# Patient Record
Sex: Female | Born: 1955 | Race: Black or African American | Hispanic: No | Marital: Married | Smoking: Former smoker
Health system: Southern US, Community
[De-identification: ages and names within clinical notes are randomized; demographics above are authoritative.]

## PROBLEM LIST (undated history)

## (undated) DIAGNOSIS — D219 Benign neoplasm of connective and other soft tissue, unspecified: Secondary | ICD-10-CM

## (undated) DIAGNOSIS — E785 Hyperlipidemia, unspecified: Secondary | ICD-10-CM

## (undated) DIAGNOSIS — T7840XA Allergy, unspecified, initial encounter: Secondary | ICD-10-CM

## (undated) DIAGNOSIS — J45909 Unspecified asthma, uncomplicated: Secondary | ICD-10-CM

## (undated) DIAGNOSIS — G4700 Insomnia, unspecified: Secondary | ICD-10-CM

## (undated) DIAGNOSIS — B029 Zoster without complications: Secondary | ICD-10-CM

## (undated) DIAGNOSIS — E079 Disorder of thyroid, unspecified: Secondary | ICD-10-CM

## (undated) DIAGNOSIS — M1990 Unspecified osteoarthritis, unspecified site: Secondary | ICD-10-CM

## (undated) DIAGNOSIS — T8859XA Other complications of anesthesia, initial encounter: Secondary | ICD-10-CM

## (undated) DIAGNOSIS — Z972 Presence of dental prosthetic device (complete) (partial): Secondary | ICD-10-CM

## (undated) DIAGNOSIS — M8580 Other specified disorders of bone density and structure, unspecified site: Secondary | ICD-10-CM

## (undated) DIAGNOSIS — T4145XA Adverse effect of unspecified anesthetic, initial encounter: Secondary | ICD-10-CM

## (undated) DIAGNOSIS — E039 Hypothyroidism, unspecified: Secondary | ICD-10-CM

## (undated) DIAGNOSIS — D649 Anemia, unspecified: Secondary | ICD-10-CM

## (undated) DIAGNOSIS — I10 Essential (primary) hypertension: Secondary | ICD-10-CM

## (undated) HISTORY — DX: Disorder of thyroid, unspecified: E07.9

## (undated) HISTORY — DX: Unspecified asthma, uncomplicated: J45.909

## (undated) HISTORY — PX: TUBAL LIGATION: SHX77

## (undated) HISTORY — PX: ADENOIDECTOMY: SUR15

## (undated) HISTORY — DX: Other specified disorders of bone density and structure, unspecified site: M85.80

## (undated) HISTORY — DX: Insomnia, unspecified: G47.00

## (undated) HISTORY — DX: Zoster without complications: B02.9

## (undated) HISTORY — DX: Hyperlipidemia, unspecified: E78.5

## (undated) HISTORY — DX: Benign neoplasm of connective and other soft tissue, unspecified: D21.9

## (undated) HISTORY — DX: Allergy, unspecified, initial encounter: T78.40XA

---
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
## 2003-11-16 HISTORY — PX: ABDOMINAL HYSTERECTOMY: SHX81

## 2008-08-29 ENCOUNTER — Ambulatory Visit: Payer: Self-pay | Admitting: Family Medicine

## 2010-09-06 ENCOUNTER — Emergency Department: Payer: Self-pay | Admitting: Emergency Medicine

## 2011-02-02 ENCOUNTER — Ambulatory Visit: Payer: Self-pay | Admitting: Family Medicine

## 2011-02-03 LAB — HM MAMMOGRAPHY: HM Mammogram: NORMAL

## 2012-08-22 LAB — HM PAP SMEAR: HM Pap smear: NORMAL

## 2013-06-25 LAB — HM COLONOSCOPY: HM Colonoscopy: NORMAL

## 2013-11-12 ENCOUNTER — Other Ambulatory Visit (INDEPENDENT_AMBULATORY_CARE_PROVIDER_SITE_OTHER): Payer: Self-pay | Admitting: Otolaryngology

## 2013-11-12 DIAGNOSIS — J329 Chronic sinusitis, unspecified: Secondary | ICD-10-CM

## 2013-11-14 ENCOUNTER — Ambulatory Visit
Admission: RE | Admit: 2013-11-14 | Discharge: 2013-11-14 | Disposition: A | Discharge status: Home / Self Care | Payer: BC Managed Care – PPO | Source: Ambulatory Visit | Attending: Otolaryngology | Admitting: Otolaryngology

## 2013-11-14 DIAGNOSIS — J329 Chronic sinusitis, unspecified: Secondary | ICD-10-CM

## 2015-07-23 ENCOUNTER — Other Ambulatory Visit: Payer: Self-pay | Admitting: Family Medicine

## 2015-07-24 ENCOUNTER — Other Ambulatory Visit: Payer: Self-pay

## 2015-07-24 MED ORDER — MONTELUKAST SODIUM 10 MG PO TABS: 10.0000 mg | ORAL_TABLET | Freq: Every day | ORAL | Status: DC

## 2015-09-19 ENCOUNTER — Other Ambulatory Visit
Admission: RE | Admit: 2015-09-19 | Discharge: 2015-09-19 | Disposition: A | Discharge status: Home / Self Care | Payer: BC Managed Care – PPO | Source: Ambulatory Visit | Attending: Family Medicine | Admitting: Family Medicine

## 2015-09-19 ENCOUNTER — Encounter: Payer: Self-pay | Admitting: Family Medicine

## 2015-09-19 ENCOUNTER — Ambulatory Visit (INDEPENDENT_AMBULATORY_CARE_PROVIDER_SITE_OTHER): Payer: BC Managed Care – PPO | Admitting: Family Medicine

## 2015-09-19 VITALS — BP 118/62 | HR 81 | Temp 98.1°F | Resp 16 | Ht 63.0 in | Wt 118.0 lb

## 2015-09-19 DIAGNOSIS — J4540 Moderate persistent asthma, uncomplicated: Secondary | ICD-10-CM | POA: Diagnosis not present

## 2015-09-19 DIAGNOSIS — R0789 Other chest pain: Secondary | ICD-10-CM | POA: Insufficient documentation

## 2015-09-19 DIAGNOSIS — J302 Other seasonal allergic rhinitis: Secondary | ICD-10-CM

## 2015-09-19 DIAGNOSIS — J309 Allergic rhinitis, unspecified: Secondary | ICD-10-CM

## 2015-09-19 DIAGNOSIS — Z23 Encounter for immunization: Secondary | ICD-10-CM | POA: Diagnosis not present

## 2015-09-19 DIAGNOSIS — E038 Other specified hypothyroidism: Secondary | ICD-10-CM | POA: Insufficient documentation

## 2015-09-19 DIAGNOSIS — Z13220 Encounter for screening for lipoid disorders: Secondary | ICD-10-CM | POA: Diagnosis not present

## 2015-09-19 DIAGNOSIS — E039 Hypothyroidism, unspecified: Secondary | ICD-10-CM | POA: Insufficient documentation

## 2015-09-19 DIAGNOSIS — J3089 Other allergic rhinitis: Secondary | ICD-10-CM

## 2015-09-19 DIAGNOSIS — F325 Major depressive disorder, single episode, in full remission: Secondary | ICD-10-CM | POA: Insufficient documentation

## 2015-09-19 LAB — CBC WITH DIFFERENTIAL/PLATELET
Basophils Absolute: 0 10*3/uL (ref 0–0.1)
Basophils Relative: 1 %
Eosinophils Absolute: 0.4 10*3/uL (ref 0–0.7)
Eosinophils Relative: 9 %
HCT: 35.3 % (ref 35.0–47.0)
Hemoglobin: 11.3 g/dL — ABNORMAL LOW (ref 12.0–16.0)
Lymphocytes Relative: 50 %
Lymphs Abs: 2.1 10*3/uL (ref 1.0–3.6)
MCH: 26.7 pg (ref 26.0–34.0)
MCHC: 32.1 g/dL (ref 32.0–36.0)
MCV: 82.9 fL (ref 80.0–100.0)
Monocytes Absolute: 0.2 10*3/uL (ref 0.2–0.9)
Monocytes Relative: 5 %
Neutro Abs: 1.5 10*3/uL (ref 1.4–6.5)
Neutrophils Relative %: 35 %
Platelets: 192 10*3/uL (ref 150–440)
RBC: 4.25 MIL/uL (ref 3.80–5.20)
RDW: 13.7 % (ref 11.5–14.5)
WBC: 4.2 10*3/uL (ref 3.6–11.0)

## 2015-09-19 LAB — COMPREHENSIVE METABOLIC PANEL
ALT: 19 U/L (ref 14–54)
AST: 28 U/L (ref 15–41)
Albumin: 4.3 g/dL (ref 3.5–5.0)
Alkaline Phosphatase: 47 U/L (ref 38–126)
Anion gap: 8 (ref 5–15)
BUN: 11 mg/dL (ref 6–20)
CO2: 28 mmol/L (ref 22–32)
Calcium: 9.2 mg/dL (ref 8.9–10.3)
Chloride: 106 mmol/L (ref 101–111)
Creatinine, Ser: 0.87 mg/dL (ref 0.44–1.00)
GFR calc Af Amer: >60 mL/min (ref >60)
GFR calc non Af Amer: >60 mL/min (ref >60)
Glucose, Bld: 103 mg/dL — ABNORMAL HIGH (ref 65–99)
Potassium: 3.3 mmol/L — ABNORMAL LOW (ref 3.5–5.1)
Sodium: 142 mmol/L (ref 135–145)
Total Bilirubin: 0.4 mg/dL (ref 0.3–1.2)
Total Protein: 7.6 g/dL (ref 6.5–8.1)

## 2015-09-19 LAB — LIPID PANEL
Cholesterol: 267 mg/dL — ABNORMAL HIGH (ref 0–200)
HDL: 91 mg/dL (ref >40)
LDL Cholesterol: 163 mg/dL — ABNORMAL HIGH (ref 0–99)
Total CHOL/HDL Ratio: 2.9 RATIO
Triglycerides: 64 mg/dL (ref <150)
VLDL: 13 mg/dL (ref 0–40)

## 2015-09-19 LAB — TSH: TSH: 6.324 u[IU]/mL — ABNORMAL HIGH (ref 0.350–4.500)

## 2015-09-19 LAB — TROPONIN I: Troponin I: <0.03 ng/mL (ref <0.031)

## 2015-09-19 LAB — CKMB (ARMC ONLY): CK, MB: 1.5 ng/mL (ref 0.5–5.0)

## 2015-09-19 LAB — CK: Total CK: 183 U/L (ref 38–234)

## 2015-09-19 MED ORDER — ALBUTEROL SULFATE HFA 108 (90 BASE) MCG/ACT IN AERS: 2.0000 | INHALATION_SPRAY | Freq: Four times a day (QID) | RESPIRATORY_TRACT | Status: DC | PRN

## 2015-09-19 MED ORDER — LORATADINE 10 MG PO TABS: 10.0000 mg | ORAL_TABLET | Freq: Every day | ORAL | Status: DC

## 2015-09-19 MED ORDER — MONTELUKAST SODIUM 10 MG PO TABS: 10.0000 mg | ORAL_TABLET | Freq: Every day | ORAL | Status: DC

## 2015-09-19 MED ORDER — ALBUTEROL SULFATE (2.5 MG/3ML) 0.083% IN NEBU
2.5000 mg | INHALATION_SOLUTION | Freq: Once | RESPIRATORY_TRACT | Status: AC
  Administered 2015-09-19: 2.5 mg via RESPIRATORY_TRACT

## 2015-09-19 MED ORDER — FLUTICASONE FUROATE-VILANTEROL 100-25 MCG/INH IN AEPB: 1.0000 | INHALATION_SPRAY | Freq: Every day | RESPIRATORY_TRACT | Status: DC

## 2015-09-19 NOTE — Progress Notes (Signed)
Name: Miranda Stafford   MRN: [id number]    DOB: 08-Feb-1956   Date:09/19/2015       Progress Note  Subjective  Chief Complaint  Chief Complaint  Patient presents with  . URI    onset 3 days coughing, SOB,nausea,chest tightness and achy    HPI  Chest pain: she has noticed that over the past couple of months her asthma has been worse, getting up in the middle of the night with dry cough or SOB and has to use a rescue inhaler, SOB with activity such as walking at work, she also has noticed nocturnal wheezing. She was outdoors in an [employer] two days ago and when she got back home, she was having chest tightness, SOB, wheezing, pain on her shoulder blades and back. She did not go to the [facility]. She states she was feeling disoriented, nausea, and had some diaphoresis. She did not try her inhaler because she was worried about having a heart attack. No edema. Explained the importance of going to [facility] if symptoms recurs.    09/19/15 0905  Asthma History  Symptoms Daily  Nighttime Awakenings >1/wk but not nightly  Asthma interference with normal activity Some limitations  SABA use (not for EIB) Daily  Risk: Exacerbations requiring oral systemic steroids 0-1 / year  Asthma Severity Moderate Persistent   Hypothyroidism: she has been taking her medication three times weekly on M, W, F otherwise she felt it was too much for her, because it was causing palpitation and some anxiety at a higher dose  AR: having nasal congestion, post-nasal drainage, no sneezing, but has an itchy throat  Patient Active Problem List   Diagnosis Date Noted  . Hypothyroidism 09/19/2015  . Asthma, moderate persistent, poorly-controlled 09/19/2015  . Perennial allergic rhinitis with seasonal variation 09/19/2015  . Major depression in remission ([place]) 09/19/2015    Past Surgical History  Procedure Laterality Date  . Abdominal hysterectomy      Family History  Problem Relation Age of Onset  . Heart disease Mother   .  Hypertension Mother   . Hyperlipidemia Mother     Social History   Social History  . Marital Status: Married    Spouse Name: N/A  . Number of Children: N/A  . Years of Education: N/A   Occupational History  . Not on file.   Social History Main Topics  . Smoking status: Former Smoker -- 0.50 packs/day for 5 years    Types: Cigarettes    Quit date: 09/18/1985  . Smokeless tobacco: Never Used  . Alcohol Use: 0.0 oz/week    0 Standard drinks or equivalent per week     Comment: wine  . Drug Use: No  . Sexual Activity: Yes   Other Topics Concern  . Not on file   Social History Narrative  . No narrative on file     Current outpatient prescriptions:  .  montelukast (SINGULAIR) 10 MG tablet, Take 1 tablet (10 mg total) by mouth at bedtime., Disp: 30 tablet, Rfl: 0 .  SYNTHROID 125 MCG tablet, Place 1 tablet into the left eye daily., Disp: , Rfl: 0  No Known Allergies   ROS  Constitutional: Negative for fever or weight change.  Respiratory: Positive for cough and shortness of breath.   Cardiovascular: Positive  for chest pain no  palpitations.  Gastrointestinal: Negative for abdominal pain, no bowel changes.  Musculoskeletal: Negative for gait problem or joint swelling.  Skin: Negative for rash.  Neurological: Negative for  dizziness or headache.  No other specific complaints in a complete review of systems (except as listed in HPI above). Objective  Filed Vitals:   09/19/15 0823  BP: 118/62  Pulse: 81  Temp: 98.1 F (36.7 C)  TempSrc: Oral  Resp: 16  Height: 5\' 3"  (1.6 m)  Weight: 118 lb (53.524 kg)  SpO2: 96%    Body mass index is 20.91 kg/(m^2).  Physical Exam  Constitutional: Patient appears well-developed and well-nourished.  No distress.  HEENT: head atraumatic, normocephalic, pupils equal and reactive to light,  neck supple, throat within normal limits Cardiovascular: Normal rate, regular rhythm and normal heart sounds.  No murmur heard. No BLE  edema. Pulmonary/Chest: Effort normal and breath sounds normal. No respiratory distress. Abdominal: Soft.  There is no tenderness. Psychiatric: Patient has a normal mood and affect. behavior is normal. Judgment and thought content normal.  PHQ2/9: Depression screen [facility] 2/9 09/19/2015 09/19/2015  Decreased Interest 0 0  Down, Depressed, Hopeless 0 0  PHQ - 2 Score 0 0    Fall Risk: Fall Risk  09/19/2015  Falls in the past year? No    Functional Status Survey: Is the patient deaf or have difficulty hearing?: No Does the patient have difficulty seeing, even when wearing glasses/contacts?: Yes (glasses) Does the patient have difficulty concentrating, remembering, or making decisions?: No Does the patient have difficulty walking or climbing stairs?: No Does the patient have difficulty dressing or bathing?: No Does the patient have difficulty doing errands alone such as visiting a doctor's office or shopping?: No   Assessment & Plan  1. Chest tightness  Likely from asthma exacerbation since great response to albuterol therapy, but we will check cardiac enzymes to rule out Mi two days ago. If positive enzymes refer to Cardiologist - EKG 12-Lead - Troponin I - Comprehensive metabolic panel - CBC with Differential/Platelet - CK total and CKMB (cardiac)not at [facility]  2. Other specified hypothyroidism  - TSH  3. Asthma, moderate persistent, poorly-controlled  Start maintenance medication and follow up in 1 month - montelukast (SINGULAIR) 10 MG tablet; Take 1 tablet (10 mg total) by mouth at bedtime.  Dispense: 30 tablet; Refill: 5 - Fluticasone Furoate-Vilanterol (BREO ELLIPTA) 100-25 MCG/INH AEPB; Inhale 1 puff into the lungs daily.  Dispense: 60 each; Refill: 2 - albuterol (PROVENTIL HFA;VENTOLIN HFA) 108 (90 BASE) MCG/ACT inhaler; Inhale 2 puffs into the lungs every 6 (six) hours as needed for wheezing or shortness of breath.  Dispense: 1 Inhaler; Refill: 1  4. Lipid screening  -  Lipid panel  5. Needs flu shot  - Flu Vaccine QUAD 36+ mos IM -refused  6. Need for pneumococcal vaccination  - Pneumococcal polysaccharide vaccine 23-valent greater than or equal to 2yo subcutaneous/IM -refused  7. Perennial allergic rhinitis with seasonal variation  - loratadine (CLARITIN) 10 MG tablet; Take 1 tablet (10 mg total) by mouth daily.  Dispense: 30 tablet; Refill: 11

## 2015-09-21 ENCOUNTER — Other Ambulatory Visit: Payer: Self-pay | Admitting: Family Medicine

## 2015-09-21 DIAGNOSIS — D649 Anemia, unspecified: Secondary | ICD-10-CM

## 2015-09-23 ENCOUNTER — Other Ambulatory Visit: Payer: Self-pay

## 2015-09-23 DIAGNOSIS — E038 Other specified hypothyroidism: Secondary | ICD-10-CM

## 2015-11-03 ENCOUNTER — Other Ambulatory Visit: Payer: Self-pay | Admitting: Family Medicine

## 2016-05-21 ENCOUNTER — Other Ambulatory Visit: Payer: Self-pay | Admitting: Family Medicine

## 2016-05-21 NOTE — Telephone Encounter (Signed)
Left voice message informing patient that prescriptions has been sent to pharmacy and for her to call the office to schedule appointment.

## 2016-05-24 ENCOUNTER — Encounter: Payer: Self-pay | Admitting: Family Medicine

## 2016-05-24 ENCOUNTER — Ambulatory Visit (INDEPENDENT_AMBULATORY_CARE_PROVIDER_SITE_OTHER): Payer: BC Managed Care – PPO | Admitting: Family Medicine

## 2016-05-24 VITALS — BP 110/72 | HR 65 | Temp 98.5°F | Resp 16 | Ht 63.0 in | Wt 116.6 lb

## 2016-05-24 DIAGNOSIS — Z803 Family history of malignant neoplasm of breast: Secondary | ICD-10-CM

## 2016-05-24 DIAGNOSIS — Z13220 Encounter for screening for lipoid disorders: Secondary | ICD-10-CM

## 2016-05-24 DIAGNOSIS — E038 Other specified hypothyroidism: Secondary | ICD-10-CM | POA: Diagnosis not present

## 2016-05-24 DIAGNOSIS — Z79899 Other long term (current) drug therapy: Secondary | ICD-10-CM | POA: Diagnosis not present

## 2016-05-24 DIAGNOSIS — D649 Anemia, unspecified: Secondary | ICD-10-CM

## 2016-05-24 DIAGNOSIS — J4540 Moderate persistent asthma, uncomplicated: Secondary | ICD-10-CM | POA: Diagnosis not present

## 2016-05-24 DIAGNOSIS — Z1239 Encounter for other screening for malignant neoplasm of breast: Secondary | ICD-10-CM

## 2016-05-24 MED ORDER — FLUTICASONE FUROATE-VILANTEROL 100-25 MCG/INH IN AEPB: 1.0000 | INHALATION_SPRAY | Freq: Every day | RESPIRATORY_TRACT | Status: DC

## 2016-05-24 MED ORDER — MONTELUKAST SODIUM 10 MG PO TABS
10.0000 mg | ORAL_TABLET | Freq: Every day | ORAL | Status: DC
Start: 2016-05-24 — End: 2017-02-10

## 2016-05-24 NOTE — Progress Notes (Signed)
Name: Miranda Stafford   MRN: [id number]    DOB: 1956-07-11   Date:05/24/2016       Progress Note  Subjective  Chief Complaint  Chief Complaint  Patient presents with  . Thyroid Problem  . Labs Only  . Medication Refill    singular  . Orders    mammogram    HPI  Hypothyroidism: she has been taking her medication three times weekly on M, W, F otherwise she felt it was too much for her, because it was causing palpitation, body aches  and some anxiety at a higher dose. She is on 125 mcg daily. She denies diarrhea or dry skin  AR: she denies  nasal congestion,or sneezing, she has some am nasal drainage. Taking medication as prescribed. Occasionally post-nasal drainage and itchy throat at night.   Family history of breast cancer: her older sister was diagnosed with breast cancer 11 years ago and had recurrence 3 years ago, over the past few months she had another recurrence and is now at a hospice home. She has been taking care of her and is sad to see her sister dying. Explained importance of getting her own mammogram done.   Asthma Mild persistent: she has some SOB a few nights a week, only using an inhaler - that is brown ( possibly Qvar )  about 3 times weekly, explained that medication only lasts 60 days. She needs to call me back with the name of medication, she states [facility] was too expensive   Patient Active Problem List   Diagnosis Date Noted  . Family history of breast cancer in sister 05/24/2016  . Hypothyroidism 09/19/2015  . Asthma, moderate persistent, poorly-controlled 09/19/2015  . Perennial allergic rhinitis with seasonal variation 09/19/2015  . Major depression in remission ([place]) 09/19/2015    Past Surgical History  Procedure Laterality Date  . Abdominal hysterectomy    . Adenoidectomy    . Cesarean section    . Tubal ligation      Family History  Problem Relation Age of Onset  . Heart disease Mother   . Hypertension Mother   . Hyperlipidemia Mother   . SIDS  Son     Social History   Social History  . Marital Status: Married    Spouse Name: N/A  . Number of Children: N/A  . Years of Education: N/A   Occupational History  . Not on file.   Social History Main Topics  . Smoking status: Former Smoker -- 0.50 packs/day for 5 years    Types: Cigarettes    Quit date: 09/18/1985  . Smokeless tobacco: Never Used  . Alcohol Use: 0.0 oz/week    0 Standard drinks or equivalent per week     Comment: wine  . Drug Use: No  . Sexual Activity: Yes   Other Topics Concern  . Not on file   Social History Narrative     Current outpatient prescriptions:  .  albuterol (PROVENTIL HFA;VENTOLIN HFA) 108 (90 BASE) MCG/ACT inhaler, Inhale 2 puffs into the lungs every 6 (six) hours as needed for wheezing or shortness of breath., Disp: 1 Inhaler, Rfl: 1 .  fluticasone furoate-vilanterol (BREO ELLIPTA) 100-25 MCG/INH AEPB, Inhale 1 puff into the lungs daily., Disp: 60 each, Rfl: 2 .  loratadine (CLARITIN) 10 MG tablet, Take 1 tablet (10 mg total) by mouth daily., Disp: 30 tablet, Rfl: 11 .  SYNTHROID 125 MCG tablet, take 1 tablet by mouth once daily, Disp: 30 tablet, Rfl: 0 .  montelukast (  SINGULAIR) 10 MG tablet, Take 1 tablet (10 mg total) by mouth at bedtime., Disp: 30 tablet, Rfl: 5  No Known Allergies   ROS  Constitutional: Negative for fever or weight change.  Respiratory: Negative for cough and shortness of breath.   Cardiovascular: Negative for chest pain or palpitations.  Gastrointestinal: Negative for abdominal pain, no bowel changes.  Musculoskeletal: Negative for gait problem or joint swelling.  Skin: Negative for rash.  Neurological: Negative for dizziness or headache.  No other specific complaints in a complete review of systems (except as listed in HPI above).  Objective  Filed Vitals:   05/24/16 0856  BP: 110/72  Pulse: 65  Temp: 98.5 F (36.9 C)  TempSrc: Oral  Resp: 16  Height: 5\' 3"  (1.6 m)  Weight: 116 lb 9.6 oz  (52.889 kg)  SpO2: 96%    Body mass index is 20.66 kg/(m^2).  Physical Exam  Constitutional: Patient appears well-developed and well-nourished.  No distress.  HEENT: head atraumatic, normocephalic, pupils equal and reactive to light,  neck supple, throat within normal limits Cardiovascular: Normal rate, regular rhythm and normal heart sounds.  No murmur heard. No BLE edema. Pulmonary/Chest: Effort normal and breath sounds normal. No respiratory distress. Abdominal: Soft.  There is no tenderness. Psychiatric: Patient has a normal mood and affect. behavior is normal. Judgment and thought content normal.  PHQ2/9: Depression screen [facility] 2/9 05/24/2016 09/19/2015 09/19/2015  Decreased Interest 0 0 0  Down, Depressed, Hopeless 0 0 0  PHQ - 2 Score 0 0 0    Fall Risk: Fall Risk  05/24/2016 09/19/2015  Falls in the past year? No No    Functional Status Survey: Is the patient deaf or have difficulty hearing?: No Does the patient have difficulty seeing, even when wearing glasses/contacts?: No Does the patient have difficulty concentrating, remembering, or making decisions?: No Does the patient have difficulty walking or climbing stairs?: No Does the patient have difficulty dressing or bathing?: No Does the patient have difficulty doing errands alone such as visiting a doctor's office or shopping?: No    Assessment & Plan  1. Other specified hypothyroidism  - TSH  2. Family history of breast cancer in sister  Needs to have mammogram done  3. Asthma, moderate persistent, poorly-controlled  - montelukast (SINGULAIR) 10 MG tablet; Take 1 tablet (10 mg total) by mouth at bedtime.  Dispense: 30 tablet; Refill: 5  4. Lipid screening  - Lipid panel  5. Anemia, unspecified anemia type  - CBC with Differential/Platelet  6. Encounter for long-term (current) use of medications  - Comprehensive metabolic panel  7. Breast cancer screening  - MM Digital Screening; Future

## 2016-06-10 ENCOUNTER — Encounter: Payer: Self-pay | Admitting: Emergency Medicine

## 2016-06-10 ENCOUNTER — Ambulatory Visit: Payer: BC Managed Care – PPO

## 2016-06-10 ENCOUNTER — Emergency Department
Admission: EM | Admit: 2016-06-10 | Discharge: 2016-06-10 | Disposition: A | Discharge status: Home / Self Care | Payer: BC Managed Care – PPO | Attending: Emergency Medicine | Admitting: Emergency Medicine

## 2016-06-10 DIAGNOSIS — J4540 Moderate persistent asthma, uncomplicated: Secondary | ICD-10-CM | POA: Diagnosis not present

## 2016-06-10 DIAGNOSIS — Y9389 Activity, other specified: Secondary | ICD-10-CM | POA: Diagnosis not present

## 2016-06-10 DIAGNOSIS — W268XXA Contact with other sharp object(s), not elsewhere classified, initial encounter: Secondary | ICD-10-CM | POA: Diagnosis not present

## 2016-06-10 DIAGNOSIS — S61011A Laceration without foreign body of right thumb without damage to nail, initial encounter: Secondary | ICD-10-CM | POA: Diagnosis present

## 2016-06-10 DIAGNOSIS — Y929 Unspecified place or not applicable: Secondary | ICD-10-CM | POA: Insufficient documentation

## 2016-06-10 DIAGNOSIS — Y999 Unspecified external cause status: Secondary | ICD-10-CM | POA: Insufficient documentation

## 2016-06-10 DIAGNOSIS — Z87891 Personal history of nicotine dependence: Secondary | ICD-10-CM | POA: Insufficient documentation

## 2016-06-10 DIAGNOSIS — S61219A Laceration without foreign body of unspecified finger without damage to nail, initial encounter: Secondary | ICD-10-CM

## 2016-06-10 MED ORDER — BACITRACIN ZINC 500 UNIT/GM EX OINT
1.0000 "application " | TOPICAL_OINTMENT | Freq: Two times a day (BID) | CUTANEOUS | Status: DC
  Filled 2016-06-10: qty 0.9

## 2016-06-10 MED ORDER — LIDOCAINE HCL (PF) 1 % IJ SOLN
5.0000 mL | Freq: Once | INTRAMUSCULAR | Status: AC
  Administered 2016-06-10: 5 mL via INTRADERMAL
  Filled 2016-06-10: qty 5

## 2016-06-10 MED ORDER — TRAMADOL HCL 50 MG PO TABS: 50.0000 mg | ORAL_TABLET | Freq: Four times a day (QID) | ORAL | 0 refills | Status: DC | PRN

## 2016-06-10 NOTE — ED Provider Notes (Signed)
[facility] Emergency Department Provider Note  ____________________________________________  Time seen: Approximately 3:02 PM  I have reviewed the triage vital signs and the nursing notes.   HISTORY  Chief Complaint Extremity Laceration   HPI Miranda Stafford is a 60 y.o. female who presents to the emergency department for evaluation of a laceration to the right thumb. She cut her right thumb while opening a can of soup. Active bleeding present. Tetanus vaccination is current.  Past Medical History:  Diagnosis Date  . Allergy   . Asthma   . Depression   . Fibroid tumor   . Herpes zoster   . Hyperlipidemia   . Insomnia   . Osteopenia   . Thyroid disease     Patient Active Problem List   Diagnosis Date Noted  . Family history of breast cancer in sister 05/24/2016  . Hypothyroidism 09/19/2015  . Asthma, moderate persistent, poorly-controlled 09/19/2015  . Perennial allergic rhinitis with seasonal variation 09/19/2015  . Major depression in remission ([place]) 09/19/2015    Past Surgical History:  Procedure Laterality Date  . ABDOMINAL HYSTERECTOMY    . ADENOIDECTOMY    . CESAREAN SECTION    . TUBAL LIGATION      Current Outpatient Rx  . Order #: [id number] Class: Normal  . Order #: [id number] Class: Normal  . Order #: [id number] Class: Normal  . Order #: [id number] Class: Normal  . Order #: [id number] Class: Print    Allergies Review of patient's allergies indicates no known allergies.  Family History  Problem Relation Age of Onset  . Heart disease Mother   . Hypertension Mother   . Hyperlipidemia Mother   . SIDS Son     Social History Social History  Substance Use Topics  . Smoking status: Former Smoker    Packs/day: 0.50    Years: 5.00    Types: Cigarettes    Quit date: 09/18/1985  . Smokeless tobacco: Never Used  . Alcohol use 0.0 oz/week     Comment: wine    Review of Systems  Constitutional: Well appearing. Respiratory:  Negative for shortness of breath. Musculoskeletal: Positive for pain. Skin: Positive for laceration Neurological: Negative for headaches, focal weakness or numbness. ____________________________________________   PHYSICAL EXAM:  VITAL SIGNS: ED Triage Vitals  Enc Vitals Group     BP 06/10/16 1434 117/69     Pulse Rate 06/10/16 1434 81     Resp 06/10/16 1434 18     Temp 06/10/16 1434 97.5 F (36.4 C)     Temp Source 06/10/16 1434 Oral     SpO2 06/10/16 1434 100 %     Weight 06/10/16 1434 118 lb (53.5 kg)     Height 06/10/16 1434 5\' 3"  (1.6 m)     Head Circumference --      Peak Flow --      Pain Score 06/10/16 1435 7     Pain Loc --      Pain Edu? --      Excl. in [place]? --      Constitutional: Alert and oriented. Well appearing and in no acute distress. Eyes: Conjunctivae are normal. EOMI. Nose: No congestion/rhinnorhea. Mouth/Throat: Mucous membranes are moist.   Neck: No stridor. Cardiovascular: Good peripheral circulation. Respiratory: Normal respiratory effort.  No retractions. Musculoskeletal: FROM throughout, specifically the right thumb. Neurologic:  Normal speech and language. No gross focal neurologic deficits are appreciated. Skin:  2.5 cm laceration to the lateral aspect of the right thumb extending up the finger pad and to  the tip of the thumb.  ____________________________________________   LABS (all labs ordered are listed, but only abnormal results are displayed)  Labs Reviewed - No data to display ____________________________________________  EKG   ____________________________________________  RADIOLOGY  Not indicated. ____________________________________________   PROCEDURES  Procedure(s) performed:   LACERATION REPAIR Performed by: Sherrie George  Consent: Verbal consent obtained.  Consent given by: patient  Prepped and Draped in normal sterile fashion  Wound explored: Negative for foreign bodies   Laceration Location: right  thumb  Laceration Length: 2.5cm  Local anesthetic: lidocaine 1% without epinephrine  Anesthetic total: 3.5 ml  Irrigation method: syringe  Amount of cleaning: Standard  Skin closure: 5-0 Nylon  Number of sutures: 6  Technique: Simple interrupted.  Patient tolerance: Patient tolerated the procedure well with no immediate complications.  ____________________________________________   INITIAL IMPRESSION / ASSESSMENT AND PLAN / ED COURSE  Pertinent labs & imaging results that were available during my care of the patient were reviewed by me and considered in my medical decision making (see chart for details).  She was advised to keep the sutured area clean and dry for the next 24 hours. She was then advised that she may perform her ADLs as usual, except for soaking her hand in water. She was advised to follow-up with her primary care provider in 10 days to have sutures removed. She is to return to the ER for symptoms of concern if unable to see her PCP right away.   New Prescriptions   TRAMADOL (ULTRAM) 50 MG TABLET    Take 1 tablet (50 mg total) by mouth every 6 (six) hours as needed.   ____________________________________________   FINAL CLINICAL IMPRESSION(S) / ED DIAGNOSES  Final diagnoses:  Laceration of finger, initial encounter    New Prescriptions   TRAMADOL (ULTRAM) 50 MG TABLET    Take 1 tablet (50 mg total) by mouth every 6 (six) hours as needed.      Victorino Dike, FNP 06/10/16 1634    Hinda Kehr, MD 06/10/16 2321

## 2016-06-10 NOTE — ED Triage Notes (Signed)
Patient presents to the ED with a laceration to her right thumb after cutting thumb on a soup can.  Laceration is about 1.5-2inches extending beyond the most distal thumb joint.  Blood is oozing from wound.

## 2016-06-10 NOTE — ED Notes (Signed)
See triage note.laceration noted to right thumb from soup can

## 2016-06-23 ENCOUNTER — Other Ambulatory Visit: Payer: Self-pay | Admitting: Family Medicine

## 2016-06-23 ENCOUNTER — Ambulatory Visit
Admission: RE | Admit: 2016-06-23 | Discharge: 2016-06-23 | Disposition: A | Discharge status: Home / Self Care | Payer: BC Managed Care – PPO | Source: Ambulatory Visit | Attending: Family Medicine | Admitting: Family Medicine

## 2016-06-23 DIAGNOSIS — Z1239 Encounter for other screening for malignant neoplasm of breast: Secondary | ICD-10-CM | POA: Diagnosis not present

## 2016-06-23 DIAGNOSIS — Z1231 Encounter for screening mammogram for malignant neoplasm of breast: Secondary | ICD-10-CM | POA: Diagnosis present

## 2016-10-18 ENCOUNTER — Ambulatory Visit (INDEPENDENT_AMBULATORY_CARE_PROVIDER_SITE_OTHER): Payer: BC Managed Care – PPO | Admitting: Family Medicine

## 2016-10-18 ENCOUNTER — Encounter: Payer: Self-pay | Admitting: Family Medicine

## 2016-10-18 VITALS — BP 116/78 | HR 81 | Temp 98.9°F | Resp 16 | Ht 63.0 in | Wt 121.2 lb

## 2016-10-18 DIAGNOSIS — Z79899 Other long term (current) drug therapy: Secondary | ICD-10-CM

## 2016-10-18 DIAGNOSIS — E038 Other specified hypothyroidism: Secondary | ICD-10-CM | POA: Diagnosis not present

## 2016-10-18 DIAGNOSIS — R058 Other specified cough: Secondary | ICD-10-CM

## 2016-10-18 DIAGNOSIS — R05 Cough: Secondary | ICD-10-CM | POA: Diagnosis not present

## 2016-10-18 DIAGNOSIS — J4540 Moderate persistent asthma, uncomplicated: Secondary | ICD-10-CM | POA: Diagnosis not present

## 2016-10-18 DIAGNOSIS — J069 Acute upper respiratory infection, unspecified: Secondary | ICD-10-CM | POA: Diagnosis not present

## 2016-10-18 DIAGNOSIS — B9789 Other viral agents as the cause of diseases classified elsewhere: Secondary | ICD-10-CM

## 2016-10-18 LAB — COMPLETE METABOLIC PANEL WITH GFR
ALT: 20 U/L (ref 6–29)
AST: 28 U/L (ref 10–35)
Albumin: 4.6 g/dL (ref 3.6–5.1)
Alkaline Phosphatase: 60 U/L (ref 33–130)
BUN: 9 mg/dL (ref 7–25)
CO2: 29 mmol/L (ref 20–31)
Calcium: 9.7 mg/dL (ref 8.6–10.4)
Chloride: 104 mmol/L (ref 98–110)
Creat: 0.95 mg/dL (ref 0.50–1.05)
GFR, Est African American: 76 mL/min (ref >=60)
GFR, Est Non African American: 66 mL/min (ref >=60)
Glucose, Bld: 86 mg/dL (ref 65–99)
Potassium: 4.2 mmol/L (ref 3.5–5.3)
Sodium: 141 mmol/L (ref 135–146)
Total Bilirubin: 0.3 mg/dL (ref 0.2–1.2)
Total Protein: 7.8 g/dL (ref 6.1–8.1)

## 2016-10-18 LAB — TSH: TSH: 6.84 mIU/L — ABNORMAL HIGH

## 2016-10-18 MED ORDER — ALBUTEROL SULFATE HFA 108 (90 BASE) MCG/ACT IN AERS: 2.0000 | INHALATION_SPRAY | Freq: Four times a day (QID) | RESPIRATORY_TRACT | 0 refills | Status: DC | PRN

## 2016-10-18 MED ORDER — FLUTICASONE FUROATE-VILANTEROL 100-25 MCG/INH IN AEPB: 1.0000 | INHALATION_SPRAY | Freq: Every day | RESPIRATORY_TRACT | 0 refills | Status: DC

## 2016-10-18 MED ORDER — AZITHROMYCIN 250 MG PO TABS: ORAL_TABLET | ORAL | 0 refills | Status: DC

## 2016-10-18 MED ORDER — PREDNISONE 20 MG PO TABS
20.0000 mg | ORAL_TABLET | Freq: Every day | ORAL | 0 refills | Status: DC
Start: 2016-10-18 — End: 2016-11-24

## 2016-10-18 NOTE — Progress Notes (Signed)
Name: Miranda Stafford   MRN: [id number]    DOB: 03-30-1956   Date:10/18/2016       Progress Note  Subjective  Chief Complaint  Chief Complaint  Patient presents with  . chest congestion    for about 2 weeks gradually getting worse pt unable to sleep at night  . Cough    productive with green and clear phelgm  . Flu Vaccine    pt refused    HPI  URI/asthma flare she states symptoms started with nasal congestion one week ago, symptoms went down to her chest, and caused difficulty breathing, chest tightness followed by a productive cough. She states she has been using Ventolin but ran out of singulair. She states Ventolin helps with symptoms. She had chills also vomiting from coughing so hard. No sick contact. She denies any wheezing, but has SOB and feels very tired. She denies edema.   Hypothyroidism: she lost to follow up, she has been taking levothyroxine only a couple of times of week, and ran out a couple of weeks ago. She did no have labs back in July   Patient Active Problem List   Diagnosis Date Noted  . Family history of breast cancer in sister 05/24/2016  . Hypothyroidism 09/19/2015  . Asthma, moderate persistent, poorly-controlled 09/19/2015  . Perennial allergic rhinitis with seasonal variation 09/19/2015  . Major depression in remission ([place]) 09/19/2015    Past Surgical History:  Procedure Laterality Date  . ABDOMINAL HYSTERECTOMY    . ADENOIDECTOMY    . CESAREAN SECTION    . TUBAL LIGATION      Family History  Problem Relation Age of Onset  . Heart disease Mother   . Hypertension Mother   . Hyperlipidemia Mother   . Breast cancer Mother 90  . SIDS Son   . Breast cancer Sister 75    Social History   Social History  . Marital status: Married    Spouse name: N/A  . Number of children: N/A  . Years of education: N/A   Occupational History  . Not on file.   Social History Main Topics  . Smoking status: Former Smoker    Packs/day: 0.50    Years: 5.00     Types: Cigarettes    Quit date: 09/18/1985  . Smokeless tobacco: Never Used  . Alcohol use 0.0 oz/week     Comment: wine  . Drug use: No  . Sexual activity: Yes   Other Topics Concern  . Not on file   Social History Narrative  . No narrative on file     Current Outpatient Prescriptions:  .  albuterol (PROVENTIL HFA;VENTOLIN HFA) 108 (90 Base) MCG/ACT inhaler, Inhale 2 puffs into the lungs every 6 (six) hours as needed for wheezing or shortness of breath., Disp: 1 Inhaler, Rfl: 0 .  azithromycin (ZITHROMAX) 250 MG tablet, Take as directed 2 first day and one daily after that, Disp: 6 tablet, Rfl: 0 .  fluticasone furoate-vilanterol (BREO ELLIPTA) 100-25 MCG/INH AEPB, Inhale 1 puff into the lungs daily., Disp: 60 each, Rfl: 0 .  loratadine (CLARITIN) 10 MG tablet, Take 1 tablet (10 mg total) by mouth daily., Disp: 30 tablet, Rfl: 11 .  montelukast (SINGULAIR) 10 MG tablet, Take 1 tablet (10 mg total) by mouth at bedtime., Disp: 30 tablet, Rfl: 5 .  predniSONE (DELTASONE) 20 MG tablet, Take 1 tablet (20 mg total) by mouth daily with breakfast., Disp: 10 tablet, Rfl: 0 .  SYNTHROID 125 MCG tablet, take 1  tablet by mouth once daily, Disp: 30 tablet, Rfl: 0  No Known Allergies   ROS  Ten systems reviewed and is negative except as mentioned in HPI   Objective  Vitals:   10/18/16 1543  BP: 116/78  Pulse: 81  Resp: 16  Temp: 98.9 F (37.2 C)  TempSrc: Oral  SpO2: 94%  Weight: 121 lb 4 oz (55 kg)  Height: 5\' 3"  (1.6 m)    Body mass index is 21.48 kg/m.  Physical Exam  Constitutional: Patient appears well-developed and well-nourished. No distress.  HEENT: head atraumatic, normocephalic, pupils equal and reactive to light, ears normal TM bilaterally, neck supple, throat within normal limits Cardiovascular: Normal rate, regular rhythm and normal heart sounds.  No murmur heard. No BLE edema. Pulmonary/Chest: Effort normal and breath sounds normal. No respiratory  distress. Abdominal: Soft.  There is no tenderness. Psychiatric: Patient has a normal mood and affect. behavior is normal. Judgment and thought content normal.   PHQ2/9: Depression screen [facility] 2/9 10/18/2016 05/24/2016 09/19/2015 09/19/2015  Decreased Interest 0 0 0 0  Down, Depressed, Hopeless 0 0 0 0  PHQ - 2 Score 0 0 0 0     Fall Risk: Fall Risk  10/18/2016 05/24/2016 09/19/2015  Falls in the past year? No No No    Functional Status Survey: Is the patient deaf or have difficulty hearing?: No Does the patient have difficulty seeing, even when wearing glasses/contacts?: No Does the patient have difficulty concentrating, remembering, or making decisions?: No Does the patient have difficulty walking or climbing stairs?: No Does the patient have difficulty dressing or bathing?: No Does the patient have difficulty doing errands alone such as visiting a doctor's office or shopping?: No   Assessment & Plan  1. Asthma, moderate persistent, poorly-controlled  - albuterol (PROVENTIL HFA;VENTOLIN HFA) 108 (90 Base) MCG/ACT inhaler; Inhale 2 puffs into the lungs every 6 (six) hours as needed for wheezing or shortness of breath.  Dispense: 1 Inhaler; Refill: 0 - predniSONE (DELTASONE) 20 MG tablet; Take 1 tablet (20 mg total) by mouth daily with breakfast.  Dispense: 10 tablet; Refill: 0 - fluticasone furoate-vilanterol (BREO ELLIPTA) 100-25 MCG/INH AEPB; Inhale 1 puff into the lungs daily.  Dispense: 60 each; Refill: 0  2. Other specified hypothyroidism  - TSH  3. Encounter for long-term (current) use of medications  - COMPLETE METABOLIC PANEL WITH GFR  4. Viral upper respiratory tract infection  Can take otc medication   5. Productive cough  - azithromycin (ZITHROMAX) 250 MG tablet; Take as directed 2 first day and one daily after that  Dispense: 6 tablet; Refill: 0

## 2016-10-19 ENCOUNTER — Other Ambulatory Visit: Payer: Self-pay | Admitting: Family Medicine

## 2016-10-19 MED ORDER — LEVOTHYROXINE SODIUM 25 MCG PO TABS: 25.0000 ug | ORAL_TABLET | Freq: Every day | ORAL | 2 refills | Status: DC

## 2016-10-21 ENCOUNTER — Telehealth: Payer: Self-pay

## 2016-10-21 NOTE — Telephone Encounter (Signed)
-----   Message from Steele Sizer, MD sent at 10/19/2016  7:50 AM EST ----- Sugar, kidney and transaminases are within normal limits TSH is elevated, I will send a different dose of Synthroid . Go down to 25 mcg and take it daily

## 2016-10-21 NOTE — Telephone Encounter (Signed)
Called and left message for patient to return my call regarding labs.

## 2016-11-24 ENCOUNTER — Emergency Department: Payer: BC Managed Care – PPO

## 2016-11-24 ENCOUNTER — Inpatient Hospital Stay
Admission: EM | Admit: 2016-11-24 | Discharge: 2016-11-25 | DRG: 194 | Disposition: A | Discharge status: Home / Self Care | Payer: BC Managed Care – PPO | Attending: Internal Medicine | Admitting: Internal Medicine

## 2016-11-24 ENCOUNTER — Encounter: Payer: Self-pay | Admitting: *Deleted

## 2016-11-24 DIAGNOSIS — J4540 Moderate persistent asthma, uncomplicated: Secondary | ICD-10-CM | POA: Diagnosis present

## 2016-11-24 DIAGNOSIS — Z87891 Personal history of nicotine dependence: Secondary | ICD-10-CM

## 2016-11-24 DIAGNOSIS — F325 Major depressive disorder, single episode, in full remission: Secondary | ICD-10-CM | POA: Diagnosis present

## 2016-11-24 DIAGNOSIS — B349 Viral infection, unspecified: Secondary | ICD-10-CM

## 2016-11-24 DIAGNOSIS — I959 Hypotension, unspecified: Secondary | ICD-10-CM | POA: Diagnosis present

## 2016-11-24 DIAGNOSIS — E860 Dehydration: Secondary | ICD-10-CM | POA: Diagnosis present

## 2016-11-24 DIAGNOSIS — Z8249 Family history of ischemic heart disease and other diseases of the circulatory system: Secondary | ICD-10-CM

## 2016-11-24 DIAGNOSIS — N179 Acute kidney failure, unspecified: Secondary | ICD-10-CM | POA: Diagnosis present

## 2016-11-24 DIAGNOSIS — J101 Influenza due to other identified influenza virus with other respiratory manifestations: Principal | ICD-10-CM | POA: Diagnosis present

## 2016-11-24 DIAGNOSIS — R112 Nausea with vomiting, unspecified: Secondary | ICD-10-CM | POA: Diagnosis not present

## 2016-11-24 DIAGNOSIS — Z90710 Acquired absence of both cervix and uterus: Secondary | ICD-10-CM

## 2016-11-24 DIAGNOSIS — E039 Hypothyroidism, unspecified: Secondary | ICD-10-CM | POA: Diagnosis present

## 2016-11-24 DIAGNOSIS — Z79899 Other long term (current) drug therapy: Secondary | ICD-10-CM

## 2016-11-24 DIAGNOSIS — Z7951 Long term (current) use of inhaled steroids: Secondary | ICD-10-CM

## 2016-11-24 LAB — LACTIC ACID, PLASMA: Lactic Acid, Venous: 1.7 mmol/L (ref 0.5–1.9)

## 2016-11-24 LAB — COMPREHENSIVE METABOLIC PANEL
ALT: 98 U/L — ABNORMAL HIGH (ref 14–54)
AST: 183 U/L — ABNORMAL HIGH (ref 15–41)
Albumin: 4.6 g/dL (ref 3.5–5.0)
Alkaline Phosphatase: 74 U/L (ref 38–126)
Anion gap: 11 (ref 5–15)
BUN: 16 mg/dL (ref 6–20)
CO2: 27 mmol/L (ref 22–32)
Calcium: 9.5 mg/dL (ref 8.9–10.3)
Chloride: 96 mmol/L — ABNORMAL LOW (ref 101–111)
Creatinine, Ser: 1.45 mg/dL — ABNORMAL HIGH (ref 0.44–1.00)
GFR calc Af Amer: 45 mL/min — ABNORMAL LOW (ref >60)
GFR calc non Af Amer: 39 mL/min — ABNORMAL LOW (ref >60)
Glucose, Bld: 111 mg/dL — ABNORMAL HIGH (ref 65–99)
Potassium: 4.1 mmol/L (ref 3.5–5.1)
Sodium: 134 mmol/L — ABNORMAL LOW (ref 135–145)
Total Bilirubin: 0.3 mg/dL (ref 0.3–1.2)
Total Protein: 9.1 g/dL — ABNORMAL HIGH (ref 6.5–8.1)

## 2016-11-24 LAB — CBC WITH DIFFERENTIAL/PLATELET
Basophils Absolute: 0 10*3/uL (ref 0–0.1)
Basophils Relative: 1 %
Eosinophils Absolute: 0 10*3/uL (ref 0–0.7)
Eosinophils Relative: 0 %
HCT: 39.4 % (ref 35.0–47.0)
Hemoglobin: 13 g/dL (ref 12.0–16.0)
Lymphocytes Relative: 14 %
Lymphs Abs: 0.8 10*3/uL — ABNORMAL LOW (ref 1.0–3.6)
MCH: 26.7 pg (ref 26.0–34.0)
MCHC: 33 g/dL (ref 32.0–36.0)
MCV: 81 fL (ref 80.0–100.0)
Monocytes Absolute: 0.4 10*3/uL (ref 0.2–0.9)
Monocytes Relative: 7 %
Neutro Abs: 4.3 10*3/uL (ref 1.4–6.5)
Neutrophils Relative %: 78 %
Platelets: 209 10*3/uL (ref 150–440)
RBC: 4.86 MIL/uL (ref 3.80–5.20)
RDW: 14.6 % — ABNORMAL HIGH (ref 11.5–14.5)
WBC: 5.5 10*3/uL (ref 3.6–11.0)

## 2016-11-24 LAB — TSH: TSH: 4.616 u[IU]/mL — ABNORMAL HIGH (ref 0.350–4.500)

## 2016-11-24 LAB — RAPID INFLUENZA A&B ANTIGENS
Influenza A (ARMC): NEGATIVE
Influenza B (ARMC): NEGATIVE

## 2016-11-24 MED ORDER — VANCOMYCIN HCL IN DEXTROSE 750-5 MG/150ML-% IV SOLN
750.0000 mg | Freq: Once | INTRAVENOUS | Status: AC
  Administered 2016-11-24: 750 mg via INTRAVENOUS
  Filled 2016-11-24: qty 150

## 2016-11-24 MED ORDER — ACETAMINOPHEN 500 MG PO TABS
ORAL_TABLET | ORAL | Status: AC
  Filled 2016-11-24: qty 2

## 2016-11-24 MED ORDER — SODIUM CHLORIDE 0.9 % IV BOLUS (SEPSIS)
2000.0000 mL | Freq: Once | INTRAVENOUS | Status: AC
  Administered 2016-11-24: 2000 mL via INTRAVENOUS

## 2016-11-24 MED ORDER — SODIUM CHLORIDE 0.9 % IV BOLUS (SEPSIS)
1000.0000 mL | Freq: Once | INTRAVENOUS | Status: AC
  Administered 2016-11-24: 1000 mL via INTRAVENOUS

## 2016-11-24 MED ORDER — PIPERACILLIN-TAZOBACTAM 3.375 G IVPB 30 MIN
3.3750 g | Freq: Once | INTRAVENOUS | Status: AC
  Administered 2016-11-24: 3.375 g via INTRAVENOUS
  Filled 2016-11-24: qty 50

## 2016-11-24 MED ORDER — ACETAMINOPHEN 500 MG PO TABS
1000.0000 mg | ORAL_TABLET | Freq: Once | ORAL | Status: AC
  Administered 2016-11-24: 1000 mg via ORAL

## 2016-11-24 NOTE — ED Triage Notes (Signed)
Pt to triage via wheelchair.  Pt states bodyaches, chills, fever for 24 hours.  No cough.  Emesis yesterday.

## 2016-11-24 NOTE — ED Notes (Signed)
Pt to Xray.

## 2016-11-24 NOTE — ED Provider Notes (Addendum)
[facility] Emergency Department Provider Note  ____________________________________________   I have reviewed the triage vital signs and the nursing notes.   HISTORY  Chief Complaint Influenza    HPI Miranda Stafford is a 61 y.o. female healthy, patient worse with students, presents today complaining of acute sudden onset fevers, runny nose, vomiting, diarrhea cough and body aches and just generally not feeling well. This is the height of flu season. She did not get her flu shot. She denies any abdominal pain, she denies any significant headache although she has a slight one, she denies any stiff neck, she denies any bloody vomitus.     Past Medical History:  Diagnosis Date  . Allergy   . Asthma   . Depression   . Fibroid tumor   . Herpes zoster   . Hyperlipidemia   . Insomnia   . Osteopenia   . Thyroid disease     Patient Active Problem List   Diagnosis Date Noted  . Family history of breast cancer in sister 05/24/2016  . Hypothyroidism 09/19/2015  . Asthma, moderate persistent, poorly-controlled 09/19/2015  . Perennial allergic rhinitis with seasonal variation 09/19/2015  . Major depression in remission ([place]) 09/19/2015    Past Surgical History:  Procedure Laterality Date  . ABDOMINAL HYSTERECTOMY    . ADENOIDECTOMY    . CESAREAN SECTION    . TUBAL LIGATION      Prior to Admission medications   Medication Sig Start Date End Date Taking? Authorizing Provider  albuterol (PROVENTIL HFA;VENTOLIN HFA) 108 (90 Base) MCG/ACT inhaler Inhale 2 puffs into the lungs every 6 (six) hours as needed for wheezing or shortness of breath. 10/18/16   Steele Sizer, MD  azithromycin (ZITHROMAX) 250 MG tablet Take as directed 2 first day and one daily after that 10/18/16   Steele Sizer, MD  fluticasone furoate-vilanterol (BREO ELLIPTA) 100-25 MCG/INH AEPB Inhale 1 puff into the lungs daily. 10/18/16   Steele Sizer, MD  levothyroxine (SYNTHROID, LEVOTHROID)  25 MCG tablet Take 1 tablet (25 mcg total) by mouth daily. 10/19/16   Steele Sizer, MD  loratadine (CLARITIN) 10 MG tablet Take 1 tablet (10 mg total) by mouth daily. 09/19/15   Steele Sizer, MD  montelukast (SINGULAIR) 10 MG tablet Take 1 tablet (10 mg total) by mouth at bedtime. 05/24/16   Steele Sizer, MD  predniSONE (DELTASONE) 20 MG tablet Take 1 tablet (20 mg total) by mouth daily with breakfast. 10/18/16   Steele Sizer, MD    Allergies Patient has no known allergies.  Family History  Problem Relation Age of Onset  . Heart disease Mother   . Hypertension Mother   . Hyperlipidemia Mother   . Breast cancer Mother 39  . SIDS Son   . Breast cancer Sister 46    Social History Social History  Substance Use Topics  . Smoking status: Former Smoker    Packs/day: 0.50    Years: 5.00    Types: Cigarettes    Quit date: 09/18/1985  . Smokeless tobacco: Never Used  . Alcohol use 0.0 oz/week     Comment: wine    Review of Systems Constitutional: Positive fever/chills Eyes: No visual changes. ENT: No sore throat. No stiff neck no neck pain Cardiovascular: Denies chest pain. Respiratory: Denies shortness of breath. Gastrointestinal: History of present illness Genitourinary: Negative for dysuria. Musculoskeletal: Negative lower extremity swelling Skin: Negative for rash. Neurological: Negative for severe headaches, focal weakness or numbness. 10-point ROS otherwise negative.  ____________________________________________   PHYSICAL  EXAM:  VITAL SIGNS: ED Triage Vitals  Enc Vitals Group     BP 11/24/16 2038 (!) 73/51     Pulse Rate 11/24/16 2038 (!) 139     Resp 11/24/16 2038 (!) 24     Temp 11/24/16 2038 (!) 103.2 F (39.6 C)     Temp Source 11/24/16 2038 Oral     SpO2 11/24/16 2038 96 %     Weight 11/24/16 2038 110 lb (49.9 kg)     Height 11/24/16 2038 5\' 3"  (1.6 m)     Head Circumference --      Peak Flow --      Pain Score 11/24/16 2039 8     Pain Loc --       Pain Edu? --      Excl. in [place]? --     Constitutional: Alert and oriented. Vernard Gambles appears not to feel well, initial pressure however we get about the room is much better than the 70 be got in triage, she has the appearance of someone who has the flu really. Eyes: Conjunctivae are normal. PERRL. EOMI. Head: Atraumatic. Nose: No congestion/rhinnorhea. Mouth/Throat: Mucous membranes are moist.  Oropharynx non-erythematous. Neck: No stridor.   Nontender with no meningismus Cardiovascular: Tachycardia, regular rhythm. Grossly normal heart sounds.  Good peripheral circulation. Respiratory: Normal respiratory effort.  No retractions. Lungs CTAB. Abdominal: Soft and nontender. No distention. No guarding no rebound Back:  There is no focal tenderness or step off.  there is no midline tenderness there are no lesions noted. there is no CVA tenderness Musculoskeletal: No lower extremity tenderness, no upper extremity tenderness. No joint effusions, no DVT signs strong distal pulses no edema Neurologic:  Normal speech and language. No gross focal neurologic deficits are appreciated.  Skin:  Skin is warm, dry and intact. No rash noted. Psychiatric: Mood and affect are normal. Speech and behavior are normal.  ____________________________________________   LABS (all labs ordered are listed, but only abnormal results are displayed)  Labs Reviewed  CBC WITH DIFFERENTIAL/PLATELET - Abnormal; Notable for the following:       Result Value   RDW 14.6 (*)    Lymphs Abs 0.8 (*)    All other components within normal limits  CULTURE, BLOOD (ROUTINE X 2)  CULTURE, BLOOD (ROUTINE X 2)  URINE CULTURE  RAPID INFLUENZA A&B ANTIGENS ([facility] ONLY)  LACTIC ACID, PLASMA  COMPREHENSIVE METABOLIC PANEL  URINALYSIS, ROUTINE W REFLEX MICROSCOPIC  TSH  LACTIC ACID, PLASMA   ____________________________________________  EKG  I personally interpreted any EKGs ordered by me or triage Sinus tachycardia rate 1:15, no  acute ST elevation or depression normal axis unremarkable EKG ____________________________________________  RADIOLOGY  I reviewed any imaging ordered by me or triage that were performed during my shift and, if possible, patient and/or family made aware of any abnormal findings. ____________________________________________   PROCEDURES  Procedure(s) performed: None  Procedures  Critical Care performed: CRITICAL CARE Performed by: Schuyler Amor   Total critical care time: 40 minutes minutes  Critical care time was exclusive of separately billable procedures and treating other patients.  Critical care was necessary to treat or prevent imminent or life-threatening deterioration.  Critical care was time spent personally by me on the following activities: development of treatment plan with patient and/or surrogate as well as nursing, discussions with consultants, evaluation of patient's response to treatment, examination of patient, obtaining history from patient or surrogate, ordering and performing treatments and interventions, ordering and review of laboratory studies, ordering  and review of radiographic studies, pulse oximetry and re-evaluation of patient's condition.   ____________________________________________   INITIAL IMPRESSION / ASSESSMENT AND PLAN / ED COURSE  Pertinent labs & imaging results that were available during my care of the patient were reviewed by me and considered in my medical decision making (see chart for details).  Patient here with acute onset flu symptoms yesterday. Initial pressure was low. Given the very viral appearance of this syndrome including diarrhea or vomiting high fever it appears to be consistent with virus or flu. White count is normal lactic acid is normal, patient certainly be volume depleted for multiple episodes of diarrhea and vomiting. We are aggressively hydrating her. Blood cultures and obtained, chest x-ray obtained, pressures are  responded easily to fluids. We will start empiric antibiotics given her low pressures but I suspect this is likely the flu  ----------------------------------------- 10:48 PM on 11/24/2016 -----------------------------------------  The flu is negative we will check PCR vital signs of corrected rapidly, patient however still feels poorly but is not otherwise toxic. She just looks like someone has the flu. Abdomen is benign on serial exams chest x-ray is reassuring blood work is otherwise reassuring, she isn't acutely dehydrated according to her labs most likely we are giving her IV fluid. Slight elevation of transaminase again consistent with likely viral process area she has no abdominal discomfort or tenderness to palpation in a complaint of it. She is complaining of cough more than anything else. We did give her broad-spectrum antibiotics and we will that to the hospitalist service. Even though it is my opinion most consistent with flu we did treat her with aggressive antibiotics indicates that she may have an unsuspected septicemia  ----------------------------------------- 11:43 PM on 11/24/2016 -----------------------------------------   Admitted to Dr. Jannifer Franklin at 10:50, Dr. Owens Shark to watch while in department.  Clinical Course    ____________________________________________   FINAL CLINICAL IMPRESSION(S) / ED DIAGNOSES  Final diagnoses:  None      This chart was dictated using voice recognition software.  Despite best efforts to proofread,  errors can occur which can change meaning.      Schuyler Amor, MD 11/24/16 2147    Schuyler Amor, MD 11/24/16 [place], MD 11/24/16 [place], MD 11/24/16 [phone]

## 2016-11-25 ENCOUNTER — Telehealth: Payer: Self-pay | Admitting: Family Medicine

## 2016-11-25 DIAGNOSIS — Z7951 Long term (current) use of inhaled steroids: Secondary | ICD-10-CM | POA: Diagnosis not present

## 2016-11-25 DIAGNOSIS — N179 Acute kidney failure, unspecified: Secondary | ICD-10-CM | POA: Diagnosis present

## 2016-11-25 DIAGNOSIS — I959 Hypotension, unspecified: Secondary | ICD-10-CM | POA: Diagnosis present

## 2016-11-25 DIAGNOSIS — Z90710 Acquired absence of both cervix and uterus: Secondary | ICD-10-CM | POA: Diagnosis not present

## 2016-11-25 DIAGNOSIS — E860 Dehydration: Secondary | ICD-10-CM | POA: Diagnosis present

## 2016-11-25 DIAGNOSIS — J101 Influenza due to other identified influenza virus with other respiratory manifestations: Secondary | ICD-10-CM | POA: Diagnosis present

## 2016-11-25 DIAGNOSIS — J4540 Moderate persistent asthma, uncomplicated: Secondary | ICD-10-CM | POA: Diagnosis present

## 2016-11-25 DIAGNOSIS — Z79899 Other long term (current) drug therapy: Secondary | ICD-10-CM | POA: Diagnosis not present

## 2016-11-25 DIAGNOSIS — R112 Nausea with vomiting, unspecified: Secondary | ICD-10-CM | POA: Diagnosis present

## 2016-11-25 DIAGNOSIS — Z87891 Personal history of nicotine dependence: Secondary | ICD-10-CM | POA: Diagnosis not present

## 2016-11-25 DIAGNOSIS — E039 Hypothyroidism, unspecified: Secondary | ICD-10-CM | POA: Diagnosis present

## 2016-11-25 DIAGNOSIS — F325 Major depressive disorder, single episode, in full remission: Secondary | ICD-10-CM | POA: Diagnosis present

## 2016-11-25 DIAGNOSIS — Z8249 Family history of ischemic heart disease and other diseases of the circulatory system: Secondary | ICD-10-CM | POA: Diagnosis not present

## 2016-11-25 LAB — BASIC METABOLIC PANEL
Anion gap: 5 (ref 5–15)
BUN: 13 mg/dL (ref 6–20)
CO2: 21 mmol/L — ABNORMAL LOW (ref 22–32)
Calcium: 7.4 mg/dL — ABNORMAL LOW (ref 8.9–10.3)
Chloride: 113 mmol/L — ABNORMAL HIGH (ref 101–111)
Creatinine, Ser: 1.02 mg/dL — ABNORMAL HIGH (ref 0.44–1.00)
GFR calc Af Amer: >60 mL/min (ref >60)
GFR calc non Af Amer: 59 mL/min — ABNORMAL LOW (ref >60)
Glucose, Bld: 88 mg/dL (ref 65–99)
Potassium: 4.1 mmol/L (ref 3.5–5.1)
Sodium: 139 mmol/L (ref 135–145)

## 2016-11-25 LAB — CBC
HCT: 33 % — ABNORMAL LOW (ref 35.0–47.0)
Hemoglobin: 10.8 g/dL — ABNORMAL LOW (ref 12.0–16.0)
MCH: 27.2 pg (ref 26.0–34.0)
MCHC: 32.8 g/dL (ref 32.0–36.0)
MCV: 83 fL (ref 80.0–100.0)
Platelets: 169 10*3/uL (ref 150–440)
RBC: 3.97 MIL/uL (ref 3.80–5.20)
RDW: 14.6 % — ABNORMAL HIGH (ref 11.5–14.5)
WBC: 3.4 10*3/uL — ABNORMAL LOW (ref 3.6–11.0)

## 2016-11-25 LAB — INFLUENZA PANEL BY PCR (TYPE A & B)
Influenza A By PCR: POSITIVE — AB
Influenza B By PCR: NEGATIVE

## 2016-11-25 LAB — URINALYSIS, COMPLETE (UACMP) WITH MICROSCOPIC
Bacteria, UA: NONE SEEN
Bilirubin Urine: NEGATIVE
Glucose, UA: NEGATIVE mg/dL
Ketones, ur: 5 mg/dL — AB
Leukocytes, UA: NEGATIVE
Nitrite: NEGATIVE
Protein, ur: NEGATIVE mg/dL
Specific Gravity, Urine: 1.009 (ref 1.005–1.030)
pH: 5 (ref 5.0–8.0)

## 2016-11-25 LAB — LACTIC ACID, PLASMA: Lactic Acid, Venous: 1.1 mmol/L (ref 0.5–1.9)

## 2016-11-25 MED ORDER — LEVOTHYROXINE SODIUM 25 MCG PO TABS
25.0000 ug | ORAL_TABLET | Freq: Every day | ORAL | Status: DC
  Administered 2016-11-25: 25 ug via ORAL
  Filled 2016-11-25: qty 1

## 2016-11-25 MED ORDER — ONDANSETRON HCL 4 MG PO TABS: 4.0000 mg | ORAL_TABLET | Freq: Four times a day (QID) | ORAL | Status: DC | PRN

## 2016-11-25 MED ORDER — ONDANSETRON HCL 4 MG/2ML IJ SOLN: 4.0000 mg | Freq: Four times a day (QID) | INTRAMUSCULAR | Status: DC | PRN

## 2016-11-25 MED ORDER — OSELTAMIVIR PHOSPHATE 30 MG PO CAPS
30.0000 mg | ORAL_CAPSULE | Freq: Two times a day (BID) | ORAL | Status: DC
  Administered 2016-11-25 (×2): 30 mg via ORAL
  Filled 2016-11-25 (×3): qty 1

## 2016-11-25 MED ORDER — ACETAMINOPHEN 650 MG RE SUPP: 650.0000 mg | Freq: Four times a day (QID) | RECTAL | Status: DC | PRN

## 2016-11-25 MED ORDER — HEPARIN SODIUM (PORCINE) 5000 UNIT/ML IJ SOLN
5000.0000 [IU] | Freq: Three times a day (TID) | INTRAMUSCULAR | Status: DC
  Filled 2016-11-25: qty 1

## 2016-11-25 MED ORDER — OSELTAMIVIR PHOSPHATE 30 MG PO CAPS: 30.0000 mg | ORAL_CAPSULE | Freq: Two times a day (BID) | ORAL | 0 refills | Status: AC

## 2016-11-25 MED ORDER — ACETAMINOPHEN 325 MG PO TABS
650.0000 mg | ORAL_TABLET | Freq: Four times a day (QID) | ORAL | Status: DC | PRN
  Administered 2016-11-25: 05:00:00 650 mg via ORAL
  Filled 2016-11-25: qty 2

## 2016-11-25 MED ORDER — MONTELUKAST SODIUM 10 MG PO TABS: 10.0000 mg | ORAL_TABLET | Freq: Every day | ORAL | Status: DC

## 2016-11-25 MED ORDER — FLUTICASONE FUROATE-VILANTEROL 100-25 MCG/INH IN AEPB
1.0000 | INHALATION_SPRAY | Freq: Every day | RESPIRATORY_TRACT | Status: DC
  Administered 2016-11-25: 1 via RESPIRATORY_TRACT
  Filled 2016-11-25: qty 28

## 2016-11-25 MED ORDER — SODIUM CHLORIDE 0.9 % IV SOLN
INTRAVENOUS | Status: DC
  Administered 2016-11-25: 05:00:00 via INTRAVENOUS

## 2016-11-25 MED ORDER — IPRATROPIUM-ALBUTEROL 0.5-2.5 (3) MG/3ML IN SOLN: 3.0000 mL | RESPIRATORY_TRACT | Status: DC | PRN

## 2016-11-25 MED ORDER — OSELTAMIVIR PHOSPHATE 75 MG PO CAPS: 75.0000 mg | ORAL_CAPSULE | Freq: Two times a day (BID) | ORAL | Status: DC

## 2016-11-25 MED ORDER — BENZONATATE 100 MG PO CAPS
200.0000 mg | ORAL_CAPSULE | Freq: Three times a day (TID) | ORAL | Status: DC | PRN
  Administered 2016-11-25: 200 mg via ORAL
  Filled 2016-11-25: qty 2

## 2016-11-25 NOTE — Telephone Encounter (Signed)
FYI: The hospital called and scheduled a hospital follow up for 12/02/16. Was there for Influenza A

## 2016-11-25 NOTE — Discharge Summary (Signed)
[facility] [place] NAME: Miranda Stafford    MR#:  [id number]  DATE OF BIRTH:  [birth year]  DATE OF ADMISSION:  11/24/2016 ADMITTING PHYSICIAN: Lance Coon, MD  DATE OF DISCHARGE: 11/25/2016  PRIMARY CARE PHYSICIAN: Loistine Chance, MD    ADMISSION DIAGNOSIS:  Viral syndrome [B34.9]  DISCHARGE DIAGNOSIS:  Principal Problem:   Influenza A Active Problems:   Hypothyroidism   Asthma, moderate persistent, poorly-controlled   Major depression in remission ([place])   AKI (acute kidney injury) ([place])   SECONDARY DIAGNOSIS:   Past Medical History:  Diagnosis Date  . Allergy   . Asthma   . Depression   . Fibroid tumor   . Herpes zoster   . Hyperlipidemia   . Insomnia   . Osteopenia   . Thyroid disease     HOSPITAL COURSE:   61 year old female with a history of moderately persistent asthma who presents with weakness and found to have influenza A.  1. Influenza a: Patient was started on Tamiflu. She will continue treatment for 5 days.  2. Acute kidney injury due to dehydration: This is improving with IV fluids  3. Asthma, moderate persistent: No signs of exacerbation. Patient will continue her inhalers 4. Hypothyroidism patient will continue Synthroid. She needs a repeat TSH in 3 weeks. TSH was 4.6. Minimally elevated.   DISCHARGE CONDITIONS AND DIET:   Stable for discharge on regular diet  CONSULTS OBTAINED:    DRUG ALLERGIES:  No Known Allergies  DISCHARGE MEDICATIONS:   Current Discharge Medication List    START taking these medications   Details  oseltamivir (TAMIFLU) 30 MG capsule Take 1 capsule (30 mg total) by mouth 2 (two) times daily. Qty: 8 capsule, Refills: 0      CONTINUE these medications which have NOT CHANGED   Details  albuterol (PROVENTIL HFA;VENTOLIN HFA) 108 (90 Base) MCG/ACT inhaler Inhale 2 puffs into the lungs every 6 (six) hours as needed for wheezing or shortness of breath. Qty: 1 Inhaler,  Refills: 0   Associated Diagnoses: Asthma, moderate persistent, poorly-controlled    fluticasone furoate-vilanterol (BREO ELLIPTA) 100-25 MCG/INH AEPB Inhale 1 puff into the lungs daily. Qty: 60 each, Refills: 0   Associated Diagnoses: Asthma, moderate persistent, poorly-controlled    levothyroxine (SYNTHROID, LEVOTHROID) 25 MCG tablet Take 1 tablet (25 mcg total) by mouth daily. Qty: 30 tablet, Refills: 2    loratadine (CLARITIN) 10 MG tablet Take 1 tablet (10 mg total) by mouth daily. Qty: 30 tablet, Refills: 11   Associated Diagnoses: Perennial allergic rhinitis with seasonal variation    montelukast (SINGULAIR) 10 MG tablet Take 1 tablet (10 mg total) by mouth at bedtime. Qty: 30 tablet, Refills: 5   Associated Diagnoses: Asthma, moderate persistent, poorly-controlled              Today   CHIEF COMPLAINT:  Patient reports she is feeling better. No shortness of breath or wheezing   VITAL SIGNS:  Blood pressure (!) 100/49, pulse 90, temperature 98.4 F (36.9 C), temperature source Oral, resp. rate 16, height 5\' 3"  (1.6 m), weight 49.9 kg (110 lb), SpO2 100 %.   REVIEW OF SYSTEMS:  Review of Systems  Constitutional: Negative for chills, fever and malaise/fatigue.  HENT: Negative.  Negative for ear discharge, ear pain, hearing loss, nosebleeds and sore throat.   Eyes: Negative.  Negative for blurred vision and pain.  Respiratory: Positive for cough. Negative for hemoptysis, shortness of breath and wheezing.  Cardiovascular: Negative.  Negative for chest pain, palpitations and leg swelling.  Gastrointestinal: Negative.  Negative for abdominal pain, blood in stool, diarrhea, nausea and vomiting.  Genitourinary: Negative.  Negative for dysuria.  Musculoskeletal: Negative.  Negative for back pain.  Skin: Negative.   Neurological: Positive for weakness. Negative for dizziness, tremors, speech change, focal weakness, seizures and headaches.  Endo/Heme/Allergies:  Negative.  Does not bruise/bleed easily.  Psychiatric/Behavioral: Negative.  Negative for depression, hallucinations and suicidal ideas.     PHYSICAL EXAMINATION:  GENERAL:  61 y.o.-year-old patient lying in the bed with no acute distress.  NECK:  Supple, no jugular venous distention. No thyroid enlargement, no tenderness.  LUNGS: Normal breath sounds bilaterally, no wheezing, rales,rhonchi  No use of accessory muscles of respiration.  CARDIOVASCULAR: S1, S2 normal. No murmurs, rubs, or gallops.  ABDOMEN: Soft, non-tender, non-distended. Bowel sounds present. No organomegaly or mass.  EXTREMITIES: No pedal edema, cyanosis, or clubbing.  PSYCHIATRIC: The patient is alert and oriented x 3.  SKIN: No obvious rash, lesion, or ulcer.   DATA REVIEW:   CBC  Recent Labs Lab 11/25/16 0405  WBC 3.4*  HGB 10.8*  HCT 33.0*  PLT 169    Chemistries   Recent Labs Lab 11/24/16 2058 11/25/16 0405  NA 134* 139  K 4.1 4.1  CL 96* 113*  CO2 27 21*  GLUCOSE 111* 88  BUN 16 13  CREATININE 1.45* 1.02*  CALCIUM 9.5 7.4*  AST 183*  --   ALT 98*  --   ALKPHOS 74  --   BILITOT 0.3  --     Cardiac Enzymes No results for input(s): TROPONINI in the last 168 hours.  Microbiology Results  @[email or web address] @  RADIOLOGY:  Dg Chest 2 View  Result Date: 11/24/2016 CLINICAL DATA:  Acute onset of fever, cough and chills. Body aches. Initial encounter. EXAM: CHEST  2 VIEW COMPARISON:  Chest radiograph and CTA of the chest performed 09/06/2010 FINDINGS: The lungs are well-aerated. Mild vascular congestion is noted. There is no evidence of focal opacification, pleural effusion or pneumothorax. The heart is normal in size; the mediastinal contour is within normal limits. No acute osseous abnormalities are seen. IMPRESSION: Mild vascular congestion noted.  Lungs remain grossly clear. Electronically Signed   By: Garald Balding M.D.   On: 11/24/2016 21:13      Management plans discussed with the patient  and she is in agreement. Stable for discharge home  Patient should follow up with pcp  CODE STATUS:     Code Status Orders        Start     Ordered   11/25/16 0400  Full code  Continuous     11/25/16 0359    Code Status History    Date Active Date Inactive Code Status Order ID Comments User Context   This patient has a current code status but no historical code status.      TOTAL TIME TAKING CARE OF THIS PATIENT: 37 minutes.    Note: This dictation was prepared with Dragon dictation along with smaller phrase technology. Any transcriptional errors that result from this process are unintentional.  Mariella Blackwelder M.D on 11/25/2016 at 9:35 AM  Between 7am to 6pm - Pager - [phone] After 6pm go to [email or web address] - password [email or web address] [facility]  Office  [phone]  CC: Primary care physician; Loistine Chance, MD

## 2016-11-25 NOTE — Progress Notes (Signed)
Discharge instructions given and went over with patient and husband at bedside. Prescription reviewed. All questions answered. Patient discharged home with husband via wheelchair by volunteer services. Madlyn Frankel, RN

## 2016-11-25 NOTE — ED Notes (Signed)
Pt's O2 sats noted to drop to mid/high 80's while pt sleeping. Pt sats increased to 94% when aroused and instructed to deep breathe, but dropped again when pt fell back asleep. Seth Bake, primary RN, made aware and pt placed on 2L O2 via [place] with improvement to 99% at this time.

## 2016-11-25 NOTE — ED Notes (Signed)
Pt up to restroom with steady gait. Urine and second flu swap collected. No distress noted at this time. Pt tolerated well. Will continue to assess.

## 2016-11-25 NOTE — ED Notes (Signed)
Admitting MD at the bedside for pt evaluation.  

## 2016-11-25 NOTE — ED Notes (Signed)
Pt resting on stretcher with lights out to enhance rest. Eyes closed and even respirations. Iv fluids infusing. No increased work of breathing noted at this time. Will continue to assess.

## 2016-11-25 NOTE — H&P (Signed)
[facility] [place] NAME: Miranda Stafford    MR#:  [id number]  DATE OF BIRTH:  10/06/1956  DATE OF ADMISSION:  11/24/2016  PRIMARY CARE PHYSICIAN: Loistine Chance, MD   REQUESTING/REFERRING PHYSICIAN: Burlene Arnt, MD  CHIEF COMPLAINT:   Chief Complaint  Patient presents with  . Influenza    HISTORY OF PRESENT ILLNESS:  Miranda Stafford  is a 61 y.o. female who presents with One-day acute onset nausea vomiting, diarrhea, fever, cough. Here in the ED the patient was found to be influenza A+. She was also noted to have AKI, and to be mildly hypotensive. Hospitalists were called for admission  PAST MEDICAL HISTORY:   Past Medical History:  Diagnosis Date  . Allergy   . Asthma   . Depression   . Fibroid tumor   . Herpes zoster   . Hyperlipidemia   . Insomnia   . Osteopenia   . Thyroid disease     PAST SURGICAL HISTORY:   Past Surgical History:  Procedure Laterality Date  . ABDOMINAL HYSTERECTOMY    . ADENOIDECTOMY    . CESAREAN SECTION    . TUBAL LIGATION      SOCIAL HISTORY:   Social History  Substance Use Topics  . Smoking status: Former Smoker    Packs/day: 0.50    Years: 5.00    Types: Cigarettes    Quit date: 09/18/1985  . Smokeless tobacco: Never Used  . Alcohol use 0.0 oz/week     Comment: wine    FAMILY HISTORY:   Family History  Problem Relation Age of Onset  . Heart disease Mother   . Hypertension Mother   . Hyperlipidemia Mother   . Breast cancer Mother 27  . SIDS Son   . Breast cancer Sister 40    DRUG ALLERGIES:  No Known Allergies  MEDICATIONS AT HOME:   Prior to Admission medications   Medication Sig Start Date End Date Taking? Authorizing Provider  albuterol (PROVENTIL HFA;VENTOLIN HFA) 108 (90 Base) MCG/ACT inhaler Inhale 2 puffs into the lungs every 6 (six) hours as needed for wheezing or shortness of breath. 10/18/16  Yes Steele Sizer, MD  fluticasone furoate-vilanterol (BREO  ELLIPTA) 100-25 MCG/INH AEPB Inhale 1 puff into the lungs daily. 10/18/16  Yes Steele Sizer, MD  levothyroxine (SYNTHROID, LEVOTHROID) 25 MCG tablet Take 1 tablet (25 mcg total) by mouth daily. 10/19/16  Yes Steele Sizer, MD  loratadine (CLARITIN) 10 MG tablet Take 1 tablet (10 mg total) by mouth daily. Patient not taking: Reported on 11/24/2016 09/19/15   Steele Sizer, MD  montelukast (SINGULAIR) 10 MG tablet Take 1 tablet (10 mg total) by mouth at bedtime. Patient not taking: Reported on 11/24/2016 05/24/16   Steele Sizer, MD    REVIEW OF SYSTEMS:  Review of Systems  Constitutional: Negative for chills, fever, malaise/fatigue and weight loss.  HENT: Negative for ear pain, hearing loss and tinnitus.   Eyes: Negative for blurred vision, double vision, pain and redness.  Respiratory: Positive for cough. Negative for hemoptysis and shortness of breath.   Cardiovascular: Negative for chest pain, palpitations, orthopnea and leg swelling.  Gastrointestinal: Positive for diarrhea, nausea and vomiting. Negative for abdominal pain and constipation.  Genitourinary: Negative for dysuria, frequency and hematuria.  Musculoskeletal: Positive for myalgias. Negative for back pain, joint pain and neck pain.  Skin:       No acne, rash, or lesions  Neurological: Negative for dizziness, tremors, focal weakness and weakness.  Endo/Heme/Allergies: Negative for polydipsia. Does not bruise/bleed easily.  Psychiatric/Behavioral: Negative for depression. The patient is not nervous/anxious and does not have insomnia.      VITAL SIGNS:   Vitals:   11/24/16 2133 11/24/16 2219 11/24/16 2245 11/25/16 0000  BP: 91/60 93/62 90/61  (!) 85/57  Pulse: (!) 110 (!) 107 (!) 102 91  Resp: 20 18 17 18   Temp: (!) 101.4 F (38.6 C)     TempSrc: Oral     SpO2: 94% 97% 99% 95%  Weight:      Height:       Wt Readings from Last 3 Encounters:  11/24/16 49.9 kg (110 lb)  10/18/16 55 kg (121 lb 4 oz)  06/10/16 53.5 kg  (118 lb)    PHYSICAL EXAMINATION:  Physical Exam  Vitals reviewed. Constitutional: She is oriented to person, place, and time. She appears well-developed and well-nourished. No distress.  HENT:  Head: Normocephalic and atraumatic.  Dry mucous membranes  Eyes: Conjunctivae and EOM are normal. Pupils are equal, round, and reactive to light. No scleral icterus.  Neck: Normal range of motion. Neck supple. No JVD present. No thyromegaly present.  Cardiovascular: Normal rate, regular rhythm and intact distal pulses.  Exam reveals no gallop and no friction rub.   No murmur heard. Respiratory: Effort normal and breath sounds normal. No respiratory distress. She has no wheezes. She has no rales.  GI: Soft. Bowel sounds are normal. She exhibits no distension. There is no tenderness.  Musculoskeletal: Normal range of motion. She exhibits no edema.  No arthritis, no gout  Lymphadenopathy:    She has no cervical adenopathy.  Neurological: She is alert and oriented to person, place, and time. No cranial nerve deficit.  No dysarthria, no aphasia  Skin: Skin is warm and dry. No rash noted. No erythema.  Psychiatric: She has a normal mood and affect. Her behavior is normal. Judgment and thought content normal.    LABORATORY PANEL:   CBC  Recent Labs Lab 11/24/16 2058  WBC 5.5  HGB 13.0  HCT 39.4  PLT 209   ------------------------------------------------------------------------------------------------------------------  Chemistries   Recent Labs Lab 11/24/16 2058  NA 134*  K 4.1  CL 96*  CO2 27  GLUCOSE 111*  BUN 16  CREATININE 1.45*  CALCIUM 9.5  AST 183*  ALT 98*  ALKPHOS 74  BILITOT 0.3   ------------------------------------------------------------------------------------------------------------------  Cardiac Enzymes No results for input(s): TROPONINI in the last 168  hours. ------------------------------------------------------------------------------------------------------------------  RADIOLOGY:  Dg Chest 2 View  Result Date: 11/24/2016 CLINICAL DATA:  Acute onset of fever, cough and chills. Body aches. Initial encounter. EXAM: CHEST  2 VIEW COMPARISON:  Chest radiograph and CTA of the chest performed 09/06/2010 FINDINGS: The lungs are well-aerated. Mild vascular congestion is noted. There is no evidence of focal opacification, pleural effusion or pneumothorax. The heart is normal in size; the mediastinal contour is within normal limits. No acute osseous abnormalities are seen. IMPRESSION: Mild vascular congestion noted.  Lungs remain grossly clear. Electronically Signed   By: Garald Balding M.D.   On: 11/24/2016 21:13    EKG:   Orders placed or performed during the hospital encounter of 11/24/16  . ED EKG 12-Lead  . ED EKG 12-Lead  . EKG 12-Lead  . EKG 12-Lead  . EKG 12-Lead  . EKG 12-Lead    IMPRESSION AND PLAN:  Principal Problem:   Influenza A - Tamiflu started in the ED, symptomatic treatment with when necessary nebs, antitussives, antiemetics Active Problems:  AKI (acute kidney injury) ([place]) - likely due to dehydration from nausea and vomiting causing prerenal insult, IV hydration, avoid nephrotoxins, monitor for expected improvement   Asthma, moderate persistent, poorly-controlled - continue home inhalers, when necessary duo nebs as above   Hypothyroidism - home dose thyroid replacement  All the records are reviewed and case discussed with ED provider. Management plans discussed with the patient and/or family.  DVT PROPHYLAXIS: SubQ lovenox  GI PROPHYLAXIS: None  ADMISSION STATUS: Inpatient  CODE STATUS: Full Code Status History    This patient does not have a recorded code status. Please follow your organizational policy for patients in this situation.      TOTAL TIME TAKING CARE OF THIS PATIENT: 45 minutes.    Sarh Kirschenbaum,  Nola Botkins [place] 11/25/2016, 1:32 AM  [facility]  Office  [phone]  CC: Primary care physician; Loistine Chance, MD

## 2016-11-26 LAB — URINE CULTURE: Culture: NO GROWTH

## 2016-11-29 LAB — CULTURE, BLOOD (ROUTINE X 2)
Culture: NO GROWTH
Culture: NO GROWTH

## 2016-12-02 ENCOUNTER — Ambulatory Visit: Payer: BC Managed Care – PPO | Admitting: Family Medicine

## 2016-12-03 ENCOUNTER — Ambulatory Visit (INDEPENDENT_AMBULATORY_CARE_PROVIDER_SITE_OTHER): Payer: BC Managed Care – PPO | Admitting: Family Medicine

## 2016-12-03 ENCOUNTER — Encounter: Payer: Self-pay | Admitting: Family Medicine

## 2016-12-03 VITALS — BP 114/74 | HR 92 | Temp 98.2°F | Resp 16 | Ht 63.0 in | Wt 115.2 lb

## 2016-12-03 DIAGNOSIS — J111 Influenza due to unidentified influenza virus with other respiratory manifestations: Secondary | ICD-10-CM

## 2016-12-03 DIAGNOSIS — D649 Anemia, unspecified: Secondary | ICD-10-CM

## 2016-12-03 DIAGNOSIS — N179 Acute kidney failure, unspecified: Secondary | ICD-10-CM

## 2016-12-03 DIAGNOSIS — E785 Hyperlipidemia, unspecified: Secondary | ICD-10-CM

## 2016-12-03 DIAGNOSIS — J4540 Moderate persistent asthma, uncomplicated: Secondary | ICD-10-CM | POA: Diagnosis not present

## 2016-12-03 DIAGNOSIS — E038 Other specified hypothyroidism: Secondary | ICD-10-CM

## 2016-12-03 DIAGNOSIS — Z09 Encounter for follow-up examination after completed treatment for conditions other than malignant neoplasm: Secondary | ICD-10-CM

## 2016-12-03 DIAGNOSIS — R059 Cough, unspecified: Secondary | ICD-10-CM

## 2016-12-03 DIAGNOSIS — R05 Cough: Secondary | ICD-10-CM | POA: Diagnosis not present

## 2016-12-03 MED ORDER — AZITHROMYCIN 250 MG PO TABS: ORAL_TABLET | ORAL | 0 refills | Status: DC

## 2016-12-03 NOTE — Patient Instructions (Signed)
[facility]   

## 2016-12-03 NOTE — Progress Notes (Signed)
Name: Miranda Stafford   MRN: [id number]    DOB: 05/05/1956   Date:12/03/2016       Progress Note  Subjective  Chief Complaint  Chief Complaint  Patient presents with  . Hospitalization Follow-up    pt was admitted for the flu    HPI  Hospital follow up: admitted on 11/24/2016 for evaluation of fever and weakness. Admitted with Type A flu, dehydrated and Acute kidney insufficiency. She was given fluids, tamiflu and Breo for asthma. She was discharged on the 23 rd. She still feels tired, she finished the Tamiflu, she only used Breo for the first two days. She has poor appetite, staying hydrated, still has a wet cough, no energy, feeling very tired still. Labs from [facility], showed TSH is better but still not at goal, WBC is low and she had anemia ( it could be secondary to hemodilution ), Kidney function improved prior to discharge.    Patient Active Problem List   Diagnosis Date Noted  . Family history of breast cancer in sister 05/24/2016  . Hypothyroidism 09/19/2015  . Asthma, moderate persistent, poorly-controlled 09/19/2015  . Perennial allergic rhinitis with seasonal variation 09/19/2015  . Major depression in remission ([place]) 09/19/2015    Past Surgical History:  Procedure Laterality Date  . ABDOMINAL HYSTERECTOMY    . ADENOIDECTOMY    . CESAREAN SECTION    . TUBAL LIGATION      Family History  Problem Relation Age of Onset  . Heart disease Mother   . Hypertension Mother   . Hyperlipidemia Mother   . Breast cancer Mother 72  . SIDS Son   . Breast cancer Sister 60    Social History   Social History  . Marital status: Married    Spouse name: N/A  . Number of children: N/A  . Years of education: N/A   Occupational History  . Not on file.   Social History Main Topics  . Smoking status: Former Smoker    Packs/day: 0.50    Years: 5.00    Types: Cigarettes    Quit date: 09/18/1985  . Smokeless tobacco: Never Used  . Alcohol use 0.0 oz/week     Comment: wine  .  Drug use: No  . Sexual activity: Yes   Other Topics Concern  . Not on file   Social History Narrative  . No narrative on file     Current Outpatient Prescriptions:  .  albuterol (PROVENTIL HFA;VENTOLIN HFA) 108 (90 Base) MCG/ACT inhaler, Inhale 2 puffs into the lungs every 6 (six) hours as needed for wheezing or shortness of breath., Disp: 1 Inhaler, Rfl: 0 .  fluticasone furoate-vilanterol (BREO ELLIPTA) 100-25 MCG/INH AEPB, Inhale 1 puff into the lungs daily., Disp: 60 each, Rfl: 0 .  levothyroxine (SYNTHROID, LEVOTHROID) 25 MCG tablet, Take 1 tablet (25 mcg total) by mouth daily., Disp: 30 tablet, Rfl: 2 .  loratadine (CLARITIN) 10 MG tablet, Take 1 tablet (10 mg total) by mouth daily. (Patient not taking: Reported on 11/24/2016), Disp: 30 tablet, Rfl: 11 .  montelukast (SINGULAIR) 10 MG tablet, Take 1 tablet (10 mg total) by mouth at bedtime. (Patient not taking: Reported on 11/24/2016), Disp: 30 tablet, Rfl: 5  No Known Allergies   ROS  Constitutional: Negative for fever or significant  weight change.  Respiratory: Positive  for cough but no shortness of breath.   Cardiovascular: Negative for chest pain or palpitations.  Gastrointestinal: Negative for abdominal pain, no bowel changes.  Musculoskeletal: Negative for gait  problem or joint swelling.  Skin: Negative for rash.  Neurological: Positive  for intermittent dizziness , no  headache.  No other specific complaints in a complete review of systems (except as listed in HPI above).  Objective  Vitals:   12/03/16 1406  BP: 114/74  Pulse: 92  Resp: 16  Temp: 98.2 F (36.8 C)  SpO2: 91%  Weight: 115 lb 4 oz (52.3 kg)  Height: '5\' 3"'$  (1.6 m)    Body mass index is 20.42 kg/m.  Physical Exam  Constitutional: Patient appears well-developed and well-nourished.  No distress.  HEENT: head atraumatic, normocephalic, pupils equal and reactive to light, ears normal TM bilaterally , neck supple, throat within normal  limits Cardiovascular: Normal rate, regular rhythm and normal heart sounds.  No murmur heard. No BLE edema. Pulmonary/Chest: Effort normal, rhonchi on right lung field, no wheezing or crackles. No respiratory distress.  Abdominal: Soft.  There is no tenderness. Psychiatric: Patient has a normal mood and affect. behavior is normal. Judgment and thought content normal.  Recent Results (from the past 2160 hour(s))  COMPLETE METABOLIC PANEL WITH GFR     Status: None   Collection Time: 10/18/16  4:29 PM  Result Value Ref Range   Sodium 141 135 - 146 mmol/L   Potassium 4.2 3.5 - 5.3 mmol/L   Chloride 104 98 - 110 mmol/L   CO2 29 20 - 31 mmol/L   Glucose, Bld 86 65 - 99 mg/dL   BUN 9 7 - 25 mg/dL   Creat 0.95 0.50 - 1.05 mg/dL    Comment:   For patients > or = 59 years of age: The upper reference limit for Creatinine is approximately 13% higher for people identified as African-American.      Total Bilirubin 0.3 0.2 - 1.2 mg/dL   Alkaline Phosphatase 60 33 - 130 U/L   AST 28 10 - 35 U/L   ALT 20 6 - 29 U/L   Total Protein 7.8 6.1 - 8.1 g/dL   Albumin 4.6 3.6 - 5.1 g/dL   Calcium 9.7 8.6 - 10.4 mg/dL   GFR, Est African American 76 >=60 mL/min   GFR, Est Non African American 66 >=60 mL/min  TSH     Status: Abnormal   Collection Time: 10/18/16  4:29 PM  Result Value Ref Range   TSH 6.84 (H) mIU/L    Comment:   Reference Range   > or = 20 Years  0.40-4.50   Pregnancy Range First trimester  0.26-2.66 Second trimester 0.55-2.73 Third trimester  0.43-2.91     Comprehensive metabolic panel     Status: Abnormal   Collection Time: 11/24/16  8:58 PM  Result Value Ref Range   Sodium 134 (L) 135 - 145 mmol/L   Potassium 4.1 3.5 - 5.1 mmol/L   Chloride 96 (L) 101 - 111 mmol/L   CO2 27 22 - 32 mmol/L   Glucose, Bld 111 (H) 65 - 99 mg/dL   BUN 16 6 - 20 mg/dL   Creatinine, Ser 1.45 (H) 0.44 - 1.00 mg/dL   Calcium 9.5 8.9 - 10.3 mg/dL   Total Protein 9.1 (H) 6.5 - 8.1 g/dL   Albumin  4.6 3.5 - 5.0 g/dL   AST 183 (H) 15 - 41 U/L   ALT 98 (H) 14 - 54 U/L   Alkaline Phosphatase 74 38 - 126 U/L   Total Bilirubin 0.3 0.3 - 1.2 mg/dL   GFR calc non Af Amer 39 (L) >60 mL/min  GFR calc Af Amer 45 (L) >60 mL/min    Comment: (NOTE) The eGFR has been calculated using the CKD EPI equation. This calculation has not been validated in all clinical situations. eGFR's persistently <60 mL/min signify possible Chronic Kidney Disease.    Anion gap 11 5 - 15  CBC WITH DIFFERENTIAL     Status: Abnormal   Collection Time: 11/24/16  8:58 PM  Result Value Ref Range   WBC 5.5 3.6 - 11.0 K/uL   RBC 4.86 3.80 - 5.20 MIL/uL   Hemoglobin 13.0 12.0 - 16.0 g/dL   HCT 39.4 35.0 - 47.0 %   MCV 81.0 80.0 - 100.0 fL   MCH 26.7 26.0 - 34.0 pg   MCHC 33.0 32.0 - 36.0 g/dL   RDW 14.6 (H) 11.5 - 14.5 %   Platelets 209 150 - 440 K/uL   Neutrophils Relative % 78 %   Neutro Abs 4.3 1.4 - 6.5 K/uL   Lymphocytes Relative 14 %   Lymphs Abs 0.8 (L) 1.0 - 3.6 K/uL   Monocytes Relative 7 %   Monocytes Absolute 0.4 0.2 - 0.9 K/uL   Eosinophils Relative 0 %   Eosinophils Absolute 0.0 0 - 0.7 K/uL   Basophils Relative 1 %   Basophils Absolute 0.0 0 - 0.1 K/uL  Blood Culture (routine x 2)     Status: None   Collection Time: 11/24/16  8:58 PM  Result Value Ref Range   Specimen Description BLOOD LEFT ANTECUBITAL    Special Requests      BOTTLES DRAWN AEROBIC AND ANAEROBIC  AER 12CC ANA 8CC   Culture NO GROWTH 5 DAYS    Report Status 11/29/2016 FINAL   TSH     Status: Abnormal   Collection Time: 11/24/16  8:58 PM  Result Value Ref Range   TSH 4.616 (H) 0.350 - 4.500 uIU/mL    Comment: Performed by a 3rd Generation assay with a functional sensitivity of <=0.01 uIU/mL.  Lactic acid, plasma     Status: None   Collection Time: 11/24/16  9:03 PM  Result Value Ref Range   Lactic Acid, Venous 1.7 0.5 - 1.9 mmol/L  Rapid Influenza A&B Antigens ([facility] only)     Status: None   Collection Time: 11/24/16   9:22 PM  Result Value Ref Range   Influenza A ([facility]) NEGATIVE NEGATIVE   Influenza B ([facility]) NEGATIVE NEGATIVE  Blood Culture (routine x 2)     Status: None   Collection Time: 11/24/16  9:49 PM  Result Value Ref Range   Specimen Description BLOOD  R AC     Special Requests      BOTTLES DRAWN AEROBIC AND ANAEROBIC  AER 12 ML ANA 12 ML   Culture NO GROWTH 5 DAYS    Report Status 11/29/2016 FINAL   Urine culture     Status: None   Collection Time: 11/24/16 11:03 PM  Result Value Ref Range   Specimen Description URINE, RANDOM    Special Requests NONE    Culture NO GROWTH Performed at [facility]     Report Status 11/26/2016 FINAL   Urinalysis, Complete w Microscopic     Status: Abnormal   Collection Time: 11/24/16 11:03 PM  Result Value Ref Range   Color, Urine STRAW (A) YELLOW   APPearance CLEAR (A) CLEAR   Specific Gravity, Urine 1.009 1.005 - 1.030   pH 5.0 5.0 - 8.0   Glucose, UA NEGATIVE NEGATIVE mg/dL   Hgb urine dipstick  MODERATE (A) NEGATIVE   Bilirubin Urine NEGATIVE NEGATIVE   Ketones, ur 5 (A) NEGATIVE mg/dL   Protein, ur NEGATIVE NEGATIVE mg/dL   Nitrite NEGATIVE NEGATIVE   Leukocytes, UA NEGATIVE NEGATIVE   RBC / HPF 0-5 0 - 5 RBC/hpf   WBC, UA 0-5 0 - 5 WBC/hpf   Bacteria, UA NONE SEEN NONE SEEN   Squamous Epithelial / LPF 0-5 (A) NONE SEEN   Mucous PRESENT   Influenza panel by PCR (type A & B, H1N1)     Status: Abnormal   Collection Time: 11/24/16 11:03 PM  Result Value Ref Range   Influenza A By PCR POSITIVE (A) NEGATIVE   Influenza B By PCR NEGATIVE NEGATIVE    Comment: (NOTE) The Xpert Xpress Flu assay is intended as an aid in the diagnosis of  influenza and should not be used as a sole basis for treatment.  This  assay is FDA approved for nasopharyngeal swab specimens only. Nasal  washings and aspirates are unacceptable for Xpert Xpress Flu testing.   Lactic acid, plasma     Status: None   Collection Time: 11/25/16 12:03 AM  Result Value Ref  Range   Lactic Acid, Venous 1.1 0.5 - 1.9 mmol/L  CBC     Status: Abnormal   Collection Time: 11/25/16  4:05 AM  Result Value Ref Range   WBC 3.4 (L) 3.6 - 11.0 K/uL   RBC 3.97 3.80 - 5.20 MIL/uL   Hemoglobin 10.8 (L) 12.0 - 16.0 g/dL   HCT 82.4 (L) 78.1 - 45.9 %   MCV 83.0 80.0 - 100.0 fL   MCH 27.2 26.0 - 34.0 pg   MCHC 32.8 32.0 - 36.0 g/dL   RDW 71.5 (H) 52.3 - 87.5 %   Platelets 169 150 - 440 K/uL  Basic metabolic panel     Status: Abnormal   Collection Time: 11/25/16  4:05 AM  Result Value Ref Range   Sodium 139 135 - 145 mmol/L   Potassium 4.1 3.5 - 5.1 mmol/L   Chloride 113 (H) 101 - 111 mmol/L   CO2 21 (L) 22 - 32 mmol/L   Glucose, Bld 88 65 - 99 mg/dL   BUN 13 6 - 20 mg/dL   Creatinine, Ser 8.32 (H) 0.44 - 1.00 mg/dL   Calcium 7.4 (L) 8.9 - 10.3 mg/dL   GFR calc non Af Amer 59 (L) >60 mL/min   GFR calc Af Amer >60 >60 mL/min    Comment: (NOTE) The eGFR has been calculated using the CKD EPI equation. This calculation has not been validated in all clinical situations. eGFR's persistently <60 mL/min signify possible Chronic Kidney Disease.    Anion gap 5 5 - 15      PHQ2/9: Depression screen [facility] 2/9 12/03/2016 10/18/2016 05/24/2016 09/19/2015 09/19/2015  Decreased Interest 0 0 0 0 0  Down, Depressed, Hopeless 0 0 0 0 0  PHQ - 2 Score 0 0 0 0 0     Fall Risk: Fall Risk  12/03/2016 10/18/2016 05/24/2016 09/19/2015  Falls in the past year? No No No No    Functional Status Survey: Is the patient deaf or have difficulty hearing?: No Does the patient have difficulty seeing, even when wearing glasses/contacts?: No Does the patient have difficulty concentrating, remembering, or making decisions?: No Does the patient have difficulty walking or climbing stairs?: No Does the patient have difficulty dressing or bathing?: No Does the patient have difficulty doing errands alone such as visiting a doctor's office  or shopping?: No    Assessment & Plan  1. Hospital  discharge follow-up  - COMPLETE METABOLIC PANEL WITH GFR  2. Asthma, moderate persistent, poorly-controlled  She stopped using Breo prn, advised to use daily for the next two weeks - at least.  3. Flu  Still tired, finished Tamiflu  4. AKI (acute kidney injury) ([place])  - COMPLETE METABOLIC PANEL WITH GFR  5. Anemia, unspecified type  - CBC with Differential/Platelet  6. Other specified hypothyroidism  - TSH  7. Dyslipidemia  - Lipid panel  8. Cough  Possible concomitant pneumonia, she had the flu, has asthma, still very tired and has a wet cough with rhonchi on right lung field. Advised Z-pack resume Breo and return in one week if no resolution of symptoms  - azithromycin (ZITHROMAX) 250 MG tablet; Take as directed  Dispense: 6 tablet; Refill: 0

## 2016-12-30 ENCOUNTER — Other Ambulatory Visit: Payer: Self-pay | Admitting: Family Medicine

## 2016-12-30 LAB — LIPID PANEL
Cholesterol: 290 mg/dL — ABNORMAL HIGH (ref <200)
HDL: 94 mg/dL (ref >50)
LDL Cholesterol: 181 mg/dL — ABNORMAL HIGH (ref <100)
Total CHOL/HDL Ratio: 3.1 Ratio (ref <5.0)
Triglycerides: 74 mg/dL (ref <150)
VLDL: 15 mg/dL (ref <30)

## 2016-12-30 LAB — COMPLETE METABOLIC PANEL WITH GFR
ALT: 11 U/L (ref 6–29)
AST: 25 U/L (ref 10–35)
Albumin: 4.5 g/dL (ref 3.6–5.1)
Alkaline Phosphatase: 34 U/L (ref 33–130)
BUN: 8 mg/dL (ref 7–25)
CO2: 25 mmol/L (ref 20–31)
Calcium: 9.3 mg/dL (ref 8.6–10.4)
Chloride: 104 mmol/L (ref 98–110)
Creat: 1.05 mg/dL (ref 0.50–1.05)
GFR, Est African American: 68 mL/min (ref >=60)
GFR, Est Non African American: 59 mL/min — ABNORMAL LOW (ref >=60)
Glucose, Bld: 85 mg/dL (ref 65–99)
Potassium: 4 mmol/L (ref 3.5–5.3)
Sodium: 139 mmol/L (ref 135–146)
Total Bilirubin: 0.6 mg/dL (ref 0.2–1.2)
Total Protein: 7.4 g/dL (ref 6.1–8.1)

## 2016-12-30 LAB — CBC WITH DIFFERENTIAL/PLATELET
Basophils Absolute: 68 cells/uL (ref 0–200)
Basophils Relative: 2 %
Eosinophils Absolute: 340 cells/uL (ref 15–500)
Eosinophils Relative: 10 %
HCT: 33 % — ABNORMAL LOW (ref 35.0–45.0)
Hemoglobin: 10.4 g/dL — ABNORMAL LOW (ref 11.7–15.5)
Lymphocytes Relative: 65 %
Lymphs Abs: 2210 cells/uL (ref 850–3900)
MCH: 26.5 pg — ABNORMAL LOW (ref 27.0–33.0)
MCHC: 31.5 g/dL — ABNORMAL LOW (ref 32.0–36.0)
MCV: 84.2 fL (ref 80.0–100.0)
MPV: 10.6 fL (ref 7.5–12.5)
Monocytes Absolute: 170 cells/uL — ABNORMAL LOW (ref 200–950)
Monocytes Relative: 5 %
Neutro Abs: 612 cells/uL — ABNORMAL LOW (ref 1500–7800)
Neutrophils Relative %: 18 %
Platelets: 206 10*3/uL (ref 140–400)
RBC: 3.92 MIL/uL (ref 3.80–5.10)
RDW: 16.1 % — ABNORMAL HIGH (ref 11.0–15.0)
WBC: 3.4 10*3/uL — ABNORMAL LOW (ref 3.8–10.8)

## 2016-12-30 LAB — TSH: TSH: 9.37 mIU/L — ABNORMAL HIGH

## 2017-01-17 ENCOUNTER — Other Ambulatory Visit: Payer: Self-pay | Admitting: Family Medicine

## 2017-01-17 ENCOUNTER — Telehealth: Payer: Self-pay

## 2017-01-17 MED ORDER — ATORVASTATIN CALCIUM 40 MG PO TABS: 40.0000 mg | ORAL_TABLET | Freq: Every day | ORAL | 0 refills | Status: DC

## 2017-01-17 MED ORDER — LEVOTHYROXINE SODIUM 50 MCG PO TABS: 25.0000 ug | ORAL_TABLET | Freq: Every day | ORAL | 1 refills | Status: DC

## 2017-01-17 NOTE — Telephone Encounter (Signed)
Patient notified of lab results by phone and states she is taking Levothyroxine 25 mcq daily. But she feels exhausted and wants to know would Dr. Ancil Boozer be changing her medication? Also patient is willing to start cholesterol medication due to recent blood work.

## 2017-01-20 ENCOUNTER — Ambulatory Visit (INDEPENDENT_AMBULATORY_CARE_PROVIDER_SITE_OTHER): Payer: BC Managed Care – PPO | Admitting: Family Medicine

## 2017-01-20 ENCOUNTER — Encounter: Payer: Self-pay | Admitting: Family Medicine

## 2017-01-20 VITALS — BP 108/68 | HR 80 | Temp 97.7°F | Resp 16 | Ht 63.0 in | Wt 117.7 lb

## 2017-01-20 DIAGNOSIS — H10022 Other mucopurulent conjunctivitis, left eye: Secondary | ICD-10-CM

## 2017-01-20 DIAGNOSIS — R42 Dizziness and giddiness: Secondary | ICD-10-CM | POA: Diagnosis not present

## 2017-01-20 DIAGNOSIS — E039 Hypothyroidism, unspecified: Secondary | ICD-10-CM

## 2017-01-20 DIAGNOSIS — D649 Anemia, unspecified: Secondary | ICD-10-CM | POA: Diagnosis not present

## 2017-01-20 DIAGNOSIS — E785 Hyperlipidemia, unspecified: Secondary | ICD-10-CM

## 2017-01-20 LAB — CBC WITH DIFFERENTIAL/PLATELET
Basophils Absolute: 34 cells/uL (ref 0–200)
Basophils Relative: 1 %
Eosinophils Absolute: 374 cells/uL (ref 15–500)
Eosinophils Relative: 11 %
HCT: 32.6 % — ABNORMAL LOW (ref 35.0–45.0)
Hemoglobin: 10.4 g/dL — ABNORMAL LOW (ref 11.7–15.5)
Lymphocytes Relative: 61 %
Lymphs Abs: 2074 cells/uL (ref 850–3900)
MCH: 26.9 pg — ABNORMAL LOW (ref 27.0–33.0)
MCHC: 31.9 g/dL — ABNORMAL LOW (ref 32.0–36.0)
MCV: 84.5 fL (ref 80.0–100.0)
MPV: 10.8 fL (ref 7.5–12.5)
Monocytes Absolute: 170 cells/uL — ABNORMAL LOW (ref 200–950)
Monocytes Relative: 5 %
Neutro Abs: 748 cells/uL — ABNORMAL LOW (ref 1500–7800)
Neutrophils Relative %: 22 %
Platelets: 205 10*3/uL (ref 140–400)
RBC: 3.86 MIL/uL (ref 3.80–5.10)
RDW: 15.3 % — ABNORMAL HIGH (ref 11.0–15.0)
WBC: 3.4 10*3/uL — ABNORMAL LOW (ref 3.8–10.8)

## 2017-01-20 LAB — TSH: TSH: 6.02 mIU/L — ABNORMAL HIGH

## 2017-01-20 LAB — IRON,TIBC AND FERRITIN PANEL
%SAT: 23 % (ref 11–50)
Ferritin: 161 ng/mL (ref 10–232)
Iron: 58 ug/dL (ref 45–160)
TIBC: 247 ug/dL — ABNORMAL LOW (ref 250–450)

## 2017-01-20 MED ORDER — CIPROFLOXACIN HCL 0.3 % OP SOLN: 2.0000 [drp] | OPHTHALMIC | 0 refills | Status: DC

## 2017-01-20 NOTE — Patient Instructions (Signed)
Iron-Rich Diet Iron is a mineral that helps your body to produce hemoglobin. Hemoglobin is a protein in your red blood cells that carries oxygen to your body's tissues. Eating too little iron may cause you to feel weak and tired, and it can increase your risk for infection. Eating enough iron is necessary for your body's metabolism, muscle function, and nervous system. Iron is naturally found in many foods. It can also be added to foods or fortified in foods. There are two types of dietary iron:  Heme iron. Heme iron is absorbed by the body more easily than nonheme iron. Heme iron is found in meat, poultry, and fish.  Nonheme iron. Nonheme iron is found in dietary supplements, iron-fortified grains, beans, and vegetables. You may need to follow an iron-rich diet if:  You have been diagnosed with iron deficiency or iron-deficiency anemia.  You have a condition that prevents you from absorbing dietary iron, such as:  Infection in your intestines.  Celiac disease. This involves long-lasting (chronic) inflammation of your intestines.  You do not eat enough iron.  You eat a diet that is high in foods that impair iron absorption.  You have lost a lot of blood.  You have heavy bleeding during your menstrual cycle.  You are pregnant. What is my plan? Your health care provider may help you to determine how much iron you need per day based on your condition. Generally, when a person consumes sufficient amounts of iron in the diet, the following iron needs are met:  Men.  24-56 years old: 11 mg per day.  59-71 years old: 8 mg per day.  Women.  46-49 years old: 15 mg per day.  61-31 years old: 18 mg per day.  Over 8 years old: 8 mg per day.  Pregnant women: 27 mg per day.  Breastfeeding women: 9 mg per day. What do I need to know about an iron-rich diet?  Eat fresh fruits and vegetables that are high in vitamin C along with foods that are high in iron. This will help increase the  amount of iron that your body absorbs from food, especially with foods containing nonheme iron. Foods that are high in vitamin C include oranges, peppers, tomatoes, and mango.  Take iron supplements only as directed by your health care provider. Overdose of iron can be life-threatening. If you were prescribed iron supplements, take them with orange juice or a vitamin C supplement.  Cook foods in pots and pans that are made from iron.  Eat nonheme iron-containing foods alongside foods that are high in heme iron. This helps to improve your iron absorption.  Certain foods and drinks contain compounds that impair iron absorption. Avoid eating these foods in the same meal as iron-rich foods or with iron supplements. These include:  Coffee, black tea, and red wine.  Milk, dairy products, and foods that are high in calcium.  Beans, soybeans, and peas.  Whole grains.  When eating foods that contain both nonheme iron and compounds that impair iron absorption, follow these tips to absorb iron better.  Soak beans overnight before cooking.  Soak whole grains overnight and drain them before using.  Ferment flours before baking, such as using yeast in bread dough. What foods can I eat? Grains  Iron-fortified breakfast cereal. Iron-fortified whole-wheat bread. Enriched rice. Sprouted grains. Vegetables  Spinach. Potatoes with skin. Green peas. Broccoli. Red and green bell peppers. Fermented vegetables. Fruits  Prunes. Raisins. Oranges. Strawberries. Mango. Grapefruit. Meats and Other Protein Sources  Sources  °Beef liver. Oysters. Beef. Shrimp. Turkey. Chicken. Tuna. Sardines. Chickpeas. Nuts. Tofu. °Beverages  °Tomato juice. Fresh orange juice. Prune juice. Hibiscus tea. Fortified instant breakfast shakes. °Condiments  °Tahini. Fermented soy sauce. °Sweets and Desserts  °Black-strap molasses. °Other  °Wheat germ. °The items listed above may not be a complete list of recommended foods or beverages.  Contact your dietitian for more options.  °What foods are not recommended? °Grains  °Whole grains. Bran cereal. Bran flour. Oats. °Vegetables  °Artichokes. Brussels sprouts. Kale. °Fruits  °Blueberries. Raspberries. Strawberries. Figs. °Meats and Other Protein Sources  °Soybeans. Products made from soy protein. °Dairy  °Milk. Cream. Cheese. Yogurt. Cottage cheese. °Beverages  °Coffee. Black tea. Red wine. °Sweets and Desserts  °Cocoa. Chocolate. Ice cream. °Other  °Basil. Oregano. Parsley. °The items listed above may not be a complete list of foods and beverages to avoid. Contact your dietitian for more information.  °This information is not intended to replace advice given to you by your health care provider. Make sure you discuss any questions you have with your health care provider. °Document Released: 06/15/2005 Document Revised: 05/21/2016 Document Reviewed: 05/29/2014 °Elsevier Interactive Patient Education © 2017 [employer] ° ° ° °

## 2017-01-20 NOTE — Progress Notes (Signed)
Name: Miranda Stafford   MRN: [id number]    DOB: January 04, 1956   Date:01/20/2017       Progress Note  Subjective  Chief Complaint  Chief Complaint  Patient presents with  . Dizziness    Onset-4 or 5 days, Every morning she wakes up she has a trouble driving in due to the dizziness. Started having a banana every morning and once she gets to work eating a bagel.  . Conjunctivitis    Onset- this morning, left eye-feels heavy and is red.     HPI   Conjunctivitis: she woke with left eye all red, and drainage, mild itching, no pain or photophobia. She works as a Pharmacist, hospital.  Lightheaded: she states that for the past 4 days she is getting up feeling lightheaded. She gets up slowly and had to pull over while driving this am, denies spinning sensation, no ear pain, fullness, tinnitus or recent URI. No hearing loss. She has noticed frontal headache intermittently for the past week, no nausea or vomiting.   Change in bowel movement: colonoscopy is up to date, no blood in stools, she has been constipated for the past month, bowel movements changed from once a day to one every three to four days. She has hypothyroidism and TSH was high back in 12/2016, but just started new dose of Synthroid and had one bowel movement today, lose stools.    Patient Active Problem List   Diagnosis Date Noted  . Family history of breast cancer in sister 05/24/2016  . Hypothyroidism 09/19/2015  . Asthma, moderate persistent, poorly-controlled 09/19/2015  . Perennial allergic rhinitis with seasonal variation 09/19/2015  . Major depression in remission ([place]) 09/19/2015    Past Surgical History:  Procedure Laterality Date  . ABDOMINAL HYSTERECTOMY    . ADENOIDECTOMY    . CESAREAN SECTION    . TUBAL LIGATION      Family History  Problem Relation Age of Onset  . Heart disease Mother   . Hypertension Mother   . Hyperlipidemia Mother   . Breast cancer Mother 16  . SIDS Son   . Breast cancer Sister 37    Social  History   Social History  . Marital status: Married    Spouse name: N/A  . Number of children: N/A  . Years of education: N/A   Occupational History  . Not on file.   Social History Main Topics  . Smoking status: Former Smoker    Packs/day: 0.50    Years: 5.00    Types: Cigarettes    Quit date: 09/18/1985  . Smokeless tobacco: Never Used  . Alcohol use 0.0 oz/week     Comment: wine  . Drug use: No  . Sexual activity: Yes   Other Topics Concern  . Not on file   Social History Narrative  . No narrative on file     Current Outpatient Prescriptions:  .  albuterol (PROVENTIL HFA;VENTOLIN HFA) 108 (90 Base) MCG/ACT inhaler, Inhale 2 puffs into the lungs every 6 (six) hours as needed for wheezing or shortness of breath., Disp: 1 Inhaler, Rfl: 0 .  atorvastatin (LIPITOR) 40 MG tablet, Take 1 tablet (40 mg total) by mouth daily., Disp: 90 tablet, Rfl: 0 .  fluticasone furoate-vilanterol (BREO ELLIPTA) 100-25 MCG/INH AEPB, Inhale 1 puff into the lungs daily., Disp: 60 each, Rfl: 0 .  levothyroxine (SYNTHROID, LEVOTHROID) 50 MCG tablet, Take 0.5-1 tablets (25-50 mcg total) by mouth daily. Alternate 25 mcg and 50 mcg every other day and recheck  labs in 6 weeks, Disp: 30 tablet, Rfl: 1 .  loratadine (CLARITIN) 10 MG tablet, Take 1 tablet (10 mg total) by mouth daily. (Patient not taking: Reported on 11/24/2016), Disp: 30 tablet, Rfl: 11 .  montelukast (SINGULAIR) 10 MG tablet, Take 1 tablet (10 mg total) by mouth at bedtime. (Patient not taking: Reported on 11/24/2016), Disp: 30 tablet, Rfl: 5  No Known Allergies   ROS  Constitutional: Negative for fever or weight change.  Respiratory: Negative for cough and shortness of breath.   Cardiovascular: Negative for chest pain or palpitations.  Gastrointestinal: Negative for abdominal pain, no bowel changes.  Musculoskeletal: Negative for gait problem or joint swelling.  Skin: Negative for rash.  Neurological: Positive  for dizziness but  no  headache.  No other specific complaints in a complete review of systems (except as listed in HPI above).  Objective  Vitals:   01/20/17 1321  BP: 108/68  Pulse: 80  Resp: 16  Temp: 97.7 F (36.5 C)  TempSrc: Oral  SpO2: 97%  Weight: 117 lb 11.2 oz (53.4 kg)  Height: _0  (1.6 m)    Body mass index is 20.85 kg/m.  Physical Exam  Constitutional: Patient appears well-developed and well-nourished. No distress.  HEENT: head atraumatic, normocephalic, pupils equal and reactive to light, injected conjunctiva on left eye, ears normal TM,  neck supple, throat within normal limits Cardiovascular: Normal rate, regular rhythm and normal heart sounds.  No murmur heard. No BLE edema. Pulmonary/Chest: Effort normal and breath sounds normal. No respiratory distress. Abdominal: Soft.  There is no tenderness. Psychiatric: Patient has a normal mood and affect. behavior is normal. Judgment and thought content normal. Neurological: no focal findings, Romberg negative, cranial nerves intact  Recent Results (from the past 2160 hour(s))  Comprehensive metabolic panel     Status: Abnormal   Collection Time: 11/24/16  8:58 PM  Result Value Ref Range   Sodium 134 (L) 135 - 145 mmol/L   Potassium 4.1 3.5 - 5.1 mmol/L   Chloride 96 (L) 101 - 111 mmol/L   CO2 27 22 - 32 mmol/L   Glucose, Bld 111 (H) 65 - 99 mg/dL   BUN 16 6 - 20 mg/dL   Creatinine, Ser 1.45 (H) 0.44 - 1.00 mg/dL   Calcium 9.5 8.9 - 10.3 mg/dL   Total Protein 9.1 (H) 6.5 - 8.1 g/dL   Albumin 4.6 3.5 - 5.0 g/dL   AST 183 (H) 15 - 41 U/L   ALT 98 (H) 14 - 54 U/L   Alkaline Phosphatase 74 38 - 126 U/L   Total Bilirubin 0.3 0.3 - 1.2 mg/dL   GFR calc non Af Amer 39 (L) >60 mL/min   GFR calc Af Amer 45 (L) >60 mL/min    Comment: (NOTE) The eGFR has been calculated using the CKD EPI equation. This calculation has not been validated in all clinical situations. eGFR's persistently <60 mL/min signify possible Chronic  Kidney Disease.    Anion gap 11 5 - 15  CBC WITH DIFFERENTIAL     Status: Abnormal   Collection Time: 11/24/16  8:58 PM  Result Value Ref Range   WBC 5.5 3.6 - 11.0 K/uL   RBC 4.86 3.80 - 5.20 MIL/uL   Hemoglobin 13.0 12.0 - 16.0 g/dL   HCT 39.4 35.0 - 47.0 %   MCV 81.0 80.0 - 100.0 fL   MCH 26.7 26.0 - 34.0 pg   MCHC 33.0 32.0 - 36.0 g/dL   RDW 14.6 (H)  11.5 - 14.5 %   Platelets 209 150 - 440 K/uL   Neutrophils Relative % 78 %   Neutro Abs 4.3 1.4 - 6.5 K/uL   Lymphocytes Relative 14 %   Lymphs Abs 0.8 (L) 1.0 - 3.6 K/uL   Monocytes Relative 7 %   Monocytes Absolute 0.4 0.2 - 0.9 K/uL   Eosinophils Relative 0 %   Eosinophils Absolute 0.0 0 - 0.7 K/uL   Basophils Relative 1 %   Basophils Absolute 0.0 0 - 0.1 K/uL  Blood Culture (routine x 2)     Status: None   Collection Time: 11/24/16  8:58 PM  Result Value Ref Range   Specimen Description BLOOD LEFT ANTECUBITAL    Special Requests      BOTTLES DRAWN AEROBIC AND ANAEROBIC  AER 12CC ANA 8CC   Culture NO GROWTH 5 DAYS    Report Status 11/29/2016 FINAL   TSH     Status: Abnormal   Collection Time: 11/24/16  8:58 PM  Result Value Ref Range   TSH 4.616 (H) 0.350 - 4.500 uIU/mL    Comment: Performed by a 3rd Generation assay with a functional sensitivity of <=0.01 uIU/mL.  Lactic acid, plasma     Status: None   Collection Time: 11/24/16  9:03 PM  Result Value Ref Range   Lactic Acid, Venous 1.7 0.5 - 1.9 mmol/L  Rapid Influenza A&B Antigens ([facility] only)     Status: None   Collection Time: 11/24/16  9:22 PM  Result Value Ref Range   Influenza A ([facility]) NEGATIVE NEGATIVE   Influenza B ([facility]) NEGATIVE NEGATIVE  Blood Culture (routine x 2)     Status: None   Collection Time: 11/24/16  9:49 PM  Result Value Ref Range   Specimen Description BLOOD  R AC     Special Requests      BOTTLES DRAWN AEROBIC AND ANAEROBIC  AER 12 ML ANA 12 ML   Culture NO GROWTH 5 DAYS    Report Status 11/29/2016 FINAL   Urine culture     Status:  None   Collection Time: 11/24/16 11:03 PM  Result Value Ref Range   Specimen Description URINE, RANDOM    Special Requests NONE    Culture NO GROWTH Performed at [facility]     Report Status 11/26/2016 FINAL   Urinalysis, Complete w Microscopic     Status: Abnormal   Collection Time: 11/24/16 11:03 PM  Result Value Ref Range   Color, Urine STRAW (A) YELLOW   APPearance CLEAR (A) CLEAR   Specific Gravity, Urine 1.009 1.005 - 1.030   pH 5.0 5.0 - 8.0   Glucose, UA NEGATIVE NEGATIVE mg/dL   Hgb urine dipstick MODERATE (A) NEGATIVE   Bilirubin Urine NEGATIVE NEGATIVE   Ketones, ur 5 (A) NEGATIVE mg/dL   Protein, ur NEGATIVE NEGATIVE mg/dL   Nitrite NEGATIVE NEGATIVE   Leukocytes, UA NEGATIVE NEGATIVE   RBC / HPF 0-5 0 - 5 RBC/hpf   WBC, UA 0-5 0 - 5 WBC/hpf   Bacteria, UA NONE SEEN NONE SEEN   Squamous Epithelial / LPF 0-5 (A) NONE SEEN   Mucous PRESENT   Influenza panel by PCR (type A & B, H1N1)     Status: Abnormal   Collection Time: 11/24/16 11:03 PM  Result Value Ref Range   Influenza A By PCR POSITIVE (A) NEGATIVE   Influenza B By PCR NEGATIVE NEGATIVE    Comment: (NOTE) The Xpert Xpress Flu assay is intended as  an aid in the diagnosis of  influenza and should not be used as a sole basis for treatment.  This  assay is FDA approved for nasopharyngeal swab specimens only. Nasal  washings and aspirates are unacceptable for Xpert Xpress Flu testing.   Lactic acid, plasma     Status: None   Collection Time: 11/25/16 12:03 AM  Result Value Ref Range   Lactic Acid, Venous 1.1 0.5 - 1.9 mmol/L  CBC     Status: Abnormal   Collection Time: 11/25/16  4:05 AM  Result Value Ref Range   WBC 3.4 (L) 3.6 - 11.0 K/uL   RBC 3.97 3.80 - 5.20 MIL/uL   Hemoglobin 10.8 (L) 12.0 - 16.0 g/dL   HCT 33.0 (L) 35.0 - 47.0 %   MCV 83.0 80.0 - 100.0 fL   MCH 27.2 26.0 - 34.0 pg   MCHC 32.8 32.0 - 36.0 g/dL   RDW 14.6 (H) 11.5 - 14.5 %   Platelets 169 150 - 440 K/uL  Basic metabolic  panel     Status: Abnormal   Collection Time: 11/25/16  4:05 AM  Result Value Ref Range   Sodium 139 135 - 145 mmol/L   Potassium 4.1 3.5 - 5.1 mmol/L   Chloride 113 (H) 101 - 111 mmol/L   CO2 21 (L) 22 - 32 mmol/L   Glucose, Bld 88 65 - 99 mg/dL   BUN 13 6 - 20 mg/dL   Creatinine, Ser 1.02 (H) 0.44 - 1.00 mg/dL   Calcium 7.4 (L) 8.9 - 10.3 mg/dL   GFR calc non Af Amer 59 (L) >60 mL/min   GFR calc Af Amer >60 >60 mL/min    Comment: (NOTE) The eGFR has been calculated using the CKD EPI equation. This calculation has not been validated in all clinical situations. eGFR's persistently <60 mL/min signify possible Chronic Kidney Disease.    Anion gap 5 5 - 15  COMPLETE METABOLIC PANEL WITH GFR     Status: Abnormal   Collection Time: 12/30/16  8:10 AM  Result Value Ref Range   Sodium 139 135 - 146 mmol/L   Potassium 4.0 3.5 - 5.3 mmol/L   Chloride 104 98 - 110 mmol/L   CO2 25 20 - 31 mmol/L   Glucose, Bld 85 65 - 99 mg/dL   BUN 8 7 - 25 mg/dL   Creat 1.05 0.50 - 1.05 mg/dL    Comment:   For patients > or = 59 years of age: The upper reference limit for Creatinine is approximately 13% higher for people identified as African-American.      Total Bilirubin 0.6 0.2 - 1.2 mg/dL   Alkaline Phosphatase 34 33 - 130 U/L   AST 25 10 - 35 U/L   ALT 11 6 - 29 U/L   Total Protein 7.4 6.1 - 8.1 g/dL   Albumin 4.5 3.6 - 5.1 g/dL   Calcium 9.3 8.6 - 10.4 mg/dL   GFR, Est African American 68 >=60 mL/min   GFR, Est Non African American 59 (L) >=60 mL/min  CBC with Differential/Platelet     Status: Abnormal   Collection Time: 12/30/16  8:10 AM  Result Value Ref Range   WBC 3.4 (L) 3.8 - 10.8 K/uL   RBC 3.92 3.80 - 5.10 MIL/uL   Hemoglobin 10.4 (L) 11.7 - 15.5 g/dL   HCT 33.0 (L) 35.0 - 45.0 %   MCV 84.2 80.0 - 100.0 fL   MCH 26.5 (L) 27.0 - 33.0 pg  MCHC 31.5 (L) 32.0 - 36.0 g/dL   RDW 16.1 (H) 11.0 - 15.0 %   Platelets 206 140 - 400 K/uL   MPV 10.6 7.5 - 12.5 fL   Neutro Abs 612 (L)  1,500 - 7,800 cells/uL   Lymphs Abs 2,210 850 - 3,900 cells/uL   Monocytes Absolute 170 (L) 200 - 950 cells/uL   Eosinophils Absolute 340 15 - 500 cells/uL   Basophils Absolute 68 0 - 200 cells/uL   Neutrophils Relative % 18 %   Lymphocytes Relative 65 %   Monocytes Relative 5 %   Eosinophils Relative 10 %   Basophils Relative 2 %   Smear Review Criteria for review not met   Lipid panel     Status: Abnormal   Collection Time: 12/30/16  8:10 AM  Result Value Ref Range   Cholesterol 290 (H) <200 mg/dL   Triglycerides 74 <150 mg/dL   HDL 94 >50 mg/dL   Total CHOL/HDL Ratio 3.1 <5.0 Ratio   VLDL 15 <30 mg/dL   LDL Cholesterol 181 (H) <100 mg/dL  TSH     Status: Abnormal   Collection Time: 12/30/16  8:10 AM  Result Value Ref Range   TSH 9.37 (H) mIU/L    Comment:   Reference Range   > or = 20 Years  0.40-4.50   Pregnancy Range First trimester  0.26-2.66 Second trimester 0.55-2.73 Third trimester  0.43-2.91         PHQ2/9: Depression screen [facility] 2/9 12/03/2016 10/18/2016 05/24/2016 09/19/2015 09/19/2015  Decreased Interest 0 0 0 0 0  Down, Depressed, Hopeless 0 0 0 0 0  PHQ - 2 Score 0 0 0 0 0     Fall Risk: Fall Risk  12/03/2016 10/18/2016 05/24/2016 09/19/2015  Falls in the past year? No No No No      Assessment & Plan  1. Pink eye disease of left eye  - Visual acuity screening - ciprofloxacin (CILOXAN) 0.3 % ophthalmic solution; Place 2 drops into the left eye every 2 (two) hours while awake. Administer 1 drop, every 2 hours, while awake, for 2 days. Then 1 drop, every 4 hours, while awake, for the next 5 days.  Dispense: 5 mL; Refill: 0  2. Lightheaded  Explained it may be from anemia, we will recheck labs, she also just started taking 50 mcg of levothyroxine yesterday and thyroid is off.   3. Anemia, unspecified type  - CBC with Differential/Platelet - Iron, TIBC and Ferritin Panel - POC Hemoccult Bld/Stl (3-Cd Home Screen); Future  4. Hypothyroidism,  adult  - TSH  5. Dyslipidemia  She has not started lipitor yet, scared, but she will try life style modification

## 2017-01-21 ENCOUNTER — Other Ambulatory Visit: Payer: Self-pay | Admitting: Family Medicine

## 2017-01-21 DIAGNOSIS — D72819 Decreased white blood cell count, unspecified: Secondary | ICD-10-CM

## 2017-01-21 DIAGNOSIS — D649 Anemia, unspecified: Secondary | ICD-10-CM

## 2017-02-10 ENCOUNTER — Encounter: Payer: Self-pay | Admitting: Oncology

## 2017-02-10 ENCOUNTER — Inpatient Hospital Stay: Payer: BC Managed Care – PPO | Attending: Oncology | Admitting: Oncology

## 2017-02-10 ENCOUNTER — Inpatient Hospital Stay: Payer: BC Managed Care – PPO

## 2017-02-10 VITALS — BP 136/81 | HR 71 | Temp 92.8°F | Resp 18 | Ht 63.39 in | Wt 118.0 lb

## 2017-02-10 DIAGNOSIS — Z79899 Other long term (current) drug therapy: Secondary | ICD-10-CM | POA: Diagnosis not present

## 2017-02-10 DIAGNOSIS — F325 Major depressive disorder, single episode, in full remission: Secondary | ICD-10-CM | POA: Insufficient documentation

## 2017-02-10 DIAGNOSIS — D649 Anemia, unspecified: Secondary | ICD-10-CM

## 2017-02-10 DIAGNOSIS — G4700 Insomnia, unspecified: Secondary | ICD-10-CM | POA: Diagnosis not present

## 2017-02-10 DIAGNOSIS — E785 Hyperlipidemia, unspecified: Secondary | ICD-10-CM | POA: Insufficient documentation

## 2017-02-10 DIAGNOSIS — J4540 Moderate persistent asthma, uncomplicated: Secondary | ICD-10-CM | POA: Insufficient documentation

## 2017-02-10 DIAGNOSIS — E039 Hypothyroidism, unspecified: Secondary | ICD-10-CM | POA: Diagnosis not present

## 2017-02-10 DIAGNOSIS — Z87891 Personal history of nicotine dependence: Secondary | ICD-10-CM | POA: Insufficient documentation

## 2017-02-10 DIAGNOSIS — M8580 Other specified disorders of bone density and structure, unspecified site: Secondary | ICD-10-CM | POA: Diagnosis not present

## 2017-02-10 DIAGNOSIS — D709 Neutropenia, unspecified: Secondary | ICD-10-CM

## 2017-02-10 LAB — RETICULOCYTES
RBC.: 3.64 MIL/uL — ABNORMAL LOW (ref 3.80–5.20)
Retic Count, Absolute: 43.7 10*3/uL (ref 19.0–183.0)
Retic Ct Pct: 1.2 % (ref 0.4–3.1)

## 2017-02-10 LAB — COMPREHENSIVE METABOLIC PANEL
ALT: 16 U/L (ref 14–54)
AST: 31 U/L (ref 15–41)
Albumin: 4.5 g/dL (ref 3.5–5.0)
Alkaline Phosphatase: 35 U/L — ABNORMAL LOW (ref 38–126)
Anion gap: 4 — ABNORMAL LOW (ref 5–15)
BUN: 10 mg/dL (ref 6–20)
CO2: 26 mmol/L (ref 22–32)
Calcium: 9.3 mg/dL (ref 8.9–10.3)
Chloride: 107 mmol/L (ref 101–111)
Creatinine, Ser: 0.84 mg/dL (ref 0.44–1.00)
GFR calc Af Amer: >60 mL/min (ref >60)
GFR calc non Af Amer: >60 mL/min (ref >60)
Glucose, Bld: 82 mg/dL (ref 65–99)
Potassium: 3.6 mmol/L (ref 3.5–5.1)
Sodium: 137 mmol/L (ref 135–145)
Total Bilirubin: 0.6 mg/dL (ref 0.3–1.2)
Total Protein: 7.8 g/dL (ref 6.5–8.1)

## 2017-02-10 LAB — CBC
HCT: 30 % — ABNORMAL LOW (ref 35.0–47.0)
Hemoglobin: 9.9 g/dL — ABNORMAL LOW (ref 12.0–16.0)
MCH: 27.3 pg (ref 26.0–34.0)
MCHC: 32.9 g/dL (ref 32.0–36.0)
MCV: 83 fL (ref 80.0–100.0)
Platelets: 198 10*3/uL (ref 150–440)
RBC: 3.61 MIL/uL — ABNORMAL LOW (ref 3.80–5.20)
RDW: 14.6 % — ABNORMAL HIGH (ref 11.5–14.5)
WBC: 3.1 10*3/uL — ABNORMAL LOW (ref 3.6–11.0)

## 2017-02-10 LAB — DIFFERENTIAL
Basophils Absolute: 0 10*3/uL (ref 0–0.1)
Basophils Relative: 1 %
Eosinophils Absolute: 0.4 10*3/uL (ref 0–0.7)
Eosinophils Relative: 12 %
Lymphocytes Relative: 52 %
Lymphs Abs: 1.6 10*3/uL (ref 1.0–3.6)
Monocytes Absolute: 0.2 10*3/uL (ref 0.2–0.9)
Monocytes Relative: 5 %
Neutro Abs: 0.9 10*3/uL — ABNORMAL LOW (ref 1.4–6.5)
Neutrophils Relative %: 30 %

## 2017-02-10 LAB — IRON AND TIBC
Iron: 63 ug/dL (ref 28–170)
Saturation Ratios: 23 % (ref 10.4–31.8)
TIBC: 269 ug/dL (ref 250–450)
UIBC: 206 ug/dL

## 2017-02-10 LAB — VITAMIN B12: Vitamin B-12: 206 pg/mL (ref 180–914)

## 2017-02-10 LAB — FOLATE: Folate: 11.9 ng/mL (ref >5.9)

## 2017-02-10 LAB — FERRITIN: Ferritin: 90 ng/mL (ref 11–307)

## 2017-02-10 NOTE — Progress Notes (Signed)
Here referred by Dr Gwynneth Aliment for anemia.

## 2017-02-10 NOTE — Progress Notes (Signed)
Hematology/Oncology Consult note [facility] Telephone:([phone] [phone]  Patient Care Team: Steele Sizer, MD as PCP - General (Family Medicine)   Name of the patient: Miranda Stafford  [id number]  [birth year]    Reason for referral- Anemia   Referring physician- Dr. Steele Sizer  Date of visit: 02/10/17   History of presenting illness- patient is a 61 year old female who has been referred to [place] for evaluation and management of normocytic anemia. CBC on 01/20/2017 showed white count of 3.4, H&H of 10.4/32.6 with an MCV of 84.5 and a platelet count of 205. TSH was elevated at 6.0. Iron studies showed a normal ferritin of 161. TIBC was slightly low at 247 and serum iron was normal at 58. Patient was admitted to the hospital in February 2018 for influenza and dehydration. Patient does have baseline asthma. She also reports that she has had low white count in the past as well and has seen a hematologist for a year or so and then stopped  ECOG PS- 0  Pain scale- 0   Review of systems- Review of Systems  Constitutional: Positive for malaise/fatigue. Negative for chills, fever and weight loss.  HENT: Negative for congestion, ear discharge and nosebleeds.   Eyes: Negative for blurred vision.  Respiratory: Negative for cough, hemoptysis, sputum production, shortness of breath and wheezing.   Cardiovascular: Negative for chest pain, palpitations, orthopnea and claudication.  Gastrointestinal: Negative for abdominal pain, blood in stool, constipation, diarrhea, heartburn, melena, nausea and vomiting.  Genitourinary: Negative for dysuria, flank pain, frequency, hematuria and urgency.  Musculoskeletal: Negative for back pain, joint pain and myalgias.  Skin: Negative for rash.  Neurological: Negative for dizziness, tingling, focal weakness, seizures, weakness and headaches.  Endo/Heme/Allergies: Does not bruise/bleed easily.  Psychiatric/Behavioral:  Negative for depression and suicidal ideas. The patient does not have insomnia.     No Known Allergies  Patient Active Problem List   Diagnosis Date Noted  . Family history of breast cancer in sister 05/24/2016  . Hypothyroidism 09/19/2015  . Asthma, moderate persistent, poorly-controlled 09/19/2015  . Perennial allergic rhinitis with seasonal variation 09/19/2015  . Major depression in remission ([place]) 09/19/2015     Past Medical History:  Diagnosis Date  . Allergy   . Asthma   . Fibroid tumor   . Herpes zoster   . Hyperlipidemia   . Insomnia   . Osteopenia   . Thyroid disease      Past Surgical History:  Procedure Laterality Date  . ABDOMINAL HYSTERECTOMY  2005   fibroid tumor  . ADENOIDECTOMY    . CESAREAN SECTION    . TUBAL LIGATION      Social History   Social History  . Marital status: Married    Spouse name: N/A  . Number of children: N/A  . Years of education: N/A   Occupational History  . Not on file.   Social History Main Topics  . Smoking status: Former Smoker    Packs/day: 0.50    Years: 5.00    Types: Cigarettes    Quit date: 09/18/1985  . Smokeless tobacco: Never Used  . Alcohol use 0.0 oz/week     Comment: wine/socially/rarely  . Drug use: No  . Sexual activity: Yes   Other Topics Concern  . Not on file   Social History Narrative  . No narrative on file     Family History  Problem Relation Age of Onset  . Heart disease Mother   . Hypertension Mother   .  Hyperlipidemia Mother   . SIDS Son   . Breast cancer Sister 29  . Hypothyroidism Sister   . Hypertension Sister      Current Outpatient Prescriptions:  .  albuterol (PROVENTIL HFA;VENTOLIN HFA) 108 (90 Base) MCG/ACT inhaler, Inhale 2 puffs into the lungs every 6 (six) hours as needed for wheezing or shortness of breath., Disp: 1 Inhaler, Rfl: 0 .  fluticasone furoate-vilanterol (BREO ELLIPTA) 100-25 MCG/INH AEPB, Inhale 1 puff into the lungs daily., Disp: 60 each, Rfl: 0 .   levothyroxine (SYNTHROID, LEVOTHROID) 50 MCG tablet, Take 0.5-1 tablets (25-50 mcg total) by mouth daily. Alternate 25 mcg and 50 mcg every other day and recheck labs in 6 weeks, Disp: 30 tablet, Rfl: 1 .  loratadine (CLARITIN) 10 MG tablet, Take 1 tablet (10 mg total) by mouth daily. (Patient not taking: Reported on 11/24/2016), Disp: 30 tablet, Rfl: 11   Physical exam:  Vitals:   02/10/17 1332  BP: 136/81  Pulse: 71  Resp: 18  Temp: (!) 92.8 F (33.8 C)  TempSrc: Tympanic  Weight: 118 lb (53.5 kg)  Height: 5' 3.39" (1.61 m)   Physical Exam  Constitutional: She is oriented to person, place, and time and well-developed, well-nourished, and in no distress.  HENT:  Head: Normocephalic and atraumatic.  Eyes: EOM are normal. Pupils are equal, round, and reactive to light.  Neck: Normal range of motion.  Cardiovascular: Normal rate, regular rhythm and normal heart sounds.   Pulmonary/Chest: Effort normal and breath sounds normal.  Abdominal: Soft. Bowel sounds are normal.  Neurological: She is alert and oriented to person, place, and time.  Skin: Skin is warm and dry.       CMP Latest Ref Rng & Units 02/10/2017  Glucose 65 - 99 mg/dL 82  BUN 6 - 20 mg/dL 10  Creatinine 8.74 - 2.54 mg/dL 9.38  Sodium 235 - 105 mmol/L 137  Potassium 3.5 - 5.1 mmol/L 3.6  Chloride 101 - 111 mmol/L 107  CO2 22 - 32 mmol/L 26  Calcium 8.9 - 10.3 mg/dL 9.3  Total Protein 6.5 - 8.1 g/dL 7.8  Total Bilirubin 0.3 - 1.2 mg/dL 0.6  Alkaline Phos 38 - 126 U/L 35(L)  AST 15 - 41 U/L 31  ALT 14 - 54 U/L 16   CBC Latest Ref Rng & Units 02/10/2017  WBC 3.6 - 11.0 K/uL 3.1(L)  Hemoglobin 12.0 - 16.0 g/dL 0.4(Q)  Hematocrit 30.6 - 47.0 % 30.0(L)  Platelets 150 - 440 K/uL 198     Assessment and plan- Patient is a 61 y.o. female who has been referred to [place] for evaluation and management of normocytic anemia  Normocytic anemia could be secondary to underlying hypothyroidism is well which is currently being  regulated and patient increase her levothyroxine dosage to 50 g about 2 weeks back. Today I will obtain a complete anemia workup including B12, folate, reticulocyte count, haptoglobin, myeloma panel, CMP, ANA testing along with a repeat CBC. I will see the patient back in 2 weeks' time to discuss the results of her blood work and further workup I will also get a review of her peripheral smear  Patient states that she has had low white count in the past and recent CBC from January 2018 that showed that she has mild-to-moderate neutropenia as well. If this worsens with time I will consider a bone marrow biopsy at that time.   Thank you for this kind referral and the opportunity to participate in the care of  this patient   Visit Diagnosis 1. Normocytic anemia   2. Neutropenia, unspecified type [facility])     Dr. Randa Evens, MD, MPH [facility] at [facility] Pager- [phone] 02/10/2017 3:59 PM

## 2017-02-11 LAB — MULTIPLE MYELOMA PANEL, SERUM
Albumin SerPl Elph-Mcnc: 4.2 g/dL (ref 2.9–4.4)
Albumin/Glob SerPl: 1.5 (ref 0.7–1.7)
Alpha 1: 0.2 g/dL (ref 0.0–0.4)
Alpha2 Glob SerPl Elph-Mcnc: 0.8 g/dL (ref 0.4–1.0)
B-Globulin SerPl Elph-Mcnc: 1 g/dL (ref 0.7–1.3)
Gamma Glob SerPl Elph-Mcnc: 1 g/dL (ref 0.4–1.8)
Globulin, Total: 2.9 g/dL (ref 2.2–3.9)
IgA: 236 mg/dL (ref 87–352)
IgG (Immunoglobin G), Serum: 904 mg/dL (ref 700–1600)
IgM, Serum: 89 mg/dL (ref 26–217)
Total Protein ELP: 7.1 g/dL (ref 6.0–8.5)

## 2017-02-11 LAB — PATHOLOGIST SMEAR REVIEW

## 2017-02-11 LAB — ANA W/REFLEX: Anti Nuclear Antibody(ANA): NEGATIVE

## 2017-02-11 LAB — HAPTOGLOBIN: Haptoglobin: 121 mg/dL (ref 34–200)

## 2017-02-24 ENCOUNTER — Inpatient Hospital Stay: Payer: BC Managed Care – PPO | Attending: Oncology | Admitting: Oncology

## 2017-02-24 VITALS — BP 103/68 | HR 80 | Temp 98.1°F | Resp 18 | Wt 117.3 lb

## 2017-02-24 DIAGNOSIS — Z79899 Other long term (current) drug therapy: Secondary | ICD-10-CM | POA: Diagnosis not present

## 2017-02-24 DIAGNOSIS — G4700 Insomnia, unspecified: Secondary | ICD-10-CM | POA: Diagnosis not present

## 2017-02-24 DIAGNOSIS — E785 Hyperlipidemia, unspecified: Secondary | ICD-10-CM | POA: Insufficient documentation

## 2017-02-24 DIAGNOSIS — J45909 Unspecified asthma, uncomplicated: Secondary | ICD-10-CM | POA: Diagnosis not present

## 2017-02-24 DIAGNOSIS — E538 Deficiency of other specified B group vitamins: Secondary | ICD-10-CM

## 2017-02-24 DIAGNOSIS — R221 Localized swelling, mass and lump, neck: Secondary | ICD-10-CM | POA: Diagnosis not present

## 2017-02-24 DIAGNOSIS — Z87891 Personal history of nicotine dependence: Secondary | ICD-10-CM | POA: Diagnosis not present

## 2017-02-24 DIAGNOSIS — D72819 Decreased white blood cell count, unspecified: Secondary | ICD-10-CM | POA: Diagnosis not present

## 2017-02-24 DIAGNOSIS — D649 Anemia, unspecified: Secondary | ICD-10-CM | POA: Diagnosis not present

## 2017-02-24 DIAGNOSIS — D709 Neutropenia, unspecified: Secondary | ICD-10-CM

## 2017-02-24 NOTE — Progress Notes (Signed)
Patient offers no concerns today. 

## 2017-02-25 ENCOUNTER — Encounter: Payer: Self-pay | Admitting: Oncology

## 2017-02-25 DIAGNOSIS — E538 Deficiency of other specified B group vitamins: Secondary | ICD-10-CM | POA: Insufficient documentation

## 2017-02-25 NOTE — Progress Notes (Signed)
Hematology/Oncology Consult note [facility]  Telephone:([phone] [phone]  Patient Care Team: Steele Sizer, MD as PCP - General (Family Medicine)   Name of the patient: Miranda Stafford  [id number]  1956-10-27   Date of visit: 02/25/17  Diagnosis- neutropenia and anemia of unclear etiology  Chief complaint/ Reason for visit- discuss results of bloodwork  Heme/Onc history: patient is a 61 year old female who has been referred to [place] for evaluation and management of normocytic anemia. CBC on 01/20/2017 showed white count of 3.4, H&H of 10.4/32.6 with an MCV of 84.5 and a platelet count of 205. TSH was elevated at 6.0. Iron studies showed a normal ferritin of 161. TIBC was slightly low at 247 and serum iron was normal at 58. Patient was admitted to the hospital in February 2018 for influenza and dehydration. Patient does have baseline asthma. She also reports that she has had low white count in the past as well and has seen a hematologist for a year or so and then stopped  Results of bloodwork from 02/10/2017 were as follows: CBC showed white count of 3.1, H&H of 9.9/30 with an MCV of 83 and a platelet count of 198. Differential showed neutropenia with an ANC of 0.9. Review of peripheral smear showed normocytic anemia and neutropenia. Unremarkable morphology. Ferritin and iron studies were within normal limits. B12 levels were low at 206. Folate was normal. Reticulocyte count was 1.2% which was low for the degree of anemia. ANA was negative. Multiple myeloma panel showed no monoclonal protein. Haptoglobin was normal at 121.   Interval history- patient reports bilateral neck swelling which is particularly worse or early morning and says that it tends to get better during the day. She has been working with Dr. Teola Bradley to adjust her levothyroxine dose.  ECOG PS- 0 Pain scale- 0 Opioid associated constipation- n/a  Review of systems- Review of Systems    Constitutional: Positive for malaise/fatigue. Negative for chills, fever and weight loss.  HENT: Negative for congestion, ear discharge and nosebleeds.        Neck swelling  Eyes: Negative for blurred vision.  Respiratory: Negative for cough, hemoptysis, sputum production, shortness of breath and wheezing.   Cardiovascular: Negative for chest pain, palpitations, orthopnea and claudication.  Gastrointestinal: Negative for abdominal pain, blood in stool, constipation, diarrhea, heartburn, melena, nausea and vomiting.  Genitourinary: Negative for dysuria, flank pain, frequency, hematuria and urgency.  Musculoskeletal: Negative for back pain, joint pain and myalgias.  Skin: Negative for rash.  Neurological: Negative for dizziness, tingling, focal weakness, seizures, weakness and headaches.  Endo/Heme/Allergies: Does not bruise/bleed easily.  Psychiatric/Behavioral: Negative for depression and suicidal ideas. The patient does not have insomnia.        No Known Allergies   Past Medical History:  Diagnosis Date  . Allergy   . Asthma   . Fibroid tumor   . Herpes zoster   . Hyperlipidemia   . Insomnia   . Osteopenia   . Thyroid disease      Past Surgical History:  Procedure Laterality Date  . ABDOMINAL HYSTERECTOMY  2005   fibroid tumor  . ADENOIDECTOMY    . CESAREAN SECTION    . TUBAL LIGATION      Social History   Social History  . Marital status: Married    Spouse name: N/A  . Number of children: N/A  . Years of education: N/A   Occupational History  . Not on file.   Social History  Main Topics  . Smoking status: Former Smoker    Packs/day: 0.50    Years: 5.00    Types: Cigarettes    Quit date: 09/18/1985  . Smokeless tobacco: Never Used  . Alcohol use 0.0 oz/week     Comment: wine/socially/rarely  . Drug use: No  . Sexual activity: Yes   Other Topics Concern  . Not on file   Social History Narrative  . No narrative on file    Family History  Problem  Relation Age of Onset  . Heart disease Mother   . Hypertension Mother   . Hyperlipidemia Mother   . SIDS Son   . Breast cancer Sister 74  . Hypothyroidism Sister   . Hypertension Sister      Current Outpatient Prescriptions:  .  cetirizine (ZYRTEC) 10 MG tablet, Take 10 mg by mouth daily., Disp: , Rfl:  .  fluticasone furoate-vilanterol (BREO ELLIPTA) 100-25 MCG/INH AEPB, Inhale 1 puff into the lungs daily., Disp: 60 each, Rfl: 0 .  levothyroxine (SYNTHROID, LEVOTHROID) 50 MCG tablet, Take 0.5-1 tablets (25-50 mcg total) by mouth daily. Alternate 25 mcg and 50 mcg every other day and recheck labs in 6 weeks, Disp: 30 tablet, Rfl: 1 .  albuterol (PROVENTIL HFA;VENTOLIN HFA) 108 (90 Base) MCG/ACT inhaler, Inhale 2 puffs into the lungs every 6 (six) hours as needed for wheezing or shortness of breath. (Patient not taking: Reported on 02/24/2017), Disp: 1 Inhaler, Rfl: 0  Physical exam:  Vitals:   02/24/17 1544  BP: 103/68  Pulse: 80  Resp: 18  Temp: 98.1 F (36.7 C)  TempSrc: Tympanic  Weight: 117 lb 5 oz (53.2 kg)   Physical Exam  Constitutional: She is oriented to person, place, and time and well-developed, well-nourished, and in no distress.  HENT:  Head: Normocephalic and atraumatic.  Eyes: EOM are normal. Pupils are equal, round, and reactive to light.  Neck: Normal range of motion.  Cardiovascular: Normal rate, regular rhythm and normal heart sounds.   Pulmonary/Chest: Effort normal and breath sounds normal.  Abdominal: Soft. Bowel sounds are normal.  Lymphadenopathy:  No no palpable cervical adenopathy  Neurological: She is alert and oriented to person, place, and time.  Skin: Skin is warm and dry.     CMP Latest Ref Rng & Units 02/10/2017  Glucose 65 - 99 mg/dL 82  BUN 6 - 20 mg/dL 10  Creatinine 0.44 - 1.00 mg/dL 0.84  Sodium 135 - 145 mmol/L 137  Potassium 3.5 - 5.1 mmol/L 3.6  Chloride 101 - 111 mmol/L 107  CO2 22 - 32 mmol/L 26  Calcium 8.9 - 10.3 mg/dL 9.3    Total Protein 6.5 - 8.1 g/dL 7.8  Total Bilirubin 0.3 - 1.2 mg/dL 0.6  Alkaline Phos 38 - 126 U/L 35(L)  AST 15 - 41 U/L 31  ALT 14 - 54 U/L 16   CBC Latest Ref Rng & Units 02/10/2017  WBC 3.6 - 11.0 K/uL 3.1(L)  Hemoglobin 12.0 - 16.0 g/dL 9.9(L)  Hematocrit 35.0 - 47.0 % 30.0(L)  Platelets 150 - 440 K/uL 198      Assessment and plan- Patient is a 61 y.o. female who has been referred to [place] for neutropenia and anemia  Patient had a normal white count up until November 2016 and since then she has been leukopenic with a white count between 3.1-3.4. Her ANC in the past has been within normal limits but in February and March 2018 her absolute neutrophil count was 612 and  748 respectively indicative moderate neutropenia. No recent changes in her medications or use of over-the-counter medications. Also on review of her anemia workup the only significant finding was a low B12 level of 206. I will start her on monthly B12 injections. However given the concomitant neutropenia and anemia I would like to proceed with a bone marrow biopsy to rule out a primary hematologic disorder. Patient continues to work with her levothyroxine dosage and her last TSH was still high at 6.02 which may be partly contributing towards her anemia and neutropenia.  Patient reports bilateral neck swelling but it did not outweigh any cervical adenopathy. I will get an ultrasound of her neck for further evaluation.  I will see the patient back in 2-3 weeks after her bone marrow biopsy to discuss results and further management    Visit Diagnosis 1. Neck swelling   2. B12 deficiency   3. Normocytic anemia   4. Neutropenia, unspecified type [facility])      Dr. Randa Evens, MD, MPH [facility] Pager- [phone] 02/25/2017 12:57 PM

## 2017-02-25 NOTE — Addendum Note (Signed)
Addended by: Luella Cook on: 02/25/2017 01:34 PM   Modules accepted: Orders

## 2017-02-28 ENCOUNTER — Inpatient Hospital Stay: Payer: BC Managed Care – PPO

## 2017-02-28 DIAGNOSIS — E538 Deficiency of other specified B group vitamins: Secondary | ICD-10-CM

## 2017-02-28 DIAGNOSIS — D649 Anemia, unspecified: Secondary | ICD-10-CM | POA: Diagnosis not present

## 2017-02-28 MED ORDER — CYANOCOBALAMIN 1000 MCG/ML IJ SOLN
1000.0000 ug | Freq: Once | INTRAMUSCULAR | Status: AC
  Administered 2017-02-28: 1000 ug via INTRAMUSCULAR
  Filled 2017-02-28: qty 1

## 2017-03-01 ENCOUNTER — Other Ambulatory Visit: Payer: Self-pay | Admitting: Oncology

## 2017-03-01 ENCOUNTER — Other Ambulatory Visit: Payer: Self-pay | Admitting: *Deleted

## 2017-03-01 ENCOUNTER — Ambulatory Visit: Payer: BC Managed Care – PPO

## 2017-03-01 DIAGNOSIS — D649 Anemia, unspecified: Secondary | ICD-10-CM

## 2017-03-01 DIAGNOSIS — D709 Neutropenia, unspecified: Secondary | ICD-10-CM

## 2017-03-02 ENCOUNTER — Other Ambulatory Visit: Payer: Self-pay | Admitting: *Deleted

## 2017-03-02 DIAGNOSIS — D649 Anemia, unspecified: Secondary | ICD-10-CM

## 2017-03-02 DIAGNOSIS — D709 Neutropenia, unspecified: Secondary | ICD-10-CM

## 2017-03-03 ENCOUNTER — Telehealth: Payer: Self-pay

## 2017-03-03 ENCOUNTER — Other Ambulatory Visit: Payer: Self-pay | Admitting: Oncology

## 2017-03-03 NOTE — Telephone Encounter (Signed)
Call to pt to inform her of date/ time instructions for bone marrow 4/26 @ 1030 am . Aware to be NPO after MN,to hosp 0930 am-and to have a driver . Acknowledged understanding.

## 2017-03-04 ENCOUNTER — Telehealth: Payer: Self-pay | Admitting: *Deleted

## 2017-03-04 NOTE — Telephone Encounter (Signed)
Pt called back and said that the new date for bx is ok for her.

## 2017-03-04 NOTE — Telephone Encounter (Signed)
Called pt to let her know that specials had to r/s her appt because they had too many cases on for that date and moved her to 4/27 arrive at 9 am and procedure at 10 am. All the instructions from yest. Still the same procedure just a different date. I wanted her to call me back and see if the new date is ok. Left my number to rtn the call.

## 2017-03-08 ENCOUNTER — Other Ambulatory Visit: Payer: Self-pay | Admitting: Radiology

## 2017-03-10 ENCOUNTER — Ambulatory Visit: Payer: BC Managed Care – PPO

## 2017-03-10 ENCOUNTER — Other Ambulatory Visit: Payer: Self-pay | Admitting: Radiology

## 2017-03-10 ENCOUNTER — Other Ambulatory Visit: Payer: Self-pay | Admitting: General Surgery

## 2017-03-11 ENCOUNTER — Other Ambulatory Visit (HOSPITAL_COMMUNITY)
Admission: RE | Admit: 2017-03-11 | Disposition: A | Discharge status: Home / Self Care | Payer: BC Managed Care – PPO | Source: Ambulatory Visit | Attending: Oncology | Admitting: Oncology

## 2017-03-11 ENCOUNTER — Ambulatory Visit
Admission: RE | Admit: 2017-03-11 | Discharge: 2017-03-11 | Disposition: A | Discharge status: Home / Self Care | Payer: BC Managed Care – PPO | Source: Ambulatory Visit | Attending: Oncology | Admitting: Oncology

## 2017-03-11 DIAGNOSIS — D649 Anemia, unspecified: Secondary | ICD-10-CM | POA: Insufficient documentation

## 2017-03-11 DIAGNOSIS — D709 Neutropenia, unspecified: Secondary | ICD-10-CM | POA: Diagnosis not present

## 2017-03-11 HISTORY — DX: Anemia, unspecified: D64.9

## 2017-03-11 HISTORY — DX: Hypothyroidism, unspecified: E03.9

## 2017-03-11 HISTORY — DX: Essential (primary) hypertension: I10

## 2017-03-11 LAB — CBC WITH DIFFERENTIAL/PLATELET
Basophils Absolute: 0 10*3/uL (ref 0–0.1)
Basophils Relative: 1 %
Eosinophils Absolute: 0.6 10*3/uL (ref 0–0.7)
Eosinophils Relative: 17 %
HCT: 35.2 % (ref 35.0–47.0)
Hemoglobin: 11.4 g/dL — ABNORMAL LOW (ref 12.0–16.0)
Lymphocytes Relative: 46 %
Lymphs Abs: 1.7 10*3/uL (ref 1.0–3.6)
MCH: 27 pg (ref 26.0–34.0)
MCHC: 32.5 g/dL (ref 32.0–36.0)
MCV: 83.1 fL (ref 80.0–100.0)
Monocytes Absolute: 0.2 10*3/uL (ref 0.2–0.9)
Monocytes Relative: 5 %
Neutro Abs: 1.1 10*3/uL — ABNORMAL LOW (ref 1.4–6.5)
Neutrophils Relative %: 31 %
Platelets: 222 10*3/uL (ref 150–440)
RBC: 4.24 MIL/uL (ref 3.80–5.20)
RDW: 14.1 % (ref 11.5–14.5)
WBC: 3.7 10*3/uL (ref 3.6–11.0)

## 2017-03-11 LAB — PROTIME-INR
INR: 1
Prothrombin Time: 13.2 seconds (ref 11.4–15.2)

## 2017-03-11 LAB — APTT: aPTT: 32 seconds (ref 24–36)

## 2017-03-11 MED ORDER — FENTANYL CITRATE (PF) 100 MCG/2ML IJ SOLN
INTRAMUSCULAR | Status: AC
Start: 2017-03-11 — End: 2017-03-11
  Filled 2017-03-11: qty 4

## 2017-03-11 MED ORDER — HEPARIN SOD (PORK) LOCK FLUSH 100 UNIT/ML IV SOLN
500.0000 [IU] | Freq: Once | INTRAVENOUS | Status: DC
Start: 2017-03-11 — End: 2017-03-12
  Filled 2017-03-11: qty 5

## 2017-03-11 MED ORDER — FENTANYL CITRATE (PF) 100 MCG/2ML IJ SOLN
INTRAMUSCULAR | Status: AC | PRN
  Administered 2017-03-11: 50 ug via INTRAVENOUS
  Administered 2017-03-11: 25 ug via INTRAVENOUS

## 2017-03-11 MED ORDER — SODIUM CHLORIDE 0.9 % IV SOLN
INTRAVENOUS | Status: DC
  Administered 2017-03-11: 10:00:00 via INTRAVENOUS

## 2017-03-11 MED ORDER — IOPAMIDOL (ISOVUE-300) INJECTION 61%: 75.0000 mL | Freq: Once | INTRAVENOUS | Status: DC | PRN

## 2017-03-11 MED ORDER — MIDAZOLAM HCL 5 MG/5ML IJ SOLN
INTRAMUSCULAR | Status: AC
  Filled 2017-03-11: qty 5

## 2017-03-11 MED ORDER — MIDAZOLAM HCL 5 MG/5ML IJ SOLN
INTRAMUSCULAR | Status: AC | PRN
  Administered 2017-03-11 (×2): 1 mg via INTRAVENOUS

## 2017-03-11 NOTE — Procedures (Signed)
CT-guided  R iliac bone marrow aspiration and core biopsy No complication No blood loss. See complete dictation in [facility] PACS  

## 2017-03-15 ENCOUNTER — Ambulatory Visit: Payer: BC Managed Care – PPO

## 2017-03-22 ENCOUNTER — Inpatient Hospital Stay: Payer: BC Managed Care – PPO

## 2017-03-22 ENCOUNTER — Inpatient Hospital Stay: Payer: BC Managed Care – PPO | Attending: Oncology | Admitting: Oncology

## 2017-03-22 ENCOUNTER — Encounter: Payer: Self-pay | Admitting: Oncology

## 2017-03-22 VITALS — BP 112/72 | HR 77 | Temp 97.2°F | Resp 18 | Ht 63.0 in | Wt 117.9 lb

## 2017-03-22 DIAGNOSIS — R221 Localized swelling, mass and lump, neck: Secondary | ICD-10-CM | POA: Diagnosis not present

## 2017-03-22 DIAGNOSIS — E538 Deficiency of other specified B group vitamins: Secondary | ICD-10-CM | POA: Diagnosis not present

## 2017-03-22 DIAGNOSIS — J45909 Unspecified asthma, uncomplicated: Secondary | ICD-10-CM | POA: Insufficient documentation

## 2017-03-22 DIAGNOSIS — D649 Anemia, unspecified: Secondary | ICD-10-CM | POA: Insufficient documentation

## 2017-03-22 DIAGNOSIS — E039 Hypothyroidism, unspecified: Secondary | ICD-10-CM | POA: Diagnosis not present

## 2017-03-22 DIAGNOSIS — Z87891 Personal history of nicotine dependence: Secondary | ICD-10-CM | POA: Insufficient documentation

## 2017-03-22 DIAGNOSIS — G4700 Insomnia, unspecified: Secondary | ICD-10-CM | POA: Diagnosis not present

## 2017-03-22 DIAGNOSIS — I10 Essential (primary) hypertension: Secondary | ICD-10-CM | POA: Insufficient documentation

## 2017-03-22 DIAGNOSIS — E785 Hyperlipidemia, unspecified: Secondary | ICD-10-CM | POA: Insufficient documentation

## 2017-03-22 DIAGNOSIS — Z79899 Other long term (current) drug therapy: Secondary | ICD-10-CM | POA: Insufficient documentation

## 2017-03-22 DIAGNOSIS — D709 Neutropenia, unspecified: Secondary | ICD-10-CM | POA: Diagnosis not present

## 2017-03-22 DIAGNOSIS — D519 Vitamin B12 deficiency anemia, unspecified: Secondary | ICD-10-CM

## 2017-03-22 MED ORDER — CYANOCOBALAMIN 1000 MCG/ML IJ SOLN
1000.0000 ug | Freq: Once | INTRAMUSCULAR | Status: AC
  Administered 2017-03-22: 1000 ug via INTRAMUSCULAR
  Filled 2017-03-22: qty 1

## 2017-03-22 NOTE — Progress Notes (Signed)
Sinus issues for a couple of weeks. Tried flonase, inhalers, mucinex. No fevers.

## 2017-03-23 ENCOUNTER — Ambulatory Visit: Payer: BC Managed Care – PPO

## 2017-03-23 LAB — CHROMOSOME ANALYSIS, BONE MARROW

## 2017-03-24 NOTE — Progress Notes (Signed)
Hematology/Oncology Consult note [facility]  Telephone:([phone] [phone]  Patient Care Team: Steele Sizer, MD as PCP - General (Family Medicine)   Name of the patient: Miranda Stafford  [id number]  03-07-1956   Date of visit: 03/24/17  Diagnosis- 1. Transient neutropenia likely reactive  2. Anemia likely due to B12 deficiency  Chief complaint/ Reason for visit- discuss results of bone marrow biopsy  Heme/Onc history: patient is a 61 year old female who has been referred to [place] for evaluation and management of normocytic anemia. CBC on 01/20/2017 showed white count of 3.4, H&H of 10.4/32.6 with an MCV of 84.5 and a platelet count of 205. TSH was elevated at 6.0. Iron studies showed a normal ferritin of 161. TIBC was slightly low at 247 and serum iron was normal at 58. Patient was admitted to the hospital in February 2018 for influenza and dehydration. Patient does have baseline asthma. She also reports that she has had low white count in the past as well and has seen a hematologist for a year or so and then stopped  Results of bloodwork from 02/10/2017 were as follows: CBC showed white count of 3.1, H&H of 9.9/30 with an MCV of 83 and a platelet count of 198. Differential showed neutropenia with an ANC of 0.9. Review of peripheral smear showed normocytic anemia and neutropenia. Unremarkable morphology. Ferritin and iron studies were within normal limits. B12 levels were low at 206. Folate was normal. Reticulocyte count was 1.2% which was low for the degree of anemia. ANA was negative. Multiple myeloma panel showed no monoclonal protein. Haptoglobin was normal at 121.  Bone marrow biopsy on 03/11/2017 showed normocellular marrow with normal trilineage hematopoiesis. No dyspoiesis or blastic cells were seen. No monoclonal B-cell or T-cell population was identified on bone marrow flow cytometry.  Interval history- she is having runny nose and sinus  congestion since 2 weeks    Review of systems- Review of Systems  Constitutional: Negative for chills, fever, malaise/fatigue and weight loss.  HENT: Positive for congestion and sinus pain. Negative for ear discharge and nosebleeds.   Eyes: Negative for blurred vision.  Respiratory: Negative for cough, hemoptysis, sputum production, shortness of breath and wheezing.   Cardiovascular: Negative for chest pain, palpitations, orthopnea and claudication.  Gastrointestinal: Negative for abdominal pain, blood in stool, constipation, diarrhea, heartburn, melena, nausea and vomiting.  Genitourinary: Negative for dysuria, flank pain, frequency, hematuria and urgency.  Musculoskeletal: Negative for back pain, joint pain and myalgias.  Skin: Negative for rash.  Neurological: Negative for dizziness, tingling, focal weakness, seizures, weakness and headaches.  Endo/Heme/Allergies: Does not bruise/bleed easily.  Psychiatric/Behavioral: Negative for depression and suicidal ideas. The patient does not have insomnia.       No Known Allergies   Past Medical History:  Diagnosis Date  . Allergy   . Anemia   . Asthma   . Fibroid tumor   . Herpes zoster   . Hyperlipidemia   . Hypertension   . Hypothyroidism   . Insomnia   . Osteopenia   . Thyroid disease      Past Surgical History:  Procedure Laterality Date  . ABDOMINAL HYSTERECTOMY  2005   fibroid tumor  . ADENOIDECTOMY    . CESAREAN SECTION    . TUBAL LIGATION      Social History   Social History  . Marital status: Married    Spouse name: N/A  . Number of children: N/A  . Years of education: N/A  Occupational History  . Not on file.   Social History Main Topics  . Smoking status: Former Smoker    Packs/day: 0.50    Years: 5.00    Types: Cigarettes    Quit date: 09/18/1985  . Smokeless tobacco: Never Used  . Alcohol use 0.0 oz/week     Comment: wine/socially/rarely  . Drug use: No  . Sexual activity: Yes   Other  Topics Concern  . Not on file   Social History Narrative  . No narrative on file    Family History  Problem Relation Age of Onset  . Heart disease Mother   . Hypertension Mother   . Hyperlipidemia Mother   . SIDS Son   . Breast cancer Sister 57  . Hypothyroidism Sister   . Hypertension Sister      Current Outpatient Prescriptions:  .  albuterol (PROVENTIL HFA;VENTOLIN HFA) 108 (90 Base) MCG/ACT inhaler, Inhale 2 puffs into the lungs every 6 (six) hours as needed for wheezing or shortness of breath., Disp: 1 Inhaler, Rfl: 0 .  cetirizine (ZYRTEC) 10 MG tablet, Take 10 mg by mouth daily., Disp: , Rfl:  .  fluticasone furoate-vilanterol (BREO ELLIPTA) 100-25 MCG/INH AEPB, Inhale 1 puff into the lungs daily., Disp: 60 each, Rfl: 0 .  levothyroxine (SYNTHROID, LEVOTHROID) 50 MCG tablet, Take 0.5-1 tablets (25-50 mcg total) by mouth daily. Alternate 25 mcg and 50 mcg every other day and recheck labs in 6 weeks, Disp: 30 tablet, Rfl: 1  Physical exam:  Vitals:   03/22/17 1551  BP: 112/72  Pulse: 77  Resp: 18  Temp: 97.2 F (36.2 C)  TempSrc: Tympanic  Weight: 117 lb 14.4 oz (53.5 kg)  Height: '5\' 3"'$  (1.6 m)   Physical Exam  Constitutional: She is oriented to person, place, and time and well-developed, well-nourished, and in no distress.  HENT:  Head: Normocephalic and atraumatic.  Eyes: EOM are normal. Pupils are equal, round, and reactive to light.  Neck: Normal range of motion.  Cardiovascular: Normal rate, regular rhythm and normal heart sounds.   Pulmonary/Chest: Effort normal and breath sounds normal.  Abdominal: Soft. Bowel sounds are normal.  Neurological: She is alert and oriented to person, place, and time.  Skin: Skin is warm and dry.     CMP Latest Ref Rng & Units 02/10/2017  Glucose 65 - 99 mg/dL 82  BUN 6 - 20 mg/dL 10  Creatinine 0.44 - 1.00 mg/dL 0.84  Sodium 135 - 145 mmol/L 137  Potassium 3.5 - 5.1 mmol/L 3.6  Chloride 101 - 111 mmol/L 107  CO2 22 - 32  mmol/L 26  Calcium 8.9 - 10.3 mg/dL 9.3  Total Protein 6.5 - 8.1 g/dL 7.8  Total Bilirubin 0.3 - 1.2 mg/dL 0.6  Alkaline Phos 38 - 126 U/L 35(L)  AST 15 - 41 U/L 31  ALT 14 - 54 U/L 16   CBC Latest Ref Rng & Units 03/11/2017  WBC 3.6 - 11.0 K/uL 3.7  Hemoglobin 12.0 - 16.0 g/dL 11.4(L)  Hematocrit 35.0 - 47.0 % 35.2  Platelets 150 - 440 K/uL 222    No images are attached to the encounter.  Ct Bone Marrow Biopsy & Aspiration  Result Date: 03/11/2017 CLINICAL DATA:  Neutropenia and normocytic anemia. EXAM: CT GUIDED DEEP ILIAC BONE ASPIRATION AND CORE BIOPSY TECHNIQUE: The procedure, risks (including but not limited to bleeding, infection, organ damage ), benefits, and alternatives were explained to the patient. Questions regarding the procedure were encouraged and answered. The  patient understands and consents to the procedure. Patient was placed supine on the CT gantry and limited axial scans through the pelvis were obtained. Appropriate skin entry site was identified. Skin site was marked, prepped with chlorhexidine, draped in usual sterile fashion, and infiltrated locally with 1% lidocaine. Intravenous Fentanyl and Versed were administered as conscious sedation during continuous monitoring of the patient's level of consciousness and physiological / cardiorespiratory status by the radiology RN, with a total moderate sedation time of 16 minutes. Under CT fluoroscopic guidance an 11-gauge Cook trocar bone needle was advanced into the right iliac bone just lateral to the sacroiliac joint. Once needle tip position was confirmed, core and aspiration samples were obtained. The final sample was obtained using the guiding needle itself, which was then removed. Post procedure scans show no hematoma or fracture. Patient tolerated procedure well. COMPLICATIONS: COMPLICATIONS none IMPRESSION: 1. Technically successful CT guided right iliac bone core and aspiration biopsy. Electronically Signed   By: Lucrezia Europe M.D.   On: 03/11/2017 11:33     Assessment and plan- Patient is a 61 y.o. female referred to [place] for neutropenia and anemia  1. I discussed the results of the bone marrow biopsy with the patient which does not show any evidence of lymphoma or leukemia or a primary hematologic disorder. Her CBC from 03/11/2017 showed normal wbc of 3.7 with normal differential. I am inclined to monitor this conservatively at this point and see her back in 6 months  2. Anemia- likely due to underlying hypothyroidism and b12 deficiency. She will continue monthly B12. H/H improved to 11.4 from 9.5 prior  3. Neck swelling- reports neck swelling mainly in the morning. No palpable adenopathy. US neck ordered but pending    Visit Diagnosis 1. Neutropenia, unspecified type ([place])   2. Anemia due to vitamin B12 deficiency, unspecified B12 deficiency type      Dr. Randa Evens, MD, MPH [facility] Pager- [phone] 03/24/2017 11:06 AM

## 2017-03-28 ENCOUNTER — Other Ambulatory Visit: Payer: Self-pay | Admitting: Family Medicine

## 2017-03-28 NOTE — Telephone Encounter (Signed)
Patient requesting refill of Synthroid to [facility].

## 2017-03-29 ENCOUNTER — Encounter (HOSPITAL_COMMUNITY): Payer: Self-pay

## 2017-03-30 ENCOUNTER — Ambulatory Visit
Admission: RE | Admit: 2017-03-30 | Discharge: 2017-03-30 | Disposition: A | Discharge status: Home / Self Care | Payer: BC Managed Care – PPO | Source: Ambulatory Visit | Attending: Oncology | Admitting: Oncology

## 2017-03-30 DIAGNOSIS — E538 Deficiency of other specified B group vitamins: Secondary | ICD-10-CM | POA: Insufficient documentation

## 2017-03-30 DIAGNOSIS — D649 Anemia, unspecified: Secondary | ICD-10-CM | POA: Diagnosis not present

## 2017-03-30 DIAGNOSIS — D709 Neutropenia, unspecified: Secondary | ICD-10-CM | POA: Insufficient documentation

## 2017-03-30 DIAGNOSIS — R221 Localized swelling, mass and lump, neck: Secondary | ICD-10-CM

## 2017-04-07 ENCOUNTER — Ambulatory Visit: Payer: BC Managed Care – PPO | Admitting: Family Medicine

## 2017-04-08 ENCOUNTER — Ambulatory Visit (INDEPENDENT_AMBULATORY_CARE_PROVIDER_SITE_OTHER): Payer: BC Managed Care – PPO | Admitting: Family Medicine

## 2017-04-08 ENCOUNTER — Encounter: Payer: Self-pay | Admitting: Family Medicine

## 2017-04-08 VITALS — BP 117/68 | HR 78 | Temp 97.5°F | Resp 16 | Ht 63.0 in | Wt 114.1 lb

## 2017-04-08 DIAGNOSIS — J0140 Acute pansinusitis, unspecified: Secondary | ICD-10-CM | POA: Diagnosis not present

## 2017-04-08 MED ORDER — AZITHROMYCIN 250 MG PO TABS: ORAL_TABLET | ORAL | 0 refills | Status: DC

## 2017-04-08 NOTE — Progress Notes (Signed)
Name: Miranda Stafford   MRN: [id number]    DOB: 06-12-1956   Date:04/08/2017       Progress Note  Subjective  Chief Complaint  Chief Complaint  Patient presents with  . Acute Visit    possible sinus infection     Sinusitis  This is a new problem. The current episode started more than 1 month ago. The problem is unchanged. There has been no fever. The pain is moderate. Associated symptoms include congestion, coughing, headaches, neck pain and a sore throat. Past treatments include acetaminophen and oral decongestants.     Past Medical History:  Diagnosis Date  . Allergy   . Anemia   . Asthma   . Fibroid tumor   . Herpes zoster   . Hyperlipidemia   . Hypertension   . Hypothyroidism   . Insomnia   . Osteopenia   . Thyroid disease     Past Surgical History:  Procedure Laterality Date  . ABDOMINAL HYSTERECTOMY  2005   fibroid tumor  . ADENOIDECTOMY    . CESAREAN SECTION    . TUBAL LIGATION      Family History  Problem Relation Age of Onset  . Heart disease Mother   . Hypertension Mother   . Hyperlipidemia Mother   . SIDS Son   . Breast cancer Sister 71  . Hypothyroidism Sister   . Hypertension Sister     Social History   Social History  . Marital status: Married    Spouse name: N/A  . Number of children: N/A  . Years of education: N/A   Occupational History  . Not on file.   Social History Main Topics  . Smoking status: Former Smoker    Packs/day: 0.50    Years: 5.00    Types: Cigarettes    Quit date: 09/18/1985  . Smokeless tobacco: Never Used  . Alcohol use 0.0 oz/week     Comment: wine/socially/rarely  . Drug use: No  . Sexual activity: Yes   Other Topics Concern  . Not on file   Social History Narrative  . No narrative on file     Current Outpatient Prescriptions:  .  albuterol (PROVENTIL HFA;VENTOLIN HFA) 108 (90 Base) MCG/ACT inhaler, Inhale 2 puffs into the lungs every 6 (six) hours as needed for wheezing or shortness of breath., Disp:  1 Inhaler, Rfl: 0 .  cetirizine (ZYRTEC) 10 MG tablet, Take 10 mg by mouth daily., Disp: , Rfl:  .  fluticasone furoate-vilanterol (BREO ELLIPTA) 100-25 MCG/INH AEPB, Inhale 1 puff into the lungs daily., Disp: 60 each, Rfl: 0 .  levothyroxine (SYNTHROID, LEVOTHROID) 50 MCG tablet, Take 0.5-1 tablets (25-50 mcg total) by mouth daily. Alternate 25 mcg and 50 mcg every other day and recheck labs in 6 weeks, Disp: 30 tablet, Rfl: 1  No Known Allergies   Review of Systems  HENT: Positive for congestion and sore throat.   Respiratory: Positive for cough.   Musculoskeletal: Positive for neck pain.  Neurological: Positive for headaches.      Objective  Vitals:   04/08/17 1447  BP: 117/68  Pulse: 78  Resp: 16  Temp: 97.5 F (36.4 C)  TempSrc: Oral  SpO2: 99%  Weight: 114 lb 1.6 oz (51.8 kg)  Height: 5\' 3"  (1.6 m)    Physical Exam  Constitutional: She is oriented to person, place, and time and well-developed, well-nourished, and in no distress.  HENT:  Head: Normocephalic and atraumatic.  Left Ear: Tympanic membrane and ear canal normal.  No drainage or swelling.  Nose: Right sinus exhibits no maxillary sinus tenderness and no frontal sinus tenderness. Left sinus exhibits no maxillary sinus tenderness and no frontal sinus tenderness.  Mouth/Throat: No posterior oropharyngeal erythema.  Right ear impacted with cerumen L>R turbinate hypertrophy  Neck: Neck supple.  Cardiovascular: Normal rate, regular rhythm, S1 normal, S2 normal and normal heart sounds.   No murmur heard. Pulmonary/Chest: Effort normal and breath sounds normal. She has no wheezes. She has no rhonchi.  Lymphadenopathy:    She has cervical adenopathy.       Right cervical: No deep cervical adenopathy present.      Left cervical: Superficial cervical adenopathy present. No deep cervical adenopathy present.  Neurological: She is alert and oriented to person, place, and time.  Nursing note and vitals  reviewed.     Assessment & Plan  1. Acute pansinusitis, recurrence not specified Based on patient's history and exam, symptoms consistent w acute sinusitis, started on antibiotics and recommended that she use Flonase for relief of nasal sinus congestion.  - azithromycin (ZITHROMAX Z-PAK) 250 MG tablet; 2 tabs po day 1, then 1 tab po q day x 4 days  Dispense: 6 each; Refill: 0   Mrk Buzby [facility] 04/08/2017 2:51 PM

## 2017-04-13 NOTE — Progress Notes (Signed)
I have called pt and went over thyroid scan about the tiny but no specific lymph nodes. Asked her if it would be ok to see ENT. She has seen ENT off of [address] and would be fine to go back there. I am not familiar with this office and she is going to find me the number and call me back in a few minutes.

## 2017-04-13 NOTE — Progress Notes (Signed)
I called pt to let her know the results of u/s showed tiny lymph nodes and likely nonspecifi but would rec: ENT eval. Patient agreeable to this and has been to one before.  She called me back with the name and [facility] [phone]. I told pt that I will call tom. And get her appt.

## 2017-04-13 NOTE — Progress Notes (Signed)
Please let patient know findings of her US thyroid. Submandibular LN are tiny and likely non specific. Recommend ENT referral if patient agreeable

## 2017-04-14 ENCOUNTER — Telehealth: Payer: Self-pay

## 2017-04-14 NOTE — Telephone Encounter (Signed)
Called to [facility] @ [phone] spoke w Marita [place] to make a referral for pt. She said to fax over info -did so @ [phone]

## 2017-04-15 NOTE — Progress Notes (Signed)
Pt ent appt 6/18 9 am. ENT already called her and gave her earlier options but she wanted this one.

## 2017-04-22 ENCOUNTER — Inpatient Hospital Stay: Payer: BC Managed Care – PPO | Attending: Oncology

## 2017-04-22 ENCOUNTER — Ambulatory Visit: Payer: BC Managed Care – PPO

## 2017-04-22 DIAGNOSIS — D709 Neutropenia, unspecified: Secondary | ICD-10-CM | POA: Insufficient documentation

## 2017-04-22 DIAGNOSIS — R221 Localized swelling, mass and lump, neck: Secondary | ICD-10-CM | POA: Diagnosis not present

## 2017-04-22 DIAGNOSIS — J45909 Unspecified asthma, uncomplicated: Secondary | ICD-10-CM | POA: Diagnosis not present

## 2017-04-22 DIAGNOSIS — E039 Hypothyroidism, unspecified: Secondary | ICD-10-CM | POA: Insufficient documentation

## 2017-04-22 DIAGNOSIS — E538 Deficiency of other specified B group vitamins: Secondary | ICD-10-CM | POA: Diagnosis not present

## 2017-04-22 DIAGNOSIS — Z79899 Other long term (current) drug therapy: Secondary | ICD-10-CM | POA: Diagnosis not present

## 2017-04-22 DIAGNOSIS — Z87891 Personal history of nicotine dependence: Secondary | ICD-10-CM | POA: Diagnosis not present

## 2017-04-22 DIAGNOSIS — I10 Essential (primary) hypertension: Secondary | ICD-10-CM | POA: Insufficient documentation

## 2017-04-22 DIAGNOSIS — E785 Hyperlipidemia, unspecified: Secondary | ICD-10-CM | POA: Insufficient documentation

## 2017-04-22 DIAGNOSIS — G4700 Insomnia, unspecified: Secondary | ICD-10-CM | POA: Diagnosis not present

## 2017-04-22 DIAGNOSIS — D649 Anemia, unspecified: Secondary | ICD-10-CM | POA: Diagnosis not present

## 2017-04-22 MED ORDER — CYANOCOBALAMIN 1000 MCG/ML IJ SOLN
1000.0000 ug | Freq: Once | INTRAMUSCULAR | Status: AC
  Administered 2017-04-22: 1000 ug via INTRAMUSCULAR
  Filled 2017-04-22: qty 1

## 2017-05-20 ENCOUNTER — Ambulatory Visit: Payer: BC Managed Care – PPO

## 2017-05-23 ENCOUNTER — Inpatient Hospital Stay: Payer: BC Managed Care – PPO | Attending: Oncology

## 2017-05-23 DIAGNOSIS — R221 Localized swelling, mass and lump, neck: Secondary | ICD-10-CM | POA: Diagnosis not present

## 2017-05-23 DIAGNOSIS — E538 Deficiency of other specified B group vitamins: Secondary | ICD-10-CM | POA: Insufficient documentation

## 2017-05-23 DIAGNOSIS — I10 Essential (primary) hypertension: Secondary | ICD-10-CM | POA: Insufficient documentation

## 2017-05-23 DIAGNOSIS — G4700 Insomnia, unspecified: Secondary | ICD-10-CM | POA: Diagnosis not present

## 2017-05-23 DIAGNOSIS — E039 Hypothyroidism, unspecified: Secondary | ICD-10-CM | POA: Insufficient documentation

## 2017-05-23 DIAGNOSIS — D649 Anemia, unspecified: Secondary | ICD-10-CM | POA: Insufficient documentation

## 2017-05-23 DIAGNOSIS — Z87891 Personal history of nicotine dependence: Secondary | ICD-10-CM | POA: Diagnosis not present

## 2017-05-23 DIAGNOSIS — D709 Neutropenia, unspecified: Secondary | ICD-10-CM | POA: Diagnosis present

## 2017-05-23 DIAGNOSIS — E785 Hyperlipidemia, unspecified: Secondary | ICD-10-CM | POA: Insufficient documentation

## 2017-05-23 DIAGNOSIS — J45909 Unspecified asthma, uncomplicated: Secondary | ICD-10-CM | POA: Insufficient documentation

## 2017-05-23 DIAGNOSIS — Z79899 Other long term (current) drug therapy: Secondary | ICD-10-CM | POA: Insufficient documentation

## 2017-05-23 MED ORDER — CYANOCOBALAMIN 1000 MCG/ML IJ SOLN
1000.0000 ug | Freq: Once | INTRAMUSCULAR | Status: AC
  Administered 2017-05-23: 1000 ug via INTRAMUSCULAR

## 2017-05-26 ENCOUNTER — Other Ambulatory Visit: Payer: Self-pay | Admitting: Family Medicine

## 2017-05-27 ENCOUNTER — Other Ambulatory Visit: Payer: Self-pay | Admitting: Family Medicine

## 2017-05-27 DIAGNOSIS — E038 Other specified hypothyroidism: Secondary | ICD-10-CM

## 2017-05-27 NOTE — Telephone Encounter (Signed)
Patient requesting refill of Synthroid to [facility].

## 2017-06-01 LAB — TSH: TSH: 11.24 mIU/L — ABNORMAL HIGH

## 2017-06-06 ENCOUNTER — Other Ambulatory Visit: Payer: Self-pay

## 2017-06-06 DIAGNOSIS — R799 Abnormal finding of blood chemistry, unspecified: Secondary | ICD-10-CM

## 2017-06-06 DIAGNOSIS — E079 Disorder of thyroid, unspecified: Secondary | ICD-10-CM

## 2017-06-06 NOTE — Telephone Encounter (Signed)
Patient requesting refill of Levothyroxine to [facility]. Informed patient of her labs and states why her TSH was so elevated was due to her being without medication for 3 weeks. Then informed her she needs to be seen for her medication refill appointment since she has only been in for acute issues. Please send in thyroid medication to pharmacy and I will notify patient. Thanks.

## 2017-06-09 MED ORDER — LEVOTHYROXINE SODIUM 50 MCG PO TABS: 50.0000 ug | ORAL_TABLET | Freq: Every day | ORAL | 1 refills | Status: DC

## 2017-06-09 NOTE — Addendum Note (Signed)
Addended by: Inda Coke on: 06/09/2017 11:57 AM   Modules accepted: Orders

## 2017-06-10 ENCOUNTER — Other Ambulatory Visit: Payer: Self-pay | Admitting: Family Medicine

## 2017-06-14 ENCOUNTER — Ambulatory Visit: Payer: BC Managed Care – PPO | Admitting: Family Medicine

## 2017-06-17 ENCOUNTER — Ambulatory Visit: Payer: BC Managed Care – PPO

## 2017-06-22 ENCOUNTER — Inpatient Hospital Stay: Payer: BC Managed Care – PPO | Attending: Oncology

## 2017-06-22 DIAGNOSIS — D709 Neutropenia, unspecified: Secondary | ICD-10-CM | POA: Insufficient documentation

## 2017-06-22 DIAGNOSIS — E785 Hyperlipidemia, unspecified: Secondary | ICD-10-CM | POA: Insufficient documentation

## 2017-06-22 DIAGNOSIS — G4700 Insomnia, unspecified: Secondary | ICD-10-CM | POA: Insufficient documentation

## 2017-06-22 DIAGNOSIS — E538 Deficiency of other specified B group vitamins: Secondary | ICD-10-CM | POA: Insufficient documentation

## 2017-06-22 DIAGNOSIS — Z79899 Other long term (current) drug therapy: Secondary | ICD-10-CM | POA: Insufficient documentation

## 2017-06-22 DIAGNOSIS — R221 Localized swelling, mass and lump, neck: Secondary | ICD-10-CM | POA: Insufficient documentation

## 2017-06-22 DIAGNOSIS — I10 Essential (primary) hypertension: Secondary | ICD-10-CM | POA: Insufficient documentation

## 2017-06-22 DIAGNOSIS — D649 Anemia, unspecified: Secondary | ICD-10-CM | POA: Insufficient documentation

## 2017-06-22 DIAGNOSIS — Z87891 Personal history of nicotine dependence: Secondary | ICD-10-CM | POA: Insufficient documentation

## 2017-06-22 DIAGNOSIS — J45909 Unspecified asthma, uncomplicated: Secondary | ICD-10-CM | POA: Insufficient documentation

## 2017-06-22 DIAGNOSIS — E039 Hypothyroidism, unspecified: Secondary | ICD-10-CM | POA: Insufficient documentation

## 2017-07-04 ENCOUNTER — Inpatient Hospital Stay: Payer: BC Managed Care – PPO

## 2017-07-04 DIAGNOSIS — R221 Localized swelling, mass and lump, neck: Secondary | ICD-10-CM | POA: Diagnosis not present

## 2017-07-04 DIAGNOSIS — E538 Deficiency of other specified B group vitamins: Secondary | ICD-10-CM

## 2017-07-04 DIAGNOSIS — D709 Neutropenia, unspecified: Secondary | ICD-10-CM | POA: Diagnosis present

## 2017-07-04 DIAGNOSIS — E785 Hyperlipidemia, unspecified: Secondary | ICD-10-CM | POA: Diagnosis not present

## 2017-07-04 DIAGNOSIS — E039 Hypothyroidism, unspecified: Secondary | ICD-10-CM | POA: Diagnosis not present

## 2017-07-04 DIAGNOSIS — Z87891 Personal history of nicotine dependence: Secondary | ICD-10-CM | POA: Diagnosis not present

## 2017-07-04 DIAGNOSIS — I10 Essential (primary) hypertension: Secondary | ICD-10-CM | POA: Diagnosis not present

## 2017-07-04 DIAGNOSIS — D649 Anemia, unspecified: Secondary | ICD-10-CM | POA: Diagnosis not present

## 2017-07-04 DIAGNOSIS — G4700 Insomnia, unspecified: Secondary | ICD-10-CM | POA: Diagnosis not present

## 2017-07-04 DIAGNOSIS — J45909 Unspecified asthma, uncomplicated: Secondary | ICD-10-CM | POA: Diagnosis not present

## 2017-07-04 DIAGNOSIS — Z79899 Other long term (current) drug therapy: Secondary | ICD-10-CM | POA: Diagnosis not present

## 2017-07-04 MED ORDER — CYANOCOBALAMIN 1000 MCG/ML IJ SOLN
1000.0000 ug | Freq: Once | INTRAMUSCULAR | Status: AC
  Administered 2017-07-04: 1000 ug via INTRAMUSCULAR
  Filled 2017-07-04: qty 1

## 2017-07-05 ENCOUNTER — Ambulatory Visit: Payer: BC Managed Care – PPO | Admitting: Family Medicine

## 2017-07-15 ENCOUNTER — Ambulatory Visit: Payer: BC Managed Care – PPO

## 2017-07-25 ENCOUNTER — Ambulatory Visit: Payer: BC Managed Care – PPO | Admitting: Family Medicine

## 2017-07-25 ENCOUNTER — Ambulatory Visit: Payer: BC Managed Care – PPO

## 2017-07-26 ENCOUNTER — Inpatient Hospital Stay: Payer: BC Managed Care – PPO | Admitting: Oncology

## 2017-07-26 ENCOUNTER — Inpatient Hospital Stay: Payer: BC Managed Care – PPO

## 2017-07-27 ENCOUNTER — Telehealth: Payer: Self-pay | Admitting: Oncology

## 2017-07-27 NOTE — Telephone Encounter (Signed)
Called patient to reschedule 07/26/17 No Show appt. L/M on v/m for patient to call and rschd.

## 2017-07-28 ENCOUNTER — Telehealth: Payer: Self-pay | Admitting: Family Medicine

## 2017-07-28 LAB — TSH: TSH: 8.69 mIU/L — ABNORMAL HIGH (ref 0.40–4.50)

## 2017-07-28 NOTE — Telephone Encounter (Signed)
Pt has scheduled an appt for this month. She will be completely out of levothyroxine before then. Please send a refill to [facility].

## 2017-08-02 ENCOUNTER — Inpatient Hospital Stay: Payer: BC Managed Care – PPO

## 2017-08-02 ENCOUNTER — Inpatient Hospital Stay: Payer: BC Managed Care – PPO | Admitting: Oncology

## 2017-08-03 ENCOUNTER — Encounter: Payer: Self-pay | Admitting: Family Medicine

## 2017-08-03 ENCOUNTER — Ambulatory Visit (INDEPENDENT_AMBULATORY_CARE_PROVIDER_SITE_OTHER): Payer: BC Managed Care – PPO | Admitting: Family Medicine

## 2017-08-03 VITALS — BP 116/68 | HR 90 | Temp 97.5°F | Resp 14 | Wt 117.5 lb

## 2017-08-03 DIAGNOSIS — J4540 Moderate persistent asthma, uncomplicated: Secondary | ICD-10-CM

## 2017-08-03 DIAGNOSIS — E538 Deficiency of other specified B group vitamins: Secondary | ICD-10-CM | POA: Diagnosis not present

## 2017-08-03 DIAGNOSIS — J3089 Other allergic rhinitis: Secondary | ICD-10-CM | POA: Diagnosis not present

## 2017-08-03 DIAGNOSIS — D708 Other neutropenia: Secondary | ICD-10-CM

## 2017-08-03 DIAGNOSIS — J309 Allergic rhinitis, unspecified: Secondary | ICD-10-CM | POA: Insufficient documentation

## 2017-08-03 DIAGNOSIS — E039 Hypothyroidism, unspecified: Secondary | ICD-10-CM

## 2017-08-03 DIAGNOSIS — E785 Hyperlipidemia, unspecified: Secondary | ICD-10-CM | POA: Diagnosis not present

## 2017-08-03 MED ORDER — CYANOCOBALAMIN 1000 MCG/ML IJ SOLN: 1000.0000 ug | Freq: Once | INTRAMUSCULAR | Status: DC

## 2017-08-03 MED ORDER — LEVOTHYROXINE SODIUM 50 MCG PO TABS: 50.0000 ug | ORAL_TABLET | Freq: Every day | ORAL | 1 refills | Status: DC

## 2017-08-03 MED ORDER — FLUTICASONE FUROATE-VILANTEROL 100-25 MCG/INH IN AEPB: 1.0000 | INHALATION_SPRAY | Freq: Every day | RESPIRATORY_TRACT | 0 refills | Status: DC

## 2017-08-03 MED ORDER — ALBUTEROL SULFATE HFA 108 (90 BASE) MCG/ACT IN AERS: 2.0000 | INHALATION_SPRAY | Freq: Four times a day (QID) | RESPIRATORY_TRACT | 0 refills | Status: DC | PRN

## 2017-08-03 MED ORDER — CYANOCOBALAMIN 1000 MCG/ML IJ SOLN
1000.0000 ug | Freq: Once | INTRAMUSCULAR | Status: AC
  Administered 2017-08-03: 1000 ug via INTRAMUSCULAR

## 2017-08-03 MED ORDER — MONTELUKAST SODIUM 10 MG PO TABS: 10.0000 mg | ORAL_TABLET | Freq: Every day | ORAL | 3 refills | Status: DC

## 2017-08-03 NOTE — Progress Notes (Addendum)
Name: Miranda Stafford   MRN: [id number]    DOB: 06/02/1956   Date:08/03/2017       Progress Note  Subjective  Chief Complaint  Chief Complaint  Patient presents with  . Medication Refill    synthroid    HPI  Asthma Moderate: she states she has dry cough at night and occasional SOB when she goes up stairs. She denies wheezing, using Breo prn, explained that Breo expires after 60 days that inhaler is opened. We will try adding singulair.   Hypothyroidism: she has been taking medication on a regular basis, 50 mcg every other day, TSH is still elevated, we will adjust dose, she is still feeling tired all the time.   B12: seen by Dr. Janese Banks for anemia and neutropenia, found to have lymphadenopathy and was referral to ENT, she was given reassurance. Getting B12 monthly and has one more follow up with Dr. Janese Banks, she will ask to follow up with me after that because co-pay cost.   AR: doing better, no nasal congestion since she started nasal spray given by ENT, not sure of the name but she will call me back with the name   Patient Active Problem List   Diagnosis Date Noted  . Allergic rhinitis due to allergen 08/03/2017  . B12 deficiency 02/25/2017  . Family history of breast cancer in sister 05/24/2016  . Hypothyroidism 09/19/2015  . Asthma, moderate persistent, poorly-controlled 09/19/2015  . Perennial allergic rhinitis with seasonal variation 09/19/2015  . Major depression in remission ([place]) 09/19/2015    Past Surgical History:  Procedure Laterality Date  . ABDOMINAL HYSTERECTOMY  2005   fibroid tumor  . ADENOIDECTOMY    . CESAREAN SECTION    . TUBAL LIGATION      Family History  Problem Relation Age of Onset  . Heart disease Mother   . Hypertension Mother   . Hyperlipidemia Mother   . SIDS Son   . Breast cancer Sister 9  . Hypothyroidism Sister   . Hypertension Sister     Social History   Social History  . Marital status: Married    Spouse name: N/A  . Number of  children: N/A  . Years of education: N/A   Occupational History  . Not on file.   Social History Main Topics  . Smoking status: Former Smoker    Packs/day: 0.50    Years: 5.00    Types: Cigarettes    Quit date: 09/18/1985  . Smokeless tobacco: Never Used  . Alcohol use 0.0 oz/week     Comment: wine/socially/rarely  . Drug use: No  . Sexual activity: Yes   Other Topics Concern  . Not on file   Social History Narrative  . No narrative on file     Current Outpatient Prescriptions:  .  albuterol (PROVENTIL HFA;VENTOLIN HFA) 108 (90 Base) MCG/ACT inhaler, Inhale 2 puffs into the lungs every 6 (six) hours as needed for wheezing or shortness of breath., Disp: 1 Inhaler, Rfl: 0 .  fluticasone furoate-vilanterol (BREO ELLIPTA) 100-25 MCG/INH AEPB, Inhale 1 puff into the lungs daily., Disp: 60 each, Rfl: 0 .  cetirizine (ZYRTEC) 10 MG tablet, Take 10 mg by mouth daily., Disp: , Rfl:   No Known Allergies   ROS  Constitutional: Negative for fever or weight change.  Respiratory: Positive  for cough and shortness of breath.   Cardiovascular: Negative for chest pain or palpitations.  Gastrointestinal: Negative for abdominal pain, no bowel changes.  Musculoskeletal: Negative for gait problem  or joint swelling.  Skin: Negative for rash.  Neurological: Negative for dizziness or headache.  No other specific complaints in a complete review of systems (except as listed in HPI above).  Objective  Vitals:   08/03/17 1533  BP: 116/68  Pulse: 90  Resp: 14  Temp: (!) 97.5 F (36.4 C)  TempSrc: Oral  SpO2: 98%  Weight: 117 lb 8 oz (53.3 kg)    Body mass index is 20.81 kg/m.  Physical Exam  Constitutional: Patient appears well-developed and well-nourished.  No distress.  HEENT: head atraumatic, normocephalic, pupils equal and reactive to light, boggy turbinates,  neck supple, throat within normal limits. Submandibular gland non -tender, no cervical lymphadenopathy Cardiovascular:  Normal rate, regular rhythm and normal heart sounds.  No murmur heard. No BLE edema. Pulmonary/Chest: Effort normal and breath sounds normal. No respiratory distress. Abdominal: Soft.  There is no tenderness. Psychiatric: Patient has a normal mood and affect. behavior is normal. Judgment and thought content normal.  Recent Results (from the past 2160 hour(s))  TSH     Status: Abnormal   Collection Time: 06/01/17  8:37 AM  Result Value Ref Range   TSH 11.24 (H) mIU/L    Comment:   Reference Range   > or = 20 Years  0.40-4.50   Pregnancy Range First trimester  0.26-2.66 Second trimester 0.55-2.73 Third trimester  0.43-2.91     TSH     Status: Abnormal   Collection Time: 07/28/17  9:59 AM  Result Value Ref Range   TSH 8.69 (H) 0.40 - 4.50 mIU/L      PHQ2/9: Depression screen [facility] 2/9 08/03/2017 04/08/2017 12/03/2016 10/18/2016 05/24/2016  Decreased Interest 0 0 0 0 0  Down, Depressed, Hopeless 0 0 0 0 0  PHQ - 2 Score 0 0 0 0 0     Fall Risk: Fall Risk  08/03/2017 04/08/2017 12/03/2016 10/18/2016 05/24/2016  Falls in the past year? Yes No No No No  Number falls in past yr: 1 - - - -  Injury with Fall? No - - - -     Functional Status Survey: Is the patient deaf or have difficulty hearing?: No Does the patient have difficulty seeing, even when wearing glasses/contacts?: Yes Does the patient have difficulty concentrating, remembering, or making decisions?: No Does the patient have difficulty walking or climbing stairs?: No Does the patient have difficulty dressing or bathing?: No Does the patient have difficulty doing errands alone such as visiting a doctor's office or shopping?: No   Assessment & Plan  1. Hypothyroidism, adult  - levothyroxine (SYNTHROID, LEVOTHROID) 50 MCG tablet; Take 1 tablet (50 mcg total) by mouth daily. Take 50 , five days weekly and off two days a week, recheck in 6 weeks  Dispense: 30 tablet; Refill: 1 - TSH  2. Moderate persistent asthma without  complication   - fluticasone furoate-vilanterol (BREO ELLIPTA) 100-25 MCG/INH AEPB; Inhale 1 puff into the lungs daily.  Dispense: 60 each; Refill: 0 - Spirometry: Pre & Post Eval, not much of a change moderate obstruction   - albuterol (PROVENTIL HFA;VENTOLIN HFA) 108 (90 Base) MCG/ACT inhaler; Inhale 2 puffs into the lungs every 6 (six) hours as needed for wheezing or shortness of breath.  Dispense: 1 Inhaler; Refill: 0 - montelukast (SINGULAIR) 10 MG tablet; Take 1 tablet (10 mg total) by mouth at bedtime.  Dispense: 30 tablet; Refill: 3   3. Dyslipidemia   4. Other neutropenia ([place])  Seen by Dr. Janese Banks for anemia and  neutropenia, normal bone marrow biopsy, getting B12 and needs to get thyroid under control   5. Non-seasonal allergic rhinitis due to other allergic trigger  Using a nasal spray given by ENT and is doing well   6. B12 deficiency  - cyanocobalamin ((VITAMIN B-12)) injection 1,000 mcg; Inject 1 mL (1,000 mcg total) into the muscle once.

## 2017-08-05 ENCOUNTER — Inpatient Hospital Stay: Payer: BC Managed Care – PPO | Admitting: Oncology

## 2017-08-05 ENCOUNTER — Inpatient Hospital Stay: Payer: BC Managed Care – PPO

## 2017-08-12 ENCOUNTER — Ambulatory Visit: Payer: BC Managed Care – PPO

## 2017-08-17 ENCOUNTER — Telehealth: Payer: Self-pay

## 2017-08-17 NOTE — Telephone Encounter (Signed)
Erroneous Entry  

## 2017-08-17 NOTE — Telephone Encounter (Signed)
Please close chart. It will not allow me to do it. Thank you

## 2017-08-22 ENCOUNTER — Ambulatory Visit: Payer: BC Managed Care – PPO

## 2017-08-30 ENCOUNTER — Ambulatory Visit (INDEPENDENT_AMBULATORY_CARE_PROVIDER_SITE_OTHER): Payer: BC Managed Care – PPO

## 2017-08-30 ENCOUNTER — Other Ambulatory Visit: Payer: Self-pay

## 2017-08-30 DIAGNOSIS — E538 Deficiency of other specified B group vitamins: Secondary | ICD-10-CM | POA: Diagnosis not present

## 2017-08-30 DIAGNOSIS — J302 Other seasonal allergic rhinitis: Secondary | ICD-10-CM

## 2017-08-30 DIAGNOSIS — J3089 Other allergic rhinitis: Secondary | ICD-10-CM

## 2017-08-30 MED ORDER — CYANOCOBALAMIN 1000 MCG/ML IJ SOLN
1000.0000 ug | Freq: Once | INTRAMUSCULAR | Status: AC
  Administered 2017-08-30: 1000 ug via INTRAMUSCULAR

## 2017-08-30 NOTE — Telephone Encounter (Signed)
Refill request for general medication for Dymista. Patient states Dr. Ancil Boozer would take over prescription from specialist.   Last office visit: 08/03/2017  Next visit: 11/02/2017

## 2017-08-30 NOTE — Progress Notes (Signed)
Patient states during her last visit 08/03/2017, Dr Ancil Boozer agreed to send in [facility] for the patient. Please send it into her pharmacy.

## 2017-08-31 MED ORDER — AZELASTINE-FLUTICASONE 137-50 MCG/ACT NA SUSP: 2.0000 | Freq: Every day | NASAL | 2 refills | Status: DC

## 2017-09-02 ENCOUNTER — Ambulatory Visit: Payer: BC Managed Care – PPO

## 2017-09-09 ENCOUNTER — Ambulatory Visit: Payer: BC Managed Care – PPO

## 2017-09-22 ENCOUNTER — Ambulatory Visit: Payer: BC Managed Care – PPO

## 2017-10-07 ENCOUNTER — Ambulatory Visit: Payer: BC Managed Care – PPO

## 2017-10-11 ENCOUNTER — Other Ambulatory Visit: Payer: Self-pay | Admitting: Family Medicine

## 2017-10-11 DIAGNOSIS — E039 Hypothyroidism, unspecified: Secondary | ICD-10-CM

## 2017-10-11 NOTE — Telephone Encounter (Signed)
Refill request for thyroid medication: Levothyroxine 50 mg   Last Physical: None Indicated  Lab Results  Component Value Date   TSH 8.69 (H) 07/28/2017   Follow up visit: 11/02/2017  Patient was suppose to follow up and have her labs rechecked in 6 weeks. Labs have not been checked.  Please advise before refill.

## 2017-10-11 NOTE — Telephone Encounter (Signed)
Copied from [place] [id number]. Topic: General - Call Back - No Documentation >> Oct 11, 2017  1:04 PM Conception Chancy, NT wrote: Reason for CRM: pt is returning call for Tamre Cass.

## 2017-10-11 NOTE — Telephone Encounter (Signed)
Called and left a message asking did patient have her blood work done for her TSH draw? This is past due and needed before medication can be filled.

## 2017-10-11 NOTE — Telephone Encounter (Signed)
Called and left a message for patient to return my call to see if she had her blood work done for her Thyroid yet.

## 2017-10-17 ENCOUNTER — Ambulatory Visit (INDEPENDENT_AMBULATORY_CARE_PROVIDER_SITE_OTHER): Payer: BC Managed Care – PPO

## 2017-10-17 DIAGNOSIS — E538 Deficiency of other specified B group vitamins: Secondary | ICD-10-CM | POA: Diagnosis not present

## 2017-10-17 MED ORDER — CYANOCOBALAMIN 1000 MCG/ML IJ SOLN
1000.0000 ug | Freq: Once | INTRAMUSCULAR | Status: AC
  Administered 2017-10-17: 1000 ug via INTRAMUSCULAR

## 2017-10-18 LAB — TSH: TSH: 3.75 mIU/L (ref 0.40–4.50)

## 2017-10-24 ENCOUNTER — Ambulatory Visit: Payer: BC Managed Care – PPO

## 2017-10-28 ENCOUNTER — Other Ambulatory Visit: Payer: Self-pay | Admitting: Family Medicine

## 2017-10-28 DIAGNOSIS — E039 Hypothyroidism, unspecified: Secondary | ICD-10-CM

## 2017-10-29 NOTE — Telephone Encounter (Signed)
Refill request for thyroid medication: Synthroid 50 mcg  Last Physical: 08/03/2017  Lab Results  Component Value Date   TSH 3.75 10/17/2017    Follow up visit: None indicated

## 2017-11-02 ENCOUNTER — Ambulatory Visit: Payer: BC Managed Care – PPO | Admitting: Family Medicine

## 2017-11-04 ENCOUNTER — Ambulatory Visit: Payer: BC Managed Care – PPO

## 2017-11-09 ENCOUNTER — Ambulatory Visit (INDEPENDENT_AMBULATORY_CARE_PROVIDER_SITE_OTHER): Payer: BC Managed Care – PPO

## 2017-11-09 DIAGNOSIS — E538 Deficiency of other specified B group vitamins: Secondary | ICD-10-CM | POA: Diagnosis not present

## 2017-11-09 MED ORDER — CYANOCOBALAMIN 1000 MCG/ML IJ SOLN
1000.0000 ug | Freq: Once | INTRAMUSCULAR | Status: AC
  Administered 2017-11-09: 1000 ug via INTRAMUSCULAR

## 2017-11-22 ENCOUNTER — Ambulatory Visit: Payer: BC Managed Care – PPO

## 2017-12-02 ENCOUNTER — Ambulatory Visit: Payer: BC Managed Care – PPO

## 2017-12-11 ENCOUNTER — Other Ambulatory Visit: Payer: Self-pay | Admitting: Family Medicine

## 2017-12-11 DIAGNOSIS — J4540 Moderate persistent asthma, uncomplicated: Secondary | ICD-10-CM

## 2017-12-12 NOTE — Telephone Encounter (Signed)
Refill request for general medication. Breo to [facility].   Last office visit: 08/03/2017   No Follow-up on file.

## 2017-12-23 ENCOUNTER — Ambulatory Visit: Payer: BC Managed Care – PPO

## 2017-12-30 ENCOUNTER — Ambulatory Visit: Payer: BC Managed Care – PPO

## 2018-01-10 ENCOUNTER — Encounter: Payer: Self-pay | Admitting: Family Medicine

## 2018-01-10 ENCOUNTER — Ambulatory Visit: Payer: BC Managed Care – PPO | Admitting: Family Medicine

## 2018-01-10 VITALS — BP 100/50 | HR 79 | Resp 14 | Ht 63.0 in | Wt 123.9 lb

## 2018-01-10 DIAGNOSIS — M8580 Other specified disorders of bone density and structure, unspecified site: Secondary | ICD-10-CM

## 2018-01-10 DIAGNOSIS — Z23 Encounter for immunization: Secondary | ICD-10-CM | POA: Diagnosis not present

## 2018-01-10 DIAGNOSIS — J3089 Other allergic rhinitis: Secondary | ICD-10-CM

## 2018-01-10 DIAGNOSIS — N951 Menopausal and female climacteric states: Secondary | ICD-10-CM

## 2018-01-10 DIAGNOSIS — F325 Major depressive disorder, single episode, in full remission: Secondary | ICD-10-CM | POA: Diagnosis not present

## 2018-01-10 DIAGNOSIS — E785 Hyperlipidemia, unspecified: Secondary | ICD-10-CM

## 2018-01-10 DIAGNOSIS — D649 Anemia, unspecified: Secondary | ICD-10-CM

## 2018-01-10 DIAGNOSIS — Z114 Encounter for screening for human immunodeficiency virus [HIV]: Secondary | ICD-10-CM | POA: Diagnosis not present

## 2018-01-10 DIAGNOSIS — D708 Other neutropenia: Secondary | ICD-10-CM | POA: Insufficient documentation

## 2018-01-10 DIAGNOSIS — E538 Deficiency of other specified B group vitamins: Secondary | ICD-10-CM

## 2018-01-10 DIAGNOSIS — E039 Hypothyroidism, unspecified: Secondary | ICD-10-CM

## 2018-01-10 DIAGNOSIS — J4520 Mild intermittent asthma, uncomplicated: Secondary | ICD-10-CM

## 2018-01-10 DIAGNOSIS — Z131 Encounter for screening for diabetes mellitus: Secondary | ICD-10-CM | POA: Diagnosis not present

## 2018-01-10 DIAGNOSIS — Z79899 Other long term (current) drug therapy: Secondary | ICD-10-CM

## 2018-01-10 MED ORDER — CYANOCOBALAMIN 1000 MCG/ML IJ SOLN
1000.0000 ug | INTRAMUSCULAR | Status: DC
  Administered 2018-01-10: 1000 ug via INTRAMUSCULAR

## 2018-01-10 MED ORDER — FLUTICASONE FUROATE-VILANTEROL 100-25 MCG/INH IN AEPB: INHALATION_SPRAY | RESPIRATORY_TRACT | 2 refills | Status: DC

## 2018-01-10 MED ORDER — AZELASTINE-FLUTICASONE 137-50 MCG/ACT NA SUSP: 2.0000 | Freq: Every day | NASAL | 2 refills | Status: DC

## 2018-01-10 MED ORDER — LEVOCETIRIZINE DIHYDROCHLORIDE 5 MG PO TABS: 5.0000 mg | ORAL_TABLET | Freq: Every evening | ORAL | 5 refills | Status: DC

## 2018-01-10 MED ORDER — MONTELUKAST SODIUM 10 MG PO TABS: 10.0000 mg | ORAL_TABLET | Freq: Every day | ORAL | 5 refills | Status: DC

## 2018-01-10 NOTE — Progress Notes (Signed)
Name: Miranda Stafford   MRN: [id number]    DOB: 1956-02-22   Date:01/10/2018       Progress Note  Subjective  Chief Complaint  Chief Complaint  Patient presents with  . Medication Refill  . Depression    HPI  Asthma Mild : she had a dry cough at night and occasional SOB when she goes up stairs on her last visit, she was given Breo and symptoms resolved, but she ran out of medication about 10 days ago and symptoms still under control, has a nocturnal dry cough at most once a week, no wheezing, some SOB occasionally. She prefers to not take Breo daily and monitor   Hypothyroidism: she has been taking medication on a regular basis, 50 mcg every other day, last TSH was at goal, she states hair is brittle, no dry skin, denies change in bowel movements. Gained a little weight, but feels good overall.   B12: seen by Dr. Janese Banks for anemia and neutropenia, found to have lymphadenopathy and was referral to ENT, she was given reassurance. Getting B12 monthly and is now coming here for regular labs. She was given Dymista by Dr. Richardson Landry, she states over the past couple of months she has lost the sense of smell. Still feels congestion, and has post-nasal drainage at night. Denies facial pressure, fever or chills. She gets sleepy with Dymista and skips dose sometimes. We will try switching to Xyzal, use Dymista at dinner time and is anosmia persists she will go back to ENT  Menopausal Symptoms: she has noticed fatigue, night sweats and host flashes, she does not want to take medication for it.   Major Depression: resolved, in remission.    Patient Active Problem List   Diagnosis Date Noted  . Other neutropenia ([place]) 01/10/2018  . Allergic rhinitis due to allergen 08/03/2017  . B12 deficiency 02/25/2017  . Family history of breast cancer in sister 05/24/2016  . Hypothyroidism 09/19/2015  . Asthma, moderate persistent, poorly-controlled 09/19/2015  . Perennial allergic rhinitis with seasonal variation  09/19/2015  . Major depression in remission ([place]) 09/19/2015    Past Surgical History:  Procedure Laterality Date  . ABDOMINAL HYSTERECTOMY  2005   fibroid tumor  . ADENOIDECTOMY    . CESAREAN SECTION    . TUBAL LIGATION      Family History  Problem Relation Age of Onset  . Heart disease Mother   . Hypertension Mother   . Hyperlipidemia Mother   . SIDS Son   . Breast cancer Sister 44  . Hypothyroidism Sister   . Hypertension Sister     Social History   Socioeconomic History  . Marital status: Married    Spouse name: Not on file  . Number of children: Not on file  . Years of education: Not on file  . Highest education level: Not on file  Social Needs  . Financial resource strain: Not on file  . Food insecurity - worry: Not on file  . Food insecurity - inability: Not on file  . Transportation needs - medical: Not on file  . Transportation needs - non-medical: Not on file  Occupational History  . Not on file  Tobacco Use  . Smoking status: Former Smoker    Packs/day: 0.50    Years: 5.00    Pack years: 2.50    Types: Cigarettes    Last attempt to quit: 09/18/1985    Years since quitting: 32.3  . Smokeless tobacco: Never Used  Substance and Sexual Activity  .  Alcohol use: Yes    Alcohol/week: 0.0 oz    Comment: wine/socially/rarely  . Drug use: No  . Sexual activity: Yes  Other Topics Concern  . Not on file  Social History Narrative  . Not on file     Current Outpatient Medications:  .  albuterol (PROVENTIL HFA;VENTOLIN HFA) 108 (90 Base) MCG/ACT inhaler, Inhale 2 puffs into the lungs every 6 (six) hours as needed for wheezing or shortness of breath., Disp: 1 Inhaler, Rfl: 0 .  Azelastine-Fluticasone (DYMISTA) 137-50 MCG/ACT SUSP, Place 2 sprays into the nose daily., Disp: 23 g, Rfl: 2 .  montelukast (SINGULAIR) 10 MG tablet, Take 1 tablet (10 mg total) by mouth at bedtime., Disp: 30 tablet, Rfl: 5 .  SYNTHROID 50 MCG tablet, take 1 tablet by mouth once  daily TAKE 50, FIVE DAYS WEEKLY AND OFF TWO DAYS A WEEK, RECHECK IN 6 WEEKS, Disp: 30 tablet, Rfl: 1 .  fluticasone furoate-vilanterol (BREO ELLIPTA) 100-25 MCG/INH AEPB, take 1 inhalation by mouth once daily, Disp: 60 each, Rfl: 2 .  levocetirizine (XYZAL) 5 MG tablet, Take 1 tablet (5 mg total) by mouth every evening., Disp: 30 tablet, Rfl: 5  No Known Allergies   ROS  Constitutional: Negative for fever or weight change.  Respiratory: Negative for cough and shortness of breath.   Cardiovascular: Negative for chest pain or palpitations.  Gastrointestinal: Negative for abdominal pain, no bowel changes.  Musculoskeletal: Negative for gait problem or joint swelling.  Skin: Negative for rash.  Neurological: Negative for dizziness or headache.  No other specific complaints in a complete review of systems (except as listed in HPI above).  Objective  Vitals:   01/10/18 1448  BP: (!) 100/50  Pulse: 79  Resp: 14  SpO2: 99%  Weight: 123 lb 14.4 oz (56.2 kg)  Height: 5\' 3"  (1.6 m)    Body mass index is 21.95 kg/m.  Physical Exam  Constitutional: Patient appears well-developed and well-nourished. Obese  No distress.  HEENT: head atraumatic, normocephalic, pupils equal and reactive to light,  neck supple, throat within normal limits , normal thyroid Cardiovascular: Normal rate, regular rhythm and normal heart sounds.  No murmur heard. No BLE edema. Pulmonary/Chest: Effort normal and breath sounds normal. No respiratory distress. Abdominal: Soft.  There is no tenderness. Psychiatric: Patient has a normal mood and affect. behavior is normal. Judgment and thought content normal.  Recent Results (from the past 2160 hour(s))  TSH     Status: None   Collection Time: 10/17/17  3:02 PM  Result Value Ref Range   TSH 3.75 0.40 - 4.50 mIU/L     PHQ2/9: Depression screen [facility] 2/9 01/10/2018 08/03/2017 04/08/2017 12/03/2016 10/18/2016  Decreased Interest 0 0 0 0 0  Down, Depressed, Hopeless 0 0  0 0 0  PHQ - 2 Score 0 0 0 0 0  Altered sleeping 0 - - - -  Tired, decreased energy 0 - - - -  Change in appetite 0 - - - -  Feeling bad or failure about yourself  0 - - - -  Trouble concentrating 0 - - - -  Moving slowly or fidgety/restless 0 - - - -  Suicidal thoughts 0 - - - -  PHQ-9 Score 0 - - - -  Difficult doing work/chores Not difficult at all - - - -     Fall Risk: Fall Risk  01/10/2018 08/03/2017 04/08/2017 12/03/2016 10/18/2016  Falls in the past year? No Yes No No No  Number falls in past yr: - 1 - - -  Injury with Fall? - No - - -     Functional Status Survey: Is the patient deaf or have difficulty hearing?: No Does the patient have difficulty seeing, even when wearing glasses/contacts?: No Does the patient have difficulty concentrating, remembering, or making decisions?: No Does the patient have difficulty walking or climbing stairs?: No Does the patient have difficulty dressing or bathing?: No Does the patient have difficulty doing errands alone such as visiting a doctor's office or shopping?: No    Assessment & Plan  1. Hypothyroidism, adult  - TSH - DG Bone Density; Future  2. Flu vaccine need  refused  3. Encounter for screening for HIV  - HIV antibody  4. B12 deficiency  - B12 in the office today   5. Mild intermittent asthma without complication  - fluticasone furoate-vilanterol (BREO ELLIPTA) 100-25 MCG/INH AEPB; take 1 inhalation by mouth once daily  Dispense: 60 each; Refill: 2 - Azelastine-Fluticasone (DYMISTA) 137-50 MCG/ACT SUSP; Place 2 sprays into the nose daily.  Dispense: 23 g; Refill: 2  6. Non-seasonal allergic rhinitis due to other allergic trigger  - Azelastine-Fluticasone (DYMISTA) 137-50 MCG/ACT SUSP; Place 2 sprays into the nose daily.  Dispense: 23 g; Refill: 2 - montelukast (SINGULAIR) 10 MG tablet; Take 1 tablet (10 mg total) by mouth at bedtime.  Dispense: 30 tablet; Refill: 5 - levocetirizine (XYZAL) 5 MG tablet; Take 1  tablet (5 mg total) by mouth every evening.  Dispense: 30 tablet; Refill: 5  7. Dyslipidemia  - Lipid panel  8. Diabetes mellitus screening  - Hemoglobin A1c  9. Anemia, unspecified type  - CBC with Differential/Platelet  10. Menopausal symptoms  - DG Bone Density; Future  11. Other neutropenia (HCC)  - CBC with Differential/Platelet  12. Major depression in remission ([place])   13. Osteopenia, unspecified location  - VITAMIN D 25 Hydroxy (Vit-D Deficiency, Fractures)  14. Long-term use of high-risk medication  - Comprehensive metabolic panel

## 2018-01-11 LAB — CBC WITH DIFFERENTIAL/PLATELET
Basophils Absolute: 39 cells/uL (ref 0–200)
Basophils Relative: 0.9 %
Eosinophils Absolute: 430 cells/uL (ref 15–500)
Eosinophils Relative: 10 %
HCT: 32.9 % — ABNORMAL LOW (ref 35.0–45.0)
Hemoglobin: 10.7 g/dL — ABNORMAL LOW (ref 11.7–15.5)
Lymphs Abs: 2077 cells/uL (ref 850–3900)
MCH: 26.2 pg — ABNORMAL LOW (ref 27.0–33.0)
MCHC: 32.5 g/dL (ref 32.0–36.0)
MCV: 80.4 fL (ref 80.0–100.0)
MPV: 11.2 fL (ref 7.5–12.5)
Monocytes Relative: 4.9 %
Neutro Abs: 1544 cells/uL (ref 1500–7800)
Neutrophils Relative %: 35.9 %
Platelets: 233 10*3/uL (ref 140–400)
RBC: 4.09 10*6/uL (ref 3.80–5.10)
RDW: 13 % (ref 11.0–15.0)
Total Lymphocyte: 48.3 %
WBC mixed population: 211 cells/uL (ref 200–950)
WBC: 4.3 10*3/uL (ref 3.8–10.8)

## 2018-01-11 LAB — LIPID PANEL
Cholesterol: 273 mg/dL — ABNORMAL HIGH (ref <200)
HDL: 76 mg/dL (ref >50)
LDL Cholesterol (Calc): 165 mg/dL (calc) — ABNORMAL HIGH
Non-HDL Cholesterol (Calc): 197 mg/dL (calc) — ABNORMAL HIGH (ref <130)
Total CHOL/HDL Ratio: 3.6 (calc) (ref <5.0)
Triglycerides: 171 mg/dL — ABNORMAL HIGH (ref <150)

## 2018-01-11 LAB — COMPREHENSIVE METABOLIC PANEL
AG Ratio: 1.6 (calc) (ref 1.0–2.5)
ALT: 15 U/L (ref 6–29)
AST: 21 U/L (ref 10–35)
Albumin: 4.5 g/dL (ref 3.6–5.1)
Alkaline phosphatase (APISO): 47 U/L (ref 33–130)
BUN/Creatinine Ratio: 14 (calc) (ref 6–22)
BUN: 15 mg/dL (ref 7–25)
CO2: 28 mmol/L (ref 20–32)
Calcium: 9.3 mg/dL (ref 8.6–10.4)
Chloride: 105 mmol/L (ref 98–110)
Creat: 1.09 mg/dL — ABNORMAL HIGH (ref 0.50–1.05)
Globulin: 2.9 g/dL (calc) (ref 1.9–3.7)
Glucose, Bld: 79 mg/dL (ref 65–139)
Potassium: 3.8 mmol/L (ref 3.5–5.3)
Sodium: 141 mmol/L (ref 135–146)
Total Bilirubin: 0.3 mg/dL (ref 0.2–1.2)
Total Protein: 7.4 g/dL (ref 6.1–8.1)

## 2018-01-11 LAB — HEMOGLOBIN A1C
Hgb A1c MFr Bld: 5.8 % of total Hgb — ABNORMAL HIGH (ref <5.7)
Mean Plasma Glucose: 120 (calc)
eAG (mmol/L): 6.6 (calc)

## 2018-01-11 LAB — VITAMIN D 25 HYDROXY (VIT D DEFICIENCY, FRACTURES): Vit D, 25-Hydroxy: 10 ng/mL — ABNORMAL LOW (ref 30–100)

## 2018-01-11 LAB — TSH: TSH: 4.17 mIU/L (ref 0.40–4.50)

## 2018-01-11 LAB — HIV ANTIBODY (ROUTINE TESTING W REFLEX): HIV 1&2 Ab, 4th Generation: NONREACTIVE

## 2018-01-12 ENCOUNTER — Other Ambulatory Visit: Payer: Self-pay | Admitting: Family Medicine

## 2018-01-12 DIAGNOSIS — E039 Hypothyroidism, unspecified: Secondary | ICD-10-CM

## 2018-01-12 MED ORDER — ATORVASTATIN CALCIUM 40 MG PO TABS: 40.0000 mg | ORAL_TABLET | Freq: Every day | ORAL | 0 refills | Status: DC

## 2018-01-12 MED ORDER — LEVOTHYROXINE SODIUM 50 MCG PO TABS: ORAL_TABLET | ORAL | 2 refills | Status: DC

## 2018-01-12 MED ORDER — VITAMIN D (ERGOCALCIFEROL) 1.25 MG (50000 UNIT) PO CAPS: 50000.0000 [IU] | ORAL_CAPSULE | ORAL | 0 refills | Status: DC

## 2018-01-12 NOTE — Progress Notes (Signed)
aorvat

## 2018-01-20 ENCOUNTER — Ambulatory Visit: Payer: BC Managed Care – PPO

## 2018-01-20 IMAGING — CR DG CHEST 2V
1 series · 2 of 2 positions shown · non-contrast
Comparison: Chest radiograph and CTA of the chest performed
09/06/2010

CLINICAL DATA: Acute onset of fever, cough and chills. Body aches.
Initial encounter.

EXAM:
CHEST  2 VIEW

[Series 1: w chest lat · 0.14mm/px · 2 of 2 slices shown]
[im 1/2]
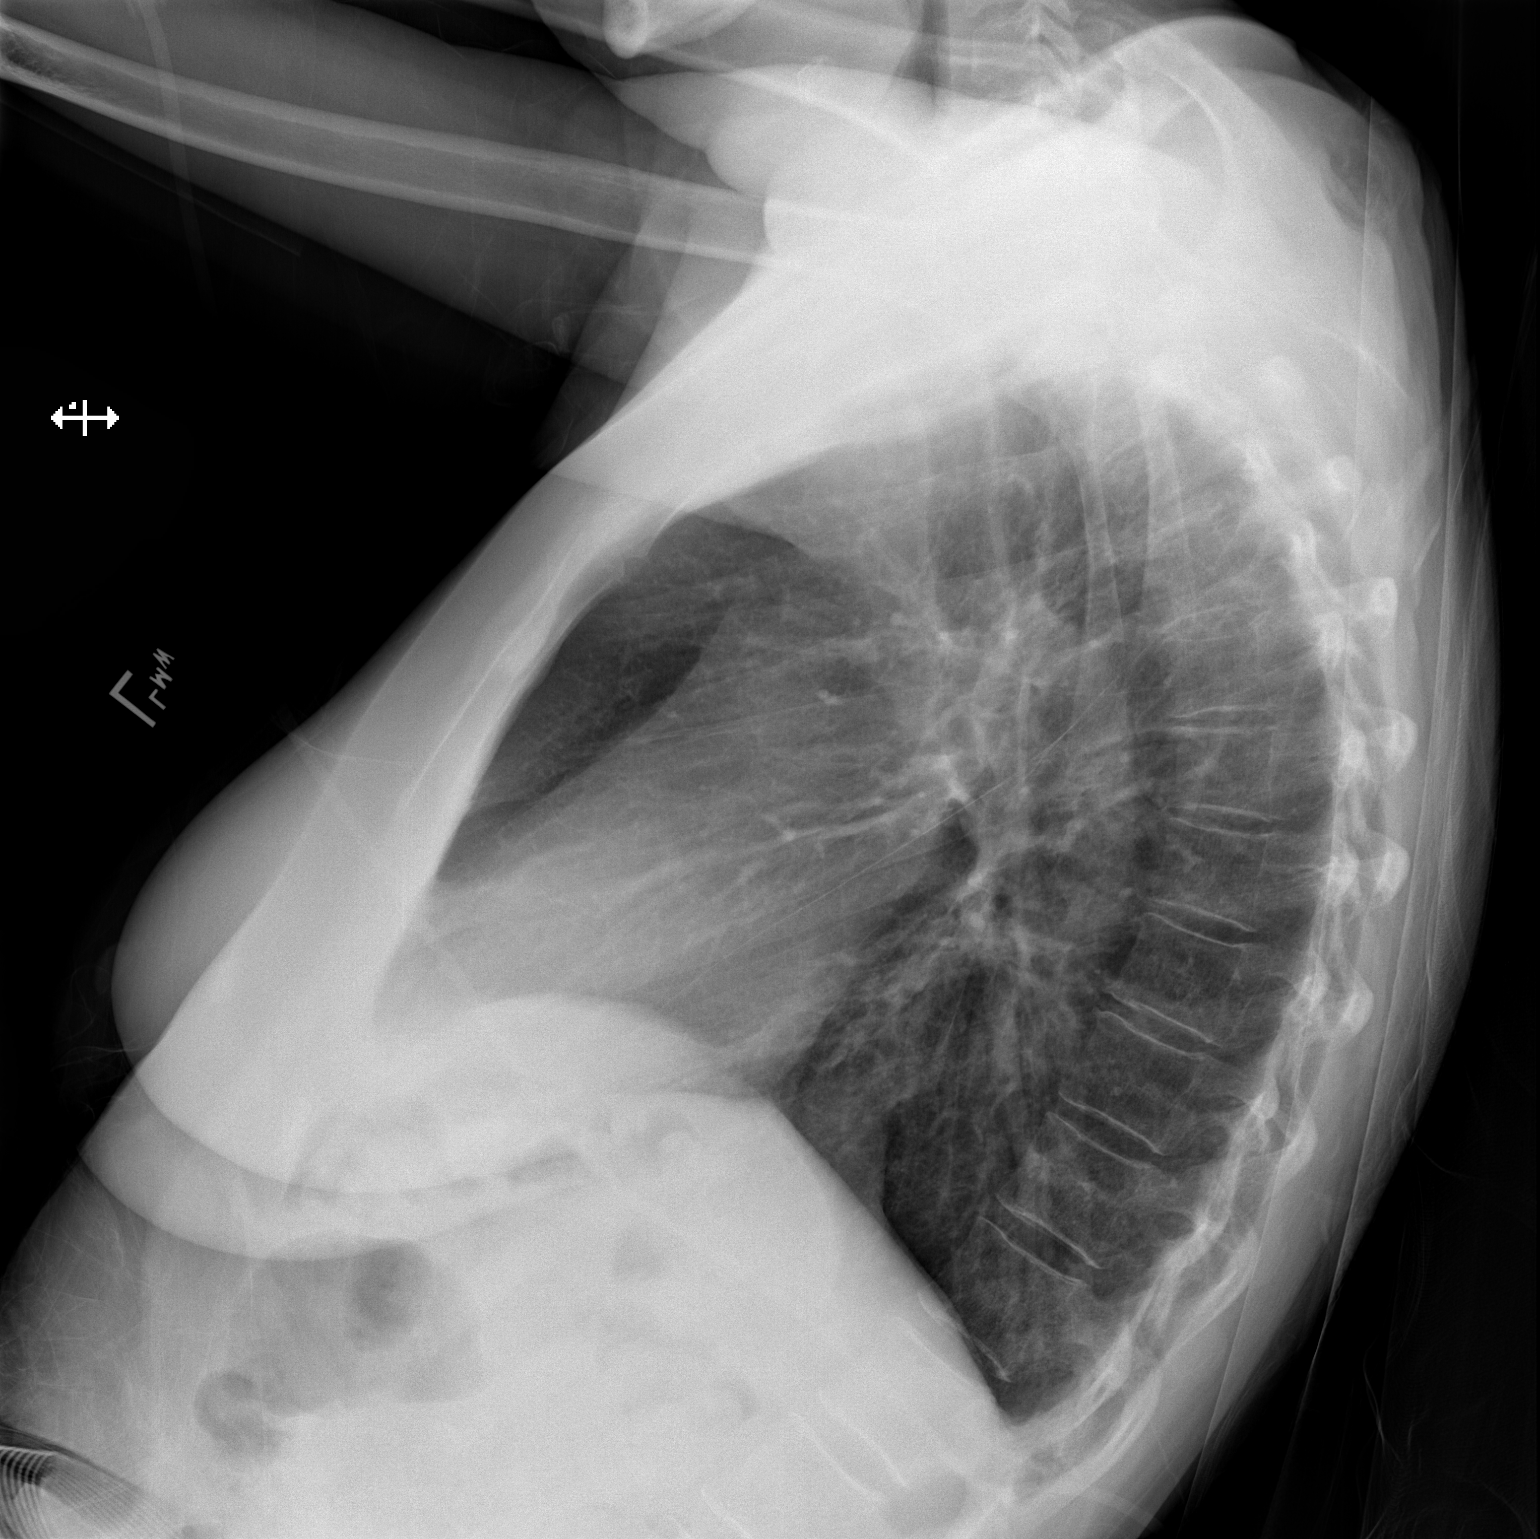
[im 2/2]
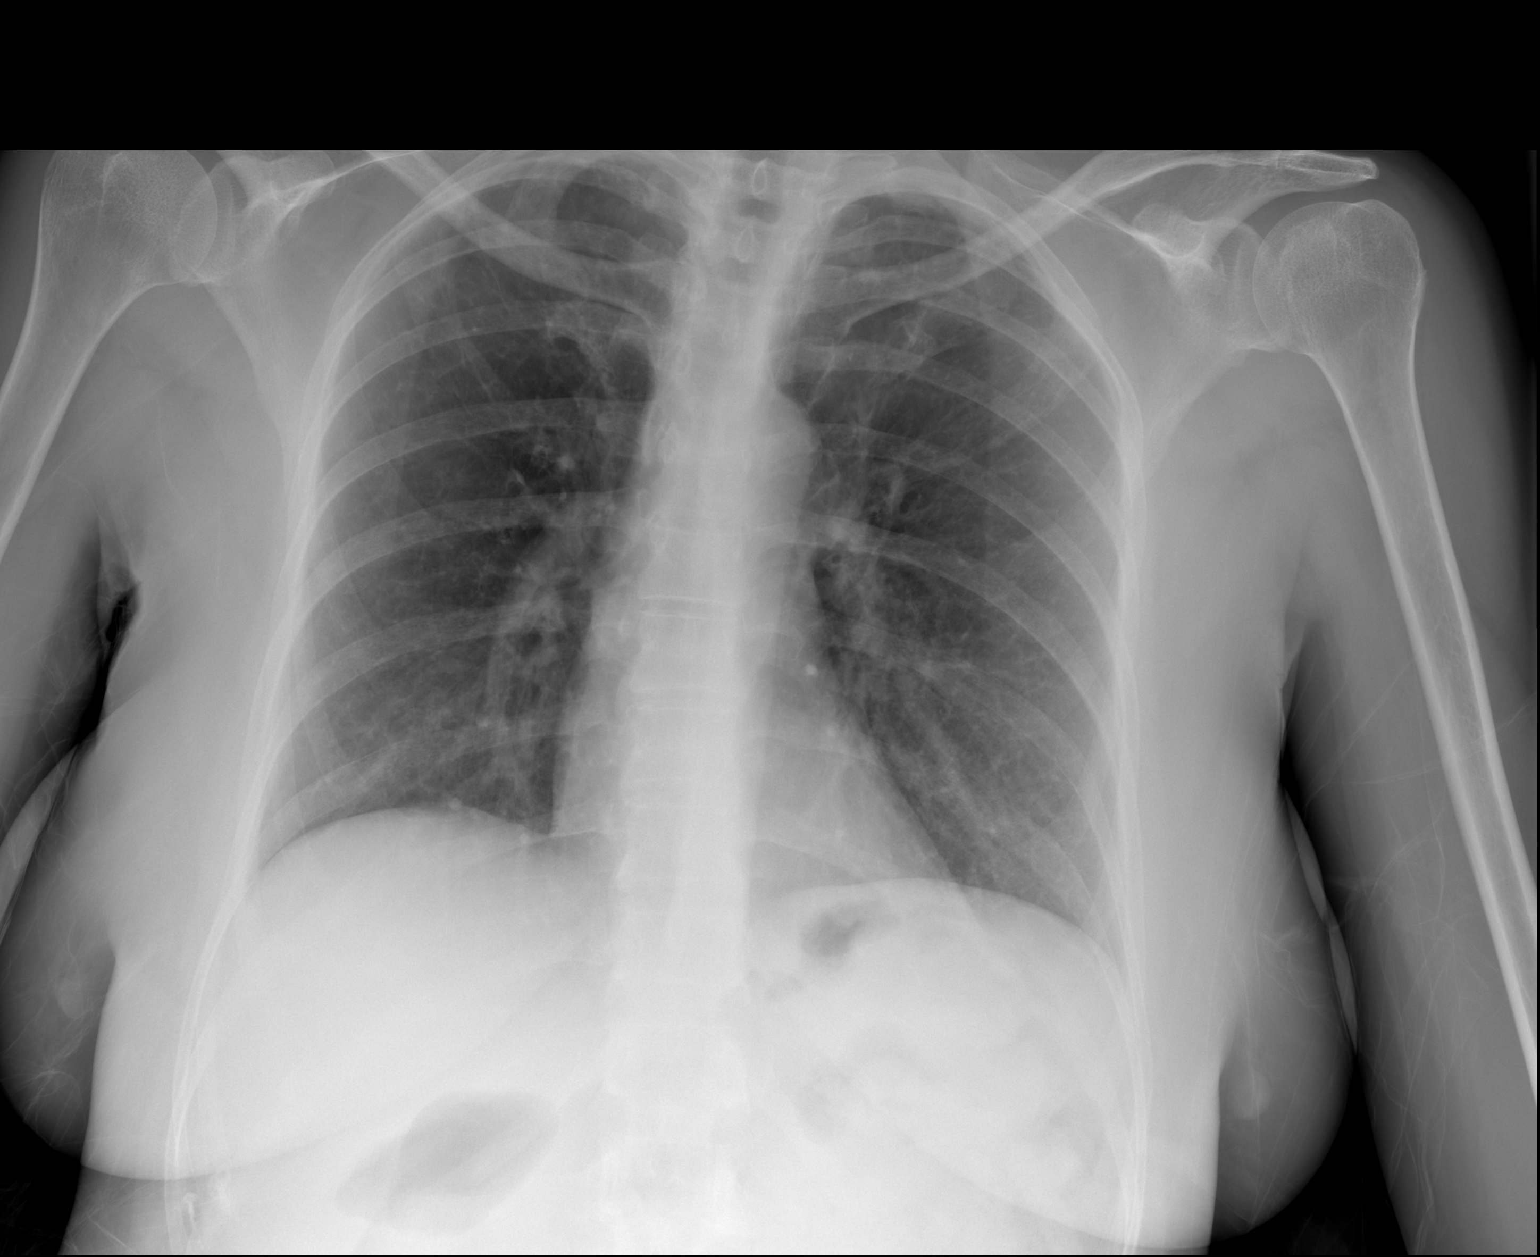

[2 of 2 positions shown; findings below may reference images not displayed]

FINDINGS: The lungs are well-aerated. Mild vascular congestion is noted. There
is no evidence of focal opacification, pleural effusion or
pneumothorax.

The heart is normal in size; the mediastinal contour is within
normal limits. No acute osseous abnormalities are seen.
IMPRESSION: Mild vascular congestion noted.  Lungs remain grossly clear.

## 2018-01-27 ENCOUNTER — Ambulatory Visit: Payer: BC Managed Care – PPO

## 2018-02-16 ENCOUNTER — Ambulatory Visit (INDEPENDENT_AMBULATORY_CARE_PROVIDER_SITE_OTHER): Payer: BC Managed Care – PPO

## 2018-02-16 DIAGNOSIS — E538 Deficiency of other specified B group vitamins: Secondary | ICD-10-CM

## 2018-02-16 MED ORDER — CYANOCOBALAMIN 1000 MCG/ML IJ SOLN
1000.0000 ug | Freq: Once | INTRAMUSCULAR | Status: AC
  Administered 2018-02-16: 1000 ug via INTRAMUSCULAR

## 2018-02-20 ENCOUNTER — Ambulatory Visit: Payer: BC Managed Care – PPO

## 2018-02-24 ENCOUNTER — Ambulatory Visit: Payer: BC Managed Care – PPO

## 2018-03-20 ENCOUNTER — Other Ambulatory Visit: Payer: Self-pay | Admitting: Family Medicine

## 2018-03-20 DIAGNOSIS — Z1231 Encounter for screening mammogram for malignant neoplasm of breast: Secondary | ICD-10-CM

## 2018-03-22 ENCOUNTER — Ambulatory Visit: Payer: BC Managed Care – PPO

## 2018-04-06 ENCOUNTER — Ambulatory Visit
Admission: RE | Admit: 2018-04-06 | Discharge: 2018-04-06 | Disposition: A | Discharge status: Home / Self Care | Payer: BC Managed Care – PPO | Source: Ambulatory Visit | Attending: Family Medicine | Admitting: Family Medicine

## 2018-04-06 DIAGNOSIS — Z1231 Encounter for screening mammogram for malignant neoplasm of breast: Secondary | ICD-10-CM | POA: Diagnosis present

## 2018-05-04 ENCOUNTER — Ambulatory Visit (INDEPENDENT_AMBULATORY_CARE_PROVIDER_SITE_OTHER): Payer: BC Managed Care – PPO

## 2018-05-04 DIAGNOSIS — E538 Deficiency of other specified B group vitamins: Secondary | ICD-10-CM | POA: Diagnosis not present

## 2018-05-04 MED ORDER — CYANOCOBALAMIN 1000 MCG/ML IJ SOLN
1000.0000 ug | Freq: Once | INTRAMUSCULAR | Status: AC
  Administered 2018-05-04: 1000 ug via INTRAMUSCULAR

## 2018-05-26 ENCOUNTER — Encounter: Payer: Self-pay | Admitting: Nurse Practitioner

## 2018-05-26 ENCOUNTER — Ambulatory Visit: Payer: BC Managed Care – PPO | Admitting: Nurse Practitioner

## 2018-05-26 VITALS — BP 100/70 | HR 60 | Temp 97.8°F | Resp 16 | Ht 63.0 in | Wt 122.3 lb

## 2018-05-26 DIAGNOSIS — J4540 Moderate persistent asthma, uncomplicated: Secondary | ICD-10-CM

## 2018-05-26 DIAGNOSIS — J339 Nasal polyp, unspecified: Secondary | ICD-10-CM | POA: Diagnosis not present

## 2018-05-26 DIAGNOSIS — H6122 Impacted cerumen, left ear: Secondary | ICD-10-CM | POA: Diagnosis not present

## 2018-05-26 MED ORDER — ALBUTEROL SULFATE HFA 108 (90 BASE) MCG/ACT IN AERS: 2.0000 | INHALATION_SPRAY | Freq: Four times a day (QID) | RESPIRATORY_TRACT | 0 refills | Status: DC | PRN

## 2018-05-26 MED ORDER — MOMETASONE FUROATE 50 MCG/ACT NA SUSP: 2.0000 | Freq: Two times a day (BID) | NASAL | 12 refills | Status: DC

## 2018-05-26 NOTE — Progress Notes (Signed)
Name: Miranda Stafford   MRN: [id number]    DOB: 10/08/1956   Date:05/26/2018       Progress Note  Subjective  Chief Complaint  Chief Complaint  Patient presents with  . Sinusitis    She complains of congestion, headache, ear pain, dry cough and feeling off balance x 1 week. She has been treating symptoms with Nasal Spray, Singulair and Mucinex.  . Asthma    She had an asthma attack on Wednesday night. She used her inhalers for treatment. She takes Breo at night.    HPI  sinusitis states always has sinus congestion but feels like her ears are a tunnel- states is mainly right side- lots of mucus from right nares and has right sided headache- has cough and felt off balance.  Wednesday- had asthma attack- states went home early due to sinus problems states was laying down and was having hard time breathing; panicked when she couldn't find inhaler and got even more short of breath- took 2 puffs of the albuterol and felt better. No episodes since then; hasnt needed albuterol inhaler since then.   Patient Active Problem List   Diagnosis Date Noted  . Other neutropenia ([place]) 01/10/2018  . Allergic rhinitis due to allergen 08/03/2017  . B12 deficiency 02/25/2017  . Family history of breast cancer in sister 05/24/2016  . Hypothyroidism 09/19/2015  . Asthma, moderate persistent, poorly-controlled 09/19/2015  . Perennial allergic rhinitis with seasonal variation 09/19/2015  . Major depression in remission ([place]) 09/19/2015    Past Medical History:  Diagnosis Date  . Allergy   . Anemia   . Asthma   . Fibroid tumor   . Herpes zoster   . Hyperlipidemia   . Hypertension   . Hypothyroidism   . Insomnia   . Osteopenia   . Thyroid disease     Past Surgical History:  Procedure Laterality Date  . ABDOMINAL HYSTERECTOMY  2005   fibroid tumor  . ADENOIDECTOMY    . CESAREAN SECTION    . TUBAL LIGATION      Social History   Tobacco Use  . Smoking status: Former Smoker    Packs/day:  0.50    Years: 5.00    Pack years: 2.50    Types: Cigarettes    Last attempt to quit: 09/18/1985    Years since quitting: 32.7  . Smokeless tobacco: Never Used  Substance Use Topics  . Alcohol use: Yes    Alcohol/week: 0.0 oz    Comment: wine/socially/rarely     Current Outpatient Medications:  .  albuterol (PROVENTIL HFA;VENTOLIN HFA) 108 (90 Base) MCG/ACT inhaler, Inhale 2 puffs into the lungs every 6 (six) hours as needed for wheezing or shortness of breath., Disp: 1 Inhaler, Rfl: 0 .  atorvastatin (LIPITOR) 40 MG tablet, Take 1 tablet (40 mg total) by mouth daily., Disp: 90 tablet, Rfl: 0 .  Azelastine-Fluticasone (DYMISTA) 137-50 MCG/ACT SUSP, Place 2 sprays into the nose daily., Disp: 23 g, Rfl: 2 .  fluticasone furoate-vilanterol (BREO ELLIPTA) 100-25 MCG/INH AEPB, take 1 inhalation by mouth once daily, Disp: 60 each, Rfl: 2 .  levocetirizine (XYZAL) 5 MG tablet, Take 1 tablet (5 mg total) by mouth every evening., Disp: 30 tablet, Rfl: 5 .  levothyroxine (SYNTHROID) 50 MCG tablet, take 1 tablet by mouth once daily TAKE 50, FIVE DAYS WEEKLY AND OFF  WEEKS TWO DAYS A WEEK, Disp: 35 tablet, Rfl: 2 .  montelukast (SINGULAIR) 10 MG tablet, Take 1 tablet (10 mg total) by mouth  at bedtime., Disp: 30 tablet, Rfl: 5 .  Vitamin D, Ergocalciferol, (DRISDOL) 50000 units CAPS capsule, Take 1 capsule (50,000 Units total) by mouth every 7 (seven) days., Disp: 12 capsule, Rfl: 0  No Known Allergies  ROS   No other specific complaints in a complete review of systems (except as listed in HPI above).  Objective  Vitals:   05/26/18 1025  BP: 100/70  Pulse: 60  Resp: 16  Temp: 97.8 F (36.6 C)  TempSrc: Oral  SpO2: 95%  Weight: 122 lb 4.8 oz (55.5 kg)  Height: 5\' 3"  (1.6 m)     Body mass index is 21.66 kg/m.  Nursing Note and Vital Signs reviewed.  Physical Exam   Constitutional: Patient appears well-developed and well-nourished. No distress.  HEENT: head atraumatic,  normocephalic, pupils equal and reactive to light, TM's without erythema or bulging, - left ear- impacted; no maxillary or frontal sinus tenderness , neck supple without lymphadenopathy, oropharynx pink and moist without exudate, right nares nasal polyp; left nares clear.  Cardiovascular: Normal rate, regular rhythm, S1/S2 present.   Pulmonary/Chest: Effort normal and breath sounds clear. No respiratory distress or retractions. Psychiatric: Patient has a normal mood and affect. behavior is normal. Judgment and thought content normal.  No results found for this or any previous visit (from the past 72 hour(s)).  Assessment & Plan  1. Nasal polyp - Ambulatory referral to ENT - mometasone (NASONEX) 50 MCG/ACT nasal spray; Place 2 sprays into the nose 2 (two) times daily.  Dispense: 17 g; Refill: 12  2. Moderate persistent asthma without complication - albuterol (PROVENTIL HFA;VENTOLIN HFA) 108 (90 Base) MCG/ACT inhaler; Inhale 2 puffs into the lungs every 6 (six) hours as needed for wheezing or shortness of breath.  Dispense: 1 Inhaler; Refill: 0  3. Impacted cerumen of left ear - Ear Lavage   -Red flags and when to present for emergency care or RTC including fever >101.6F, chest pain, shortness of breath, new/worsening/un-resolving symptoms, reviewed with patient at time of visit. Follow up and care instructions discussed and provided in AVS.

## 2018-06-26 ENCOUNTER — Other Ambulatory Visit: Payer: Self-pay | Admitting: Nurse Practitioner

## 2018-06-26 DIAGNOSIS — J4540 Moderate persistent asthma, uncomplicated: Secondary | ICD-10-CM

## 2018-06-29 ENCOUNTER — Ambulatory Visit (INDEPENDENT_AMBULATORY_CARE_PROVIDER_SITE_OTHER): Payer: BC Managed Care – PPO

## 2018-06-29 DIAGNOSIS — E538 Deficiency of other specified B group vitamins: Secondary | ICD-10-CM

## 2018-06-29 MED ORDER — CYANOCOBALAMIN 1000 MCG/ML IJ SOLN
1000.0000 ug | INTRAMUSCULAR | Status: DC
  Administered 2018-06-29 – 2019-11-28 (×10): 1000 ug via SUBCUTANEOUS

## 2018-06-29 NOTE — Progress Notes (Signed)
Patient came in for her B-12 injection. She/He tolerated it well. NKDA.  Lab Results  Component Value Date   VITAMINB12 206 02/10/2017

## 2018-06-30 ENCOUNTER — Other Ambulatory Visit: Payer: Self-pay | Admitting: Family Medicine

## 2018-06-30 DIAGNOSIS — E039 Hypothyroidism, unspecified: Secondary | ICD-10-CM

## 2018-06-30 NOTE — Telephone Encounter (Signed)
Refill request for thyroid medication. Levothyroxine to [facility].   Last visit: 01/10/18   Lab Results  Component Value Date   TSH 4.17 01/10/2018     Follow up on 07/12/18

## 2018-07-12 ENCOUNTER — Encounter: Payer: BC Managed Care – PPO | Admitting: Family Medicine

## 2018-08-27 ENCOUNTER — Other Ambulatory Visit: Payer: Self-pay | Admitting: Family Medicine

## 2018-08-27 DIAGNOSIS — E039 Hypothyroidism, unspecified: Secondary | ICD-10-CM

## 2018-09-04 ENCOUNTER — Ambulatory Visit (INDEPENDENT_AMBULATORY_CARE_PROVIDER_SITE_OTHER): Payer: BC Managed Care – PPO

## 2018-09-04 DIAGNOSIS — E538 Deficiency of other specified B group vitamins: Secondary | ICD-10-CM | POA: Diagnosis not present

## 2018-09-06 ENCOUNTER — Encounter: Payer: Self-pay | Admitting: Family Medicine

## 2018-09-06 ENCOUNTER — Ambulatory Visit: Payer: BC Managed Care – PPO | Admitting: Family Medicine

## 2018-09-06 VITALS — BP 110/64 | HR 90 | Temp 97.6°F | Resp 16 | Ht 63.0 in | Wt 122.4 lb

## 2018-09-06 DIAGNOSIS — E559 Vitamin D deficiency, unspecified: Secondary | ICD-10-CM

## 2018-09-06 DIAGNOSIS — R7303 Prediabetes: Secondary | ICD-10-CM

## 2018-09-06 DIAGNOSIS — D649 Anemia, unspecified: Secondary | ICD-10-CM | POA: Diagnosis not present

## 2018-09-06 DIAGNOSIS — E039 Hypothyroidism, unspecified: Secondary | ICD-10-CM

## 2018-09-06 DIAGNOSIS — E785 Hyperlipidemia, unspecified: Secondary | ICD-10-CM

## 2018-09-06 DIAGNOSIS — J3089 Other allergic rhinitis: Secondary | ICD-10-CM

## 2018-09-06 DIAGNOSIS — E538 Deficiency of other specified B group vitamins: Secondary | ICD-10-CM | POA: Diagnosis not present

## 2018-09-06 MED ORDER — VITAMIN D (ERGOCALCIFEROL) 1.25 MG (50000 UNIT) PO CAPS: 50000.0000 [IU] | ORAL_CAPSULE | ORAL | 0 refills | Status: DC

## 2018-09-06 MED ORDER — SYNTHROID 50 MCG PO TABS: ORAL_TABLET | ORAL | 0 refills | Status: DC

## 2018-09-06 MED ORDER — MONTELUKAST SODIUM 10 MG PO TABS: 10.0000 mg | ORAL_TABLET | Freq: Every day | ORAL | 5 refills | Status: DC

## 2018-09-06 NOTE — Progress Notes (Signed)
Name: Miranda Stafford   MRN: [id number]    DOB: 28-Nov-1955   Date:09/06/2018       Progress Note  Subjective  Chief Complaint  Chief Complaint  Patient presents with  . Medication Refill  . Hypothyroidism    Prefers Brand-Generic does not work as well for her-restless and fatigue.   . Asthma    Taking Benadryl for Sinuses and Singulair at night. Also has been compliant with her Breo   . Depression    HPI  Asthma Mild Intermittent  : she had a flare this past Summer but used the [place] daily and symptoms improved. She is on singulair and denies SOB, cough or wheezing at this time  Hypothyroidism:she has been taking medication on a regular basis, 50 mcg M-F but not on weekends , she prefers brand Synthroid. Her last TSH was at goal, we will recheck labs   B12: seen by Dr. Janese Banks for anemia and neutropenia, found to have lymphadenopathy and was referral to ENT, she was given reassurance. Getting B12 monthly and is now coming here for regular labs. We will recheck labs today  AR: still has poor sense of smell, seen by Dr. Cathlean Sauer, on flonase now, still on singulair . She stopped Xyzal because it was not working. Still has nasal polyps and daily nasal congestion but does not want to have surgery at this time  Menopausal Symptoms:she is not as fatigued at this time but still has night sweats and host flashes, she does not want to take medication for it.     Patient Active Problem List   Diagnosis Date Noted  . Other neutropenia ([place]) 01/10/2018  . Allergic rhinitis due to allergen 08/03/2017  . B12 deficiency 02/25/2017  . Family history of breast cancer in sister 05/24/2016  . Hypothyroidism 09/19/2015  . Asthma, moderate persistent, poorly-controlled 09/19/2015  . Perennial allergic rhinitis with seasonal variation 09/19/2015  . Major depression in remission ([place]) 09/19/2015    Past Surgical History:  Procedure Laterality Date  . ABDOMINAL HYSTERECTOMY  2005   fibroid tumor  .  ADENOIDECTOMY    . CESAREAN SECTION    . TUBAL LIGATION      Family History  Problem Relation Age of Onset  . Heart disease Mother   . Hypertension Mother   . Hyperlipidemia Mother   . SIDS Son   . Breast cancer Sister 84  . Hypothyroidism Sister   . Hypertension Sister     Social History   Socioeconomic History  . Marital status: Married    Spouse name: Not on file  . Number of children: Not on file  . Years of education: Not on file  . Highest education level: Not on file  Occupational History  . Not on file  Social Needs  . Financial resource strain: Not on file  . Food insecurity:    Worry: Not on file    Inability: Not on file  . Transportation needs:    Medical: Not on file    Non-medical: Not on file  Tobacco Use  . Smoking status: Former Smoker    Packs/day: 0.50    Years: 5.00    Pack years: 2.50    Types: Cigarettes    Last attempt to quit: 09/18/1985    Years since quitting: 32.9  . Smokeless tobacco: Never Used  Substance and Sexual Activity  . Alcohol use: Yes    Alcohol/week: 0.0 standard drinks    Comment: wine/socially/rarely  . Drug use: No  .  Sexual activity: Yes  Lifestyle  . Physical activity:    Days per week: Not on file    Minutes per session: Not on file  . Stress: Not on file  Relationships  . Social connections:    Talks on phone: Not on file    Gets together: Not on file    Attends religious service: Not on file    Active member of club or organization: Not on file    Attends meetings of clubs or organizations: Not on file    Relationship status: Not on file  . Intimate partner violence:    Fear of current or ex partner: Not on file    Emotionally abused: Not on file    Physically abused: Not on file    Forced sexual activity: Not on file  Other Topics Concern  . Not on file  Social History Narrative  . Not on file     Current Outpatient Medications:  .  albuterol (PROVENTIL HFA;VENTOLIN HFA) 108 (90 Base) MCG/ACT  inhaler, INHALE 2 PUFFS INTO THE LUNGS EVERY 6 HOURS AS NEEDED FOR WHEEZING OR SHORTNESS OF BREATH, Disp: 6.7 g, Rfl: 0 .  fluticasone (FLONASE) 50 MCG/ACT nasal spray, Place 2 sprays into both nostrils daily., Disp: , Rfl:  .  fluticasone furoate-vilanterol (BREO ELLIPTA) 100-25 MCG/INH AEPB, take 1 inhalation by mouth once daily, Disp: 60 each, Rfl: 2 .  montelukast (SINGULAIR) 10 MG tablet, Take 1 tablet (10 mg total) by mouth at bedtime., Disp: 30 tablet, Rfl: 5 .  SYNTHROID 50 MCG tablet, TAKE 1 TABLET BY MOUTH ONCE DAILY 5 DAYS WEEKLY AND OFF 2 DAYS A WEEK, Disp: 30 tablet, Rfl: 0 .  Vitamin D, Ergocalciferol, (DRISDOL) 50000 units CAPS capsule, Take 1 capsule (50,000 Units total) by mouth every 7 (seven) days., Disp: 12 capsule, Rfl: 0  Current Facility-Administered Medications:  .  cyanocobalamin ((VITAMIN B-12)) injection 1,000 mcg, 1,000 mcg, Subcutaneous, Q30 days, Steele Sizer, MD, 1,000 mcg at 09/04/18 1411  No Known Allergies  I personally reviewed active problem list, medication list, allergies with the patient/caregiver today.   ROS  Constitutional: Negative for fever or weight change.  Respiratory: Negative for cough and shortness of breath.   Cardiovascular: Negative for chest pain or palpitations.  Gastrointestinal: Negative for abdominal pain, no bowel changes.  Musculoskeletal: Negative for gait problem or joint swelling.  Skin: Negative for rash.  Neurological: Negative for dizziness or headache.  No other specific complaints in a complete review of systems (except as listed in HPI above).  Objective  Vitals:   09/06/18 1531  BP: 110/64  Pulse: 90  Resp: 16  Temp: 97.6 F (36.4 C)  TempSrc: Oral  SpO2: 96%  Weight: 122 lb 6.4 oz (55.5 kg)  Height: 5\' 3"  (1.6 m)    Body mass index is 21.68 kg/m.  Physical Exam  Constitutional: Patient appears well-developed and well-nourished.  No distress.  HEENT: head atraumatic, normocephalic, pupils equal and  reactive to light, boggy and pale turbinatesneck supple, throat within normal limits, no thyromegaly  Cardiovascular: Normal rate, regular rhythm and normal heart sounds.  No murmur heard. No BLE edema. Pulmonary/Chest: Effort normal and breath sounds normal. No respiratory distress. Abdominal: Soft.  There is no tenderness. Psychiatric: Patient has a normal mood and affect. behavior is normal. Judgment and thought content normal.  PHQ2/9: Depression screen [facility] 2/9 09/06/2018 05/26/2018 01/10/2018 08/03/2017 04/08/2017  Decreased Interest 0 0 0 0 0  Down, Depressed, Hopeless 0 0 0 0  0  PHQ - 2 Score 0 0 0 0 0  Altered sleeping 0 0 0 - -  Tired, decreased energy 0 0 0 - -  Change in appetite 0 0 0 - -  Feeling bad or failure about yourself  0 0 0 - -  Trouble concentrating 0 0 0 - -  Moving slowly or fidgety/restless 0 0 0 - -  Suicidal thoughts 0 0 0 - -  PHQ-9 Score 0 0 0 - -  Difficult doing work/chores Not difficult at all - Not difficult at all - -     Fall Risk: Fall Risk  09/06/2018 05/26/2018 01/10/2018 08/03/2017 04/08/2017  Falls in the past year? No No No Yes No  Number falls in past yr: - - - 1 -  Injury with Fall? - - - No -     Functional Status Survey: Is the patient deaf or have difficulty hearing?: No Does the patient have difficulty seeing, even when wearing glasses/contacts?: Yes(glasses and contacts) Does the patient have difficulty concentrating, remembering, or making decisions?: No Does the patient have difficulty walking or climbing stairs?: No Does the patient have difficulty dressing or bathing?: No Does the patient have difficulty doing errands alone such as visiting a doctor's office or shopping?: No   Assessment & Plan  1. B12 deficiency  - Vitamin B12  2. Hypothyroidism, adult  - TSH - SYNTHROID 50 MCG tablet; TAKE 1 TABLET BY MOUTH ONCE DAILY 5 DAYS WEEKLY AND OFF 2 DAYS A WEEK  Dispense: 30 tablet; Refill: 0 It was not sent to pharmacy, we will  wait for lab results  3. Vitamin D deficiency  Take rx and once done take 2000 mcg daily and monitor  4. Anemia, unspecified type  - CBC with Differential/Platelet - Iron, TIBC and Ferritin Panel - Vitamin B12  5. Pre-diabetes  - Hemoglobin A1c  6. Dyslipidemia  - Lipid panel  7. Non-seasonal allergic rhinitis due to other allergic trigger  - montelukast (SINGULAIR) 10 MG tablet; Take 1 tablet (10 mg total) by mouth at bedtime.  Dispense: 30 tablet; Refill: 5

## 2018-09-07 LAB — CBC WITH DIFFERENTIAL/PLATELET
Basophils Absolute: 39 cells/uL (ref 0–200)
Basophils Relative: 1 %
Eosinophils Absolute: 386 cells/uL (ref 15–500)
Eosinophils Relative: 9.9 %
HCT: 31.3 % — ABNORMAL LOW (ref 35.0–45.0)
Hemoglobin: 10.1 g/dL — ABNORMAL LOW (ref 11.7–15.5)
Lymphs Abs: 1903 cells/uL (ref 850–3900)
MCH: 26.4 pg — ABNORMAL LOW (ref 27.0–33.0)
MCHC: 32.3 g/dL (ref 32.0–36.0)
MCV: 81.7 fL (ref 80.0–100.0)
MPV: 11.9 fL (ref 7.5–12.5)
Monocytes Relative: 6 %
Neutro Abs: 1338 cells/uL — ABNORMAL LOW (ref 1500–7800)
Neutrophils Relative %: 34.3 %
Platelets: 205 10*3/uL (ref 140–400)
RBC: 3.83 10*6/uL (ref 3.80–5.10)
RDW: 13.1 % (ref 11.0–15.0)
Total Lymphocyte: 48.8 %
WBC mixed population: 234 cells/uL (ref 200–950)
WBC: 3.9 10*3/uL (ref 3.8–10.8)

## 2018-09-07 LAB — HEMOGLOBIN A1C
Hgb A1c MFr Bld: 6 % of total Hgb — ABNORMAL HIGH (ref <5.7)
Mean Plasma Glucose: 126 (calc)
eAG (mmol/L): 7 (calc)

## 2018-09-07 LAB — IRON,TIBC AND FERRITIN PANEL
%SAT: 22 % (calc) (ref 16–45)
Ferritin: 120 ng/mL (ref 16–232)
Iron: 61 ug/dL (ref 45–160)
TIBC: 280 mcg/dL (calc) (ref 250–450)

## 2018-09-07 LAB — LIPID PANEL
Cholesterol: 283 mg/dL — ABNORMAL HIGH (ref <200)
HDL: 83 mg/dL (ref >50)
LDL Cholesterol (Calc): 173 mg/dL (calc) — ABNORMAL HIGH
Non-HDL Cholesterol (Calc): 200 mg/dL (calc) — ABNORMAL HIGH (ref <130)
Total CHOL/HDL Ratio: 3.4 (calc) (ref <5.0)
Triglycerides: 132 mg/dL (ref <150)

## 2018-09-07 LAB — VITAMIN B12: Vitamin B-12: 1188 pg/mL — ABNORMAL HIGH (ref 200–1100)

## 2018-09-07 LAB — TSH: TSH: 5.98 mIU/L — ABNORMAL HIGH (ref 0.40–4.50)

## 2018-09-10 ENCOUNTER — Other Ambulatory Visit: Payer: Self-pay | Admitting: Family Medicine

## 2018-09-10 DIAGNOSIS — E039 Hypothyroidism, unspecified: Secondary | ICD-10-CM

## 2018-09-10 MED ORDER — SYNTHROID 75 MCG PO TABS: 75.0000 ug | ORAL_TABLET | Freq: Every day | ORAL | 0 refills | Status: DC

## 2018-09-10 MED ORDER — LEVOTHYROXINE SODIUM 75 MCG PO TABS: 75.0000 ug | ORAL_TABLET | Freq: Every day | ORAL | 1 refills | Status: DC

## 2018-10-03 ENCOUNTER — Ambulatory Visit (INDEPENDENT_AMBULATORY_CARE_PROVIDER_SITE_OTHER): Payer: BC Managed Care – PPO

## 2018-10-03 DIAGNOSIS — E538 Deficiency of other specified B group vitamins: Secondary | ICD-10-CM | POA: Diagnosis not present

## 2018-10-03 NOTE — Progress Notes (Signed)
Patient came in for her B-12 injection. She tolerated it well. NKDA.   

## 2018-10-17 ENCOUNTER — Emergency Department: Payer: BC Managed Care – PPO

## 2018-10-17 ENCOUNTER — Emergency Department
Admission: EM | Admit: 2018-10-17 | Discharge: 2018-10-17 | Disposition: A | Discharge status: Home / Self Care | Payer: BC Managed Care – PPO | Attending: Emergency Medicine | Admitting: Emergency Medicine

## 2018-10-17 DIAGNOSIS — I10 Essential (primary) hypertension: Secondary | ICD-10-CM | POA: Insufficient documentation

## 2018-10-17 DIAGNOSIS — R0789 Other chest pain: Secondary | ICD-10-CM | POA: Diagnosis not present

## 2018-10-17 DIAGNOSIS — R079 Chest pain, unspecified: Secondary | ICD-10-CM | POA: Diagnosis present

## 2018-10-17 DIAGNOSIS — J45909 Unspecified asthma, uncomplicated: Secondary | ICD-10-CM | POA: Insufficient documentation

## 2018-10-17 DIAGNOSIS — Z87891 Personal history of nicotine dependence: Secondary | ICD-10-CM | POA: Diagnosis not present

## 2018-10-17 DIAGNOSIS — E039 Hypothyroidism, unspecified: Secondary | ICD-10-CM | POA: Diagnosis not present

## 2018-10-17 DIAGNOSIS — Z79899 Other long term (current) drug therapy: Secondary | ICD-10-CM | POA: Diagnosis not present

## 2018-10-17 LAB — CBC
HCT: 35.2 % — ABNORMAL LOW (ref 36.0–46.0)
Hemoglobin: 10.9 g/dL — ABNORMAL LOW (ref 12.0–15.0)
MCH: 26.3 pg (ref 26.0–34.0)
MCHC: 31 g/dL (ref 30.0–36.0)
MCV: 84.8 fL (ref 80.0–100.0)
Platelets: 197 10*3/uL (ref 150–400)
RBC: 4.15 MIL/uL (ref 3.87–5.11)
RDW: 14.3 % (ref 11.5–15.5)
WBC: 3.8 10*3/uL — ABNORMAL LOW (ref 4.0–10.5)
nRBC: 0 % (ref 0.0–0.2)

## 2018-10-17 LAB — COMPREHENSIVE METABOLIC PANEL
ALT: 23 U/L (ref 0–44)
AST: 39 U/L (ref 15–41)
Albumin: 4.4 g/dL (ref 3.5–5.0)
Alkaline Phosphatase: 46 U/L (ref 38–126)
Anion gap: 10 (ref 5–15)
BUN: 13 mg/dL (ref 6–20)
CO2: 26 mmol/L (ref 22–32)
Calcium: 9.3 mg/dL (ref 8.9–10.3)
Chloride: 104 mmol/L (ref 98–111)
Creatinine, Ser: 0.82 mg/dL (ref 0.44–1.00)
GFR calc Af Amer: >60 mL/min (ref >60)
GFR calc non Af Amer: >60 mL/min (ref >60)
Glucose, Bld: 97 mg/dL (ref 70–99)
Potassium: 3.7 mmol/L (ref 3.5–5.1)
Sodium: 140 mmol/L (ref 135–145)
Total Bilirubin: 0.5 mg/dL (ref 0.3–1.2)
Total Protein: 7.7 g/dL (ref 6.5–8.1)

## 2018-10-17 LAB — TROPONIN I: Troponin I: <0.03 ng/mL (ref <0.03)

## 2018-10-17 MED ORDER — MORPHINE SULFATE (PF) 4 MG/ML IV SOLN
4.0000 mg | Freq: Once | INTRAVENOUS | Status: AC
  Administered 2018-10-17: 4 mg via INTRAVENOUS
  Filled 2018-10-17: qty 1

## 2018-10-17 MED ORDER — IOHEXOL 350 MG/ML SOLN
75.0000 mL | Freq: Once | INTRAVENOUS | Status: AC | PRN
  Administered 2018-10-17: 75 mL via INTRAVENOUS

## 2018-10-17 MED ORDER — NAPROXEN 500 MG PO TABS: 500.0000 mg | ORAL_TABLET | Freq: Two times a day (BID) | ORAL | 1 refills | Status: DC

## 2018-10-17 MED ORDER — ONDANSETRON HCL 4 MG/2ML IJ SOLN
4.0000 mg | Freq: Once | INTRAMUSCULAR | Status: AC
  Administered 2018-10-17: 4 mg via INTRAVENOUS
  Filled 2018-10-17: qty 2

## 2018-10-17 NOTE — ED Notes (Signed)
FIRST NURSE NOTE: CP started this morning rad to r/s

## 2018-10-17 NOTE — ED Triage Notes (Signed)
CP started this morning with rad to r/s Pt denies cardiac hx.

## 2018-10-17 NOTE — ED Provider Notes (Signed)
[facility] Emergency Department Provider Note   ____________________________________________    I have reviewed the triage vital signs and the nursing notes.   HISTORY  Chief Complaint Chest Pain     HPI Miranda Stafford is a 62 y.o. female who presents with complaints of right posterior chest pain.  Patient reports abrupt onset of pain after getting out of the shower this morning.  She reported it was severe initially felt in her anterior central chest but then moved to her posterior right chest.  She is never had this before.  Denies leg swelling or pain.  No recent travel.  No history of blood clots.  No history of ACS.  Denies fevers chills or cough.  No nausea vomiting or diaphoresis.  Currently the pain is moderate and sharp   Past Medical History:  Diagnosis Date  . Allergy   . Anemia   . Asthma   . Fibroid tumor   . Herpes zoster   . Hyperlipidemia   . Hypertension   . Hypothyroidism   . Insomnia   . Osteopenia   . Thyroid disease     Patient Active Problem List   Diagnosis Date Noted  . Other neutropenia ([place]) 01/10/2018  . Allergic rhinitis due to allergen 08/03/2017  . B12 deficiency 02/25/2017  . Family history of breast cancer in sister 05/24/2016  . Hypothyroidism 09/19/2015  . Asthma, moderate persistent, poorly-controlled 09/19/2015  . Perennial allergic rhinitis with seasonal variation 09/19/2015  . Major depression in remission ([place]) 09/19/2015    Past Surgical History:  Procedure Laterality Date  . ABDOMINAL HYSTERECTOMY  2005   fibroid tumor  . ADENOIDECTOMY    . CESAREAN SECTION    . TUBAL LIGATION      Prior to Admission medications   Medication Sig Start Date End Date Taking? Authorizing Provider  albuterol (PROVENTIL HFA;VENTOLIN HFA) 108 (90 Base) MCG/ACT inhaler INHALE 2 PUFFS INTO THE LUNGS EVERY 6 HOURS AS NEEDED FOR WHEEZING OR SHORTNESS OF BREATH 06/26/18  Yes Poulose, Bethel Born, NP  montelukast  (SINGULAIR) 10 MG tablet Take 1 tablet (10 mg total) by mouth at bedtime. 09/06/18  Yes Steele Sizer, MD  SYNTHROID 75 MCG tablet Take 1 tablet (75 mcg total) by mouth daily before breakfast. M-F 09/10/18  Yes Sowles, Drue Stager, MD  Vitamin D, Ergocalciferol, (DRISDOL) 50000 units CAPS capsule Take 1 capsule (50,000 Units total) by mouth every 7 (seven) days. 09/06/18  Yes Sowles, Drue Stager, MD  fluticasone (FLONASE) 50 MCG/ACT nasal spray Place 2 sprays into both nostrils daily.    [provider]  naproxen (NAPROSYN) 500 MG tablet Take 1 tablet (500 mg total) by mouth 2 (two) times daily with a meal. 10/17/18   Lavonia Drafts, MD     Allergies Patient has no known allergies.  Family History  Problem Relation Age of Onset  . Heart disease Mother   . Hypertension Mother   . Hyperlipidemia Mother   . SIDS Son   . Breast cancer Sister 43  . Hypothyroidism Sister   . Hypertension Sister     Social History Social History   Tobacco Use  . Smoking status: Former Smoker    Packs/day: 0.50    Years: 5.00    Pack years: 2.50    Types: Cigarettes    Last attempt to quit: 09/18/1985    Years since quitting: 33.1  . Smokeless tobacco: Never Used  Substance Use Topics  . Alcohol use: Yes  Alcohol/week: 0.0 standard drinks    Comment: wine/socially/rarely  . Drug use: No    Review of Systems  Constitutional: No fever/chills Eyes: No visual changes.  ENT: No neck pain Cardiovascular: As above Respiratory: Denies shortness of breath. Gastrointestinal: No abdominal pain.  Genitourinary: Negative for dysuria. Musculoskeletal: Negative for back pain. Skin: Negative for rash. Neurological: Negative for headaches   ____________________________________________   PHYSICAL EXAM:  VITAL SIGNS: ED Triage Vitals  Enc Vitals Group     BP 10/17/18 0808 138/75     Pulse Rate 10/17/18 0808 (!) 45     Resp --      Temp 10/17/18 0808 97.9 F (36.6 C)     Temp Source 10/17/18  0808 Axillary     SpO2 10/17/18 0808 100 %     Weight 10/17/18 0810 49.9 kg (110 lb)     Height --      Head Circumference --      Peak Flow --      Pain Score 10/17/18 0810 7     Pain Loc --      Pain Edu? --      Excl. in [place]? --     Constitutional: Alert and oriented. No acute distress. Pleasant and interactive Eyes: Conjunctivae are normal.  Head: Atraumatic. Nose: No congestion/rhinnorhea. Mouth/Throat: Mucous membranes are moist.   Neck:  Painless ROM Cardiovascular: Normal rate, regular rhythm. Grossly normal heart sounds.  Good peripheral circulation. Respiratory: Normal respiratory effort.  No retractions. Lungs CTAB. Gastrointestinal: Soft and nontender. No distention.  No CVA tenderness. Genitourinary: deferred Musculoskeletal: No lower extremity tenderness nor edema.  Warm and well perfused Neurologic:  Normal speech and language. No gross focal neurologic deficits are appreciated.  Skin:  Skin is warm, dry and intact. No rash noted. Psychiatric: Mood and affect are normal. Speech and behavior are normal.  ____________________________________________   LABS (all labs ordered are listed, but only abnormal results are displayed)  Labs Reviewed  CBC - Abnormal; Notable for the following components:      Result Value   WBC 3.8 (*)    Hemoglobin 10.9 (*)    HCT 35.2 (*)    All other components within normal limits  COMPREHENSIVE METABOLIC PANEL  TROPONIN I   ____________________________________________  EKG  EKG unremarkable ____________________________________________  RADIOLOGY  CT angiography negative for PE ____________________________________________   PROCEDURES  Procedure(s) performed: No  Procedures   Critical Care performed: No ____________________________________________   INITIAL IMPRESSION / ASSESSMENT AND PLAN / ED COURSE  Pertinent labs & imaging results that were available during my care of the patient were reviewed by me and  considered in my medical decision making (see chart for details).  Patient presents with posterior right chest/back pain differential includes musculoskeletal pain, PE, pleurisy, pneumonia.  Not consistent with ACS.  Will treat with IV morphine, IV Zofran, pending labs.  Chest x-ray normal  Will obtain CT angiography given abrupt onset.  CT angiography is negative for PE and overall unremarkable.  Patient feels much better after IV morphine and IV Zofran.  She feels like she would like to go home I think this is reasonable with strict return precautions, outpatient follow-up for further evaluation    ____________________________________________   FINAL CLINICAL IMPRESSION(S) / ED DIAGNOSES  Final diagnoses:  Atypical chest pain  Chest wall pain        Note:  This document was prepared using Dragon voice recognition software and may include unintentional dictation errors.  Lavonia Drafts, MD 10/17/18 1310

## 2018-10-24 ENCOUNTER — Ambulatory Visit: Payer: BC Managed Care – PPO | Admitting: Family Medicine

## 2018-10-24 ENCOUNTER — Encounter: Payer: Self-pay | Admitting: Family Medicine

## 2018-10-24 VITALS — BP 120/66 | HR 66 | Resp 16 | Ht 63.0 in | Wt 119.3 lb

## 2018-10-24 DIAGNOSIS — R0789 Other chest pain: Secondary | ICD-10-CM

## 2018-10-24 DIAGNOSIS — J45901 Unspecified asthma with (acute) exacerbation: Secondary | ICD-10-CM

## 2018-10-24 DIAGNOSIS — D708 Other neutropenia: Secondary | ICD-10-CM

## 2018-10-24 DIAGNOSIS — E538 Deficiency of other specified B group vitamins: Secondary | ICD-10-CM

## 2018-10-24 DIAGNOSIS — E039 Hypothyroidism, unspecified: Secondary | ICD-10-CM | POA: Diagnosis not present

## 2018-10-24 MED ORDER — PREDNISONE 20 MG PO TABS: 20.0000 mg | ORAL_TABLET | Freq: Two times a day (BID) | ORAL | 0 refills | Status: DC

## 2018-10-24 MED ORDER — CYCLOBENZAPRINE HCL 5 MG PO TABS: 5.0000 mg | ORAL_TABLET | Freq: Every day | ORAL | 0 refills | Status: DC

## 2018-10-24 MED ORDER — FLUTICASONE FUROATE-VILANTEROL 100-25 MCG/INH IN AEPB: 1.0000 | INHALATION_SPRAY | Freq: Every day | RESPIRATORY_TRACT | 1 refills | Status: DC

## 2018-10-24 NOTE — Progress Notes (Signed)
Name: Miranda Stafford   MRN: [id number]    DOB: Sep 05, 1956   Date:10/24/2018       Progress Note  Subjective  Chief Complaint  Chief Complaint  Patient presents with  . Asthma    she would like to be put back on Breo for her asthma.    HPI  Asthma: she has noticed worsening of nasal congestion, rhinorrhea and cough over the past 10  days, she states over the weekend developed some upper right back pain, but not dysuria, urgency or frequency. No history of kidney stones. She states occasional wheezing, pain is reproducible with movement and touch, no rashes No previous history of DVT or heart disease, she went to [facility] and troponin negative. WBC was up but she has a history of leucocytosis and was seen by hematologist in the past. She states she is feeling better but still very uncomfortable.    Patient Active Problem List   Diagnosis Date Noted  . Other neutropenia ([place]) 01/10/2018  . Allergic rhinitis due to allergen 08/03/2017  . B12 deficiency 02/25/2017  . Family history of breast cancer in sister 05/24/2016  . Hypothyroidism 09/19/2015  . Asthma, moderate persistent, poorly-controlled 09/19/2015  . Perennial allergic rhinitis with seasonal variation 09/19/2015  . Major depression in remission ([place]) 09/19/2015    Past Surgical History:  Procedure Laterality Date  . ABDOMINAL HYSTERECTOMY  2005   fibroid tumor  . ADENOIDECTOMY    . CESAREAN SECTION    . TUBAL LIGATION      Family History  Problem Relation Age of Onset  . Heart disease Mother   . Hypertension Mother   . Hyperlipidemia Mother   . SIDS Son   . Breast cancer Sister 39  . Hypothyroidism Sister   . Hypertension Sister     Social History   Socioeconomic History  . Marital status: Married    Spouse name: Not on file  . Number of children: Not on file  . Years of education: Not on file  . Highest education level: Not on file  Occupational History  . Not on file  Social Needs  . Financial resource  strain: Not on file  . Food insecurity:    Worry: Not on file    Inability: Not on file  . Transportation needs:    Medical: Not on file    Non-medical: Not on file  Tobacco Use  . Smoking status: Former Smoker    Packs/day: 0.50    Years: 5.00    Pack years: 2.50    Types: Cigarettes    Last attempt to quit: 09/18/1985    Years since quitting: 33.1  . Smokeless tobacco: Never Used  Substance and Sexual Activity  . Alcohol use: Yes    Alcohol/week: 0.0 standard drinks    Comment: wine/socially/rarely  . Drug use: No  . Sexual activity: Yes  Lifestyle  . Physical activity:    Days per week: Not on file    Minutes per session: Not on file  . Stress: Not on file  Relationships  . Social connections:    Talks on phone: Not on file    Gets together: Not on file    Attends religious service: Not on file    Active member of club or organization: Not on file    Attends meetings of clubs or organizations: Not on file    Relationship status: Not on file  . Intimate partner violence:    Fear of current or ex partner:  Not on file    Emotionally abused: Not on file    Physically abused: Not on file    Forced sexual activity: Not on file  Other Topics Concern  . Not on file  Social History Narrative  . Not on file     Current Outpatient Medications:  .  albuterol (PROVENTIL HFA;VENTOLIN HFA) 108 (90 Base) MCG/ACT inhaler, INHALE 2 PUFFS INTO THE LUNGS EVERY 6 HOURS AS NEEDED FOR WHEEZING OR SHORTNESS OF BREATH, Disp: 6.7 g, Rfl: 0 .  fluticasone (FLONASE) 50 MCG/ACT nasal spray, Place 2 sprays into both nostrils daily., Disp: , Rfl:  .  montelukast (SINGULAIR) 10 MG tablet, Take 1 tablet (10 mg total) by mouth at bedtime., Disp: 30 tablet, Rfl: 5 .  SYNTHROID 75 MCG tablet, Take 1 tablet (75 mcg total) by mouth daily before breakfast. M-F, Disp: 30 tablet, Rfl: 0 .  Vitamin D, Ergocalciferol, (DRISDOL) 50000 units CAPS capsule, Take 1 capsule (50,000 Units total) by mouth every 7  (seven) days., Disp: 12 capsule, Rfl: 0 .  cyclobenzaprine (FLEXERIL) 5 MG tablet, Take 1 tablet (5 mg total) by mouth at bedtime., Disp: 30 tablet, Rfl: 0 .  fluticasone furoate-vilanterol (BREO ELLIPTA) 100-25 MCG/INH AEPB, Inhale 1 puff into the lungs daily., Disp: 60 each, Rfl: 1 .  predniSONE (DELTASONE) 20 MG tablet, Take 1 tablet (20 mg total) by mouth 2 (two) times daily with a meal., Disp: 10 tablet, Rfl: 0  Current Facility-Administered Medications:  .  cyanocobalamin ((VITAMIN B-12)) injection 1,000 mcg, 1,000 mcg, Subcutaneous, Q30 days, Steele Sizer, MD, 1,000 mcg at 10/03/18 1319  No Known Allergies  I personally reviewed active problem list, medication list, allergies, family history, social history with the patient/caregiver today.   ROS  Constitutional: Negative for fever or weight change.  Respiratory: Positive  for cough and shortness of breath.   Cardiovascular: Positive  for chest pain but no  palpitations.  Gastrointestinal: Negative for abdominal pain, no bowel changes.  Musculoskeletal: Negative for gait problem or joint swelling.  Skin: Negative for rash.  Neurological: Negative for dizziness or headache.  No other specific complaints in a complete review of systems (except as listed in HPI above).  Objective  Vitals:   10/24/18 1119 10/24/18 1120  BP:  120/66  Pulse:  66  Resp:  16  SpO2:  95%  Weight:  119 lb 4.8 oz (54.1 kg)  Height: 5\' 3"  (1.6 m) 5\' 3"  (1.6 m)    Body mass index is 21.13 kg/m.  Physical Exam  Constitutional: Patient appears well-developed and well-nourished. No distress.  HEENT: head atraumatic, normocephalic, pupils equal and reactive to light, boggy turbinates neck supple, throat within normal limits Cardiovascular: Normal rate, regular rhythm and normal heart sounds.  No murmur heard. No BLE edema.Negative for calf tenderness  Pulmonary/Chest: Effort normal and breath sounds normal. No respiratory distress. Abdominal:  Soft.  There is no tenderness. Psychiatric: Patient has a normal mood and affect. behavior is normal. Judgment and thought content normal.  Recent Results (from the past 2160 hour(s))  TSH     Status: Abnormal   Collection Time: 09/06/18  4:20 PM  Result Value Ref Range   TSH 5.98 (H) 0.40 - 4.50 mIU/L  Lipid panel     Status: Abnormal   Collection Time: 09/06/18  4:20 PM  Result Value Ref Range   Cholesterol 283 (H) <200 mg/dL   HDL 83 >50 mg/dL   Triglycerides 132 <150 mg/dL   LDL Cholesterol (Calc)  173 (H) mg/dL (calc)    Comment: Reference range: <100 . Desirable range <100 mg/dL for primary prevention;   <70 mg/dL for patients with CHD or diabetic patients  with > or = 2 CHD risk factors. Marland Kitchen LDL-C is now calculated using the Martin-Hopkins  calculation, which is a validated novel method providing  better accuracy than the Friedewald equation in the  estimation of LDL-C.  Cresenciano Genre et al. Annamaria Helling. 2637;858(85): 2061-2068  (http://education.[facility].com/faq/FAQ164)    Total CHOL/HDL Ratio 3.4 <5.0 (calc)   Non-HDL Cholesterol (Calc) 200 (H) <130 mg/dL (calc)    Comment: For patients with diabetes plus 1 major ASCVD risk  factor, treating to a non-HDL-C goal of <100 mg/dL  (LDL-C of <70 mg/dL) is considered a therapeutic  option.   Hemoglobin A1c     Status: Abnormal   Collection Time: 09/06/18  4:20 PM  Result Value Ref Range   Hgb A1c MFr Bld 6.0 (H) <5.7 % of total Hgb    Comment: For someone without known diabetes, a hemoglobin  A1c value between 5.7% and 6.4% is consistent with prediabetes and should be confirmed with a  follow-up test. . For someone with known diabetes, a value <7% indicates that their diabetes is well controlled. A1c targets should be individualized based on duration of diabetes, age, comorbid conditions, and other considerations. . This assay result is consistent with an increased risk of diabetes. . Currently, no consensus exists  regarding use of hemoglobin A1c for diagnosis of diabetes for children. .    Mean Plasma Glucose 126 (calc)   eAG (mmol/L) 7.0 (calc)  CBC with Differential/Platelet     Status: Abnormal   Collection Time: 09/06/18  4:20 PM  Result Value Ref Range   WBC 3.9 3.8 - 10.8 Thousand/uL   RBC 3.83 3.80 - 5.10 Million/uL   Hemoglobin 10.1 (L) 11.7 - 15.5 g/dL   HCT 31.3 (L) 35.0 - 45.0 %   MCV 81.7 80.0 - 100.0 fL   MCH 26.4 (L) 27.0 - 33.0 pg   MCHC 32.3 32.0 - 36.0 g/dL   RDW 13.1 11.0 - 15.0 %   Platelets 205 140 - 400 Thousand/uL   MPV 11.9 7.5 - 12.5 fL   Neutro Abs 1,338 (L) 1,500 - 7,800 cells/uL   Lymphs Abs 1,903 850 - 3,900 cells/uL   WBC mixed population 234 200 - 950 cells/uL   Eosinophils Absolute 386 15 - 500 cells/uL   Basophils Absolute 39 0 - 200 cells/uL   Neutrophils Relative % 34.3 %   Total Lymphocyte 48.8 %   Monocytes Relative 6.0 %   Eosinophils Relative 9.9 %   Basophils Relative 1.0 %  Iron, TIBC and Ferritin Panel     Status: None   Collection Time: 09/06/18  4:20 PM  Result Value Ref Range   Iron 61 45 - 160 mcg/dL   TIBC 280 250 - 450 mcg/dL (calc)   %SAT 22 16 - 45 % (calc)   Ferritin 120 16 - 232 ng/mL  Vitamin B12     Status: Abnormal   Collection Time: 09/06/18  4:20 PM  Result Value Ref Range   Vitamin B-12 1,188 (H) 200 - 1,100 pg/mL  CBC     Status: Abnormal   Collection Time: 10/17/18  8:38 AM  Result Value Ref Range   WBC 3.8 (L) 4.0 - 10.5 K/uL   RBC 4.15 3.87 - 5.11 MIL/uL   Hemoglobin 10.9 (L) 12.0 - 15.0 g/dL   HCT  35.2 (L) 36.0 - 46.0 %   MCV 84.8 80.0 - 100.0 fL   MCH 26.3 26.0 - 34.0 pg   MCHC 31.0 30.0 - 36.0 g/dL   RDW 14.3 11.5 - 15.5 %   Platelets 197 150 - 400 K/uL   nRBC 0.0 0.0 - 0.2 %    Comment: Performed at [facility], [address] [place], [place] [address]  Comprehensive metabolic panel     Status: None   Collection Time: 10/17/18  8:38 AM  Result Value Ref Range   Sodium 140 135 - 145 mmol/L    Potassium 3.7 3.5 - 5.1 mmol/L   Chloride 104 98 - 111 mmol/L   CO2 26 22 - 32 mmol/L   Glucose, Bld 97 70 - 99 mg/dL   BUN 13 6 - 20 mg/dL   Creatinine, Ser 0.82 0.44 - 1.00 mg/dL   Calcium 9.3 8.9 - 10.3 mg/dL   Total Protein 7.7 6.5 - 8.1 g/dL   Albumin 4.4 3.5 - 5.0 g/dL   AST 39 15 - 41 U/L   ALT 23 0 - 44 U/L   Alkaline Phosphatase 46 38 - 126 U/L   Total Bilirubin 0.5 0.3 - 1.2 mg/dL   GFR calc non Af Amer >60 >60 mL/min   GFR calc Af Amer >60 >60 mL/min   Anion gap 10 5 - 15    Comment: Performed at [facility], [address] [place], [place] [address]  Troponin I - ONCE - STAT     Status: None   Collection Time: 10/17/18  8:38 AM  Result Value Ref Range   Troponin I <0.03 <0.03 ng/mL    Comment: Performed at [facility], [address] [place], [place] [address]      PHQ2/9: Depression screen [facility] 2/9 09/06/2018 05/26/2018 01/10/2018 08/03/2017 04/08/2017  Decreased Interest 0 0 0 0 0  Down, Depressed, Hopeless 0 0 0 0 0  PHQ - 2 Score 0 0 0 0 0  Altered sleeping 0 0 0 - -  Tired, decreased energy 0 0 0 - -  Change in appetite 0 0 0 - -  Feeling bad or failure about yourself  0 0 0 - -  Trouble concentrating 0 0 0 - -  Moving slowly or fidgety/restless 0 0 0 - -  Suicidal thoughts 0 0 0 - -  PHQ-9 Score 0 0 0 - -  Difficult doing work/chores Not difficult at all - Not difficult at all - -     Fall Risk: Fall Risk  10/24/2018 09/06/2018 05/26/2018 01/10/2018 08/03/2017  Falls in the past year? 0 No No No Yes  Number falls in past yr: - - - - 1  Injury with Fall? - - - - No     Assessment & Plan  1. Hypothyroidism, adult  - TSH  2. Atypical Chest pain - cyclobenzaprine (FLEXERIL) 5 MG tablet; Take 1 tablet (5 mg total) by mouth at bedtime.  Dispense: 30 tablet; Refill: 0  3. Mild asthma with exacerbation, unspecified whether persistent  - predniSONE (DELTASONE) 20 MG tablet; Take 1 tablet (20 mg total) by mouth 2 (two) times daily  with a meal.  Dispense: 10 tablet; Refill: 0 - fluticasone furoate-vilanterol (BREO ELLIPTA) 100-25 MCG/INH AEPB; Inhale 1 puff into the lungs daily.  Dispense: 60 each; Refill: 1  4. Other neutropenia ([place])  Seen by hematologist in the past, we will recheck today  - CBC with Differential/Platelet

## 2018-11-06 ENCOUNTER — Other Ambulatory Visit: Payer: Self-pay | Admitting: Family Medicine

## 2018-12-05 ENCOUNTER — Other Ambulatory Visit: Payer: Self-pay | Admitting: Family Medicine

## 2018-12-05 NOTE — Telephone Encounter (Signed)
Refill request for general medication. Vitamin D to [facility].   Last office visit 10/24/2018   Follow up on 12/18/2018

## 2018-12-12 ENCOUNTER — Encounter: Payer: Self-pay | Admitting: Family Medicine

## 2018-12-13 ENCOUNTER — Ambulatory Visit (INDEPENDENT_AMBULATORY_CARE_PROVIDER_SITE_OTHER): Payer: BC Managed Care – PPO

## 2018-12-13 DIAGNOSIS — E538 Deficiency of other specified B group vitamins: Secondary | ICD-10-CM | POA: Diagnosis not present

## 2018-12-13 LAB — CBC WITH DIFFERENTIAL/PLATELET
Absolute Monocytes: 296 cells/uL (ref 200–950)
Basophils Absolute: 38 cells/uL (ref 0–200)
Basophils Relative: 1 %
Eosinophils Absolute: 635 cells/uL — ABNORMAL HIGH (ref 15–500)
Eosinophils Relative: 16.7 %
HCT: 32.3 % — ABNORMAL LOW (ref 35.0–45.0)
Hemoglobin: 10.3 g/dL — ABNORMAL LOW (ref 11.7–15.5)
Lymphs Abs: 1702 cells/uL (ref 850–3900)
MCH: 26.7 pg — ABNORMAL LOW (ref 27.0–33.0)
MCHC: 31.9 g/dL — ABNORMAL LOW (ref 32.0–36.0)
MCV: 83.7 fL (ref 80.0–100.0)
MPV: 11.6 fL (ref 7.5–12.5)
Monocytes Relative: 7.8 %
Neutro Abs: 1129 cells/uL — ABNORMAL LOW (ref 1500–7800)
Neutrophils Relative %: 29.7 %
Platelets: 179 10*3/uL (ref 140–400)
RBC: 3.86 10*6/uL (ref 3.80–5.10)
RDW: 13.6 % (ref 11.0–15.0)
Total Lymphocyte: 44.8 %
WBC: 3.8 10*3/uL (ref 3.8–10.8)

## 2018-12-13 LAB — TSH: TSH: 4.57 mIU/L — ABNORMAL HIGH (ref 0.40–4.50)

## 2018-12-18 ENCOUNTER — Encounter: Payer: Self-pay | Admitting: Family Medicine

## 2018-12-18 ENCOUNTER — Ambulatory Visit (INDEPENDENT_AMBULATORY_CARE_PROVIDER_SITE_OTHER): Payer: BC Managed Care – PPO | Admitting: Family Medicine

## 2018-12-18 VITALS — BP 108/64 | HR 76 | Temp 98.4°F | Resp 16 | Ht 63.0 in | Wt 121.6 lb

## 2018-12-18 DIAGNOSIS — Z01419 Encounter for gynecological examination (general) (routine) without abnormal findings: Secondary | ICD-10-CM | POA: Diagnosis not present

## 2018-12-18 DIAGNOSIS — Z1239 Encounter for other screening for malignant neoplasm of breast: Secondary | ICD-10-CM | POA: Diagnosis not present

## 2018-12-18 DIAGNOSIS — Z13820 Encounter for screening for osteoporosis: Secondary | ICD-10-CM | POA: Diagnosis not present

## 2018-12-18 DIAGNOSIS — E039 Hypothyroidism, unspecified: Secondary | ICD-10-CM

## 2018-12-18 DIAGNOSIS — Z23 Encounter for immunization: Secondary | ICD-10-CM

## 2018-12-18 DIAGNOSIS — N951 Menopausal and female climacteric states: Secondary | ICD-10-CM

## 2018-12-18 DIAGNOSIS — Z780 Asymptomatic menopausal state: Secondary | ICD-10-CM

## 2018-12-18 MED ORDER — ESTROGENS, CONJUGATED 0.625 MG/GM VA CREA: 1.0000 | TOPICAL_CREAM | Freq: Every day | VAGINAL | 12 refills | Status: DC

## 2018-12-18 MED ORDER — SYNTHROID 75 MCG PO TABS: 75.0000 ug | ORAL_TABLET | Freq: Every day | ORAL | 2 refills | Status: DC

## 2018-12-18 NOTE — Progress Notes (Signed)
Name: Miranda Stafford   MRN: [id number]    DOB: 20-Sep-1956   Date:12/18/2018       Progress Note  Subjective  Chief Complaint  Chief Complaint  Patient presents with  . Annual Exam    HPI   Patient presents for annual CPE - she does not want to discuss any other problems,   Diet: balanced, cooks at home Exercise: not very physically active now  USPSTF grade A and B recommendations   Depression:  Depression screen [facility] 2/9 12/18/2018 09/06/2018 05/26/2018 01/10/2018 08/03/2017  Decreased Interest 0 0 0 0 0  Down, Depressed, Hopeless 0 0 0 0 0  PHQ - 2 Score 0 0 0 0 0  Altered sleeping - 0 0 0 -  Tired, decreased energy - 0 0 0 -  Change in appetite - 0 0 0 -  Feeling bad or failure about yourself  - 0 0 0 -  Trouble concentrating - 0 0 0 -  Moving slowly or fidgety/restless - 0 0 0 -  Suicidal thoughts - 0 0 0 -  PHQ-9 Score - 0 0 0 -  Difficult doing work/chores - Not difficult at all - Not difficult at all -   Hypertension: BP Readings from Last 3 Encounters:  12/18/18 108/64  10/24/18 120/66  10/17/18 123/82   Obesity: Wt Readings from Last 3 Encounters:  12/18/18 121 lb 9.6 oz (55.2 kg)  10/24/18 119 lb 4.8 oz (54.1 kg)  10/17/18 110 lb (49.9 kg)   BMI Readings from Last 3 Encounters:  12/18/18 21.54 kg/m  10/24/18 21.13 kg/m  10/17/18 19.49 kg/m    Hep C Screening: up to date  STD testing and prevention (HIV/chl/gon/syphilis): N/A Intimate partner violence: negative screen  Sexual History/Pain during Intercourse: she states painful, usually with initial penetration using lubricants , she states very dry, she would like to try topical medication  Menstrual History/LMP/Abnormal Bleeding: discussed need of follow up if any post-menopausal bleeding  Incontinence Symptoms: no problems  Advanced Care Planning: A voluntary discussion about advance care planning including the explanation and discussion of advance directives.  Discussed health care proxy and Living  will, and the patient was able to identify a health care proxy as husband   Patient does not have a living will at present time.   Breast cancer:  HM Mammogram  Date Value Ref Range Status  02/03/2011 normal  Final    BRCA gene screening: N/A Cervical cancer screening: not due until next visit   Osteoporosis Screening: she will check coverage with insurance   Lipids:  Lab Results  Component Value Date   CHOL 283 (H) 09/06/2018   CHOL 273 (H) 01/10/2018   CHOL 290 (H) 12/30/2016   Lab Results  Component Value Date   HDL 83 09/06/2018   HDL 76 01/10/2018   HDL 94 12/30/2016   Lab Results  Component Value Date   LDLCALC 173 (H) 09/06/2018   LDLCALC 165 (H) 01/10/2018   LDLCALC 181 (H) 12/30/2016   Lab Results  Component Value Date   TRIG 132 09/06/2018   TRIG 171 (H) 01/10/2018   TRIG 74 12/30/2016   Lab Results  Component Value Date   CHOLHDL 3.4 09/06/2018   CHOLHDL 3.6 01/10/2018   CHOLHDL 3.1 12/30/2016   No results found for: LDLDIRECT  Glucose:  Glucose, Bld  Date Value Ref Range Status  10/17/2018 97 70 - 99 mg/dL Final  01/10/2018 79 65 - 139 mg/dL Final    Comment:    .  Non-fasting reference interval .   02/10/2017 82 65 - 99 mg/dL Final    Skin cancer: discussed atypical lesions  Colorectal cancer: up to date  Lung cancer:  Low Dose CT Chest recommended if Age 52-80 years, 30 pack-year currently smoking OR have quit w/in 15years. Patient does not qualify.   ECG:10/2018  Patient Active Problem List   Diagnosis Date Noted  . Other neutropenia ([place]) 01/10/2018  . Allergic rhinitis due to allergen 08/03/2017  . B12 deficiency 02/25/2017  . Family history of breast cancer in sister 05/24/2016  . Hypothyroidism 09/19/2015  . Asthma, moderate persistent, poorly-controlled 09/19/2015  . Perennial allergic rhinitis with seasonal variation 09/19/2015  . Major depression in remission ([place]) 09/19/2015    Past Surgical History:  Procedure  Laterality Date  . ABDOMINAL HYSTERECTOMY  2005   fibroid tumor  . ADENOIDECTOMY    . CESAREAN SECTION    . TUBAL LIGATION      Family History  Problem Relation Age of Onset  . Heart disease Mother   . Hypertension Mother   . Hyperlipidemia Mother   . SIDS Son   . Breast cancer Sister 71  . Hypothyroidism Sister   . Hypertension Sister     Social History   Socioeconomic History  . Marital status: Married    Spouse name: Abe People  . Number of children: 2  . Years of education: Not on file  . Highest education level: Professional school degree (e.g., MD, DDS, DVM, JD)  Occupational History  . Not on file  Social Needs  . Financial resource strain: Not hard at all  . Food insecurity:    Worry: Never true    Inability: Never true  . Transportation needs:    Medical: No    Non-medical: No  Tobacco Use  . Smoking status: Former Smoker    Packs/day: 0.50    Years: 5.00    Pack years: 2.50    Types: Cigarettes    Start date: 11/16/1979    Last attempt to quit: 09/18/1985    Years since quitting: 33.2  . Smokeless tobacco: Never Used  Substance and Sexual Activity  . Alcohol use: Yes    Alcohol/week: 0.0 standard drinks    Comment: wine/socially/rarely  . Drug use: No  . Sexual activity: Yes    Partners: Male  Lifestyle  . Physical activity:    Days per week: 0 days    Minutes per session: 0 min  . Stress: Not at all  Relationships  . Social connections:    Talks on phone: More than three times a week    Gets together: Three times a week    Attends religious service: More than 4 times per year    Active member of club or organization: No    Attends meetings of clubs or organizations: Never    Relationship status: Married  . Intimate partner violence:    Fear of current or ex partner: No    Emotionally abused: No    Physically abused: No    Forced sexual activity: No  Other Topics Concern  . Not on file  Social History Narrative  . Not on file      Current Outpatient Medications:  .  albuterol (PROVENTIL HFA;VENTOLIN HFA) 108 (90 Base) MCG/ACT inhaler, INHALE 2 PUFFS INTO THE LUNGS EVERY 6 HOURS AS NEEDED FOR WHEEZING OR SHORTNESS OF BREATH, Disp: 6.7 g, Rfl: 0 .  conjugated estrogens (PREMARIN) vaginal cream, Place 1 Applicatorful vaginally daily., Disp:  42.5 g, Rfl: 12 .  cyclobenzaprine (FLEXERIL) 5 MG tablet, Take 1 tablet (5 mg total) by mouth at bedtime., Disp: 30 tablet, Rfl: 0 .  fluticasone (FLONASE) 50 MCG/ACT nasal spray, Place 2 sprays into both nostrils daily., Disp: , Rfl:  .  fluticasone furoate-vilanterol (BREO ELLIPTA) 100-25 MCG/INH AEPB, Inhale 1 puff into the lungs daily., Disp: 60 each, Rfl: 1 .  montelukast (SINGULAIR) 10 MG tablet, Take 1 tablet (10 mg total) by mouth at bedtime., Disp: 30 tablet, Rfl: 5 .  SYNTHROID 75 MCG tablet, Take 1 tablet (75 mcg total) by mouth daily before breakfast. M-F, Disp: 30 tablet, Rfl: 2 .  Vitamin D, Ergocalciferol, (DRISDOL) 1.25 MG (50000 UT) CAPS capsule, TAKE 1 CAPSULE BY MOUTH EVERY 7 DAYS, Disp: 12 capsule, Rfl: 0  Current Facility-Administered Medications:  .  cyanocobalamin ((VITAMIN B-12)) injection 1,000 mcg, 1,000 mcg, Subcutaneous, Q30 days, Steele Sizer, MD, 1,000 mcg at 12/13/18 0851  No Known Allergies   ROS  Constitutional: Negative for fever or weight change.  Respiratory: Negative for cough and shortness of breath.  She has severe nasal congestion, out of singular, pulse ox improved when she started to breath through her mouth, discussed treating her today again, but she wants to make is a well visit only and will return if needed.  Cardiovascular: Negative for chest pain or palpitations.  Gastrointestinal: Negative for abdominal pain, no bowel changes.  Musculoskeletal: Negative for gait problem or joint swelling.  Skin: Negative for rash.  Neurological: Negative for dizziness or headache.  No other specific complaints in a complete review of  systems (except as listed in HPI above).  Objective  Vitals:   12/18/18 1452 12/18/18 1502  BP: 108/64   Pulse: 72 76  Resp: 16   Temp: 98.4 F (36.9 C)   TempSrc: Oral   SpO2: (!) 86% 93%  Weight: 121 lb 9.6 oz (55.2 kg)   Height: '5\' 3"'$  (1.6 m)     Body mass index is 21.54 kg/m.  Physical Exam  Constitutional: Patient appears well-developed and well-nourished. No distress.  HENT: Head: Normocephalic and atraumatic. Ears: B TMs ok, no erythema or effusion; Nose: boggy turbinates,. Mouth/Throat: Oropharynx is clear and moist. No oropharyngeal exudate.  Eyes: Conjunctivae and EOM are normal. Pupils are equal, round, and reactive to light. No scleral icterus.  Neck: Normal range of motion. Neck supple. No JVD present. No thyromegaly present.  Cardiovascular: Normal rate, regular rhythm and normal heart sounds.  No murmur heard. No BLE edema. Pulmonary/Chest: Effort normal and breath sounds normal. No respiratory distress. Abdominal: Soft. Bowel sounds are normal, no distension. There is no tenderness. no masses Breast: no lumps or masses, no nipple discharge or rashes FEMALE GENITALIA:  Not done  RECTAL: not done  Musculoskeletal: Normal range of motion, no joint effusions. No gross deformities Neurological: he is alert and oriented to person, place, and time. No cranial nerve deficit. Coordination, balance, strength, speech and gait are normal.  Skin: Skin is warm and dry. No rash noted. No erythema.  Psychiatric: Patient has a normal mood and affect. behavior is normal. Judgment and thought content normal.   Recent Results (from the past 2160 hour(s))  CBC     Status: Abnormal   Collection Time: 10/17/18  8:38 AM  Result Value Ref Range   WBC 3.8 (L) 4.0 - 10.5 K/uL   RBC 4.15 3.87 - 5.11 MIL/uL   Hemoglobin 10.9 (L) 12.0 - 15.0 g/dL   HCT 35.2 (L)  36.0 - 46.0 %   MCV 84.8 80.0 - 100.0 fL   MCH 26.3 26.0 - 34.0 pg   MCHC 31.0 30.0 - 36.0 g/dL   RDW 14.3 11.5 - 15.5 %    Platelets 197 150 - 400 K/uL   nRBC 0.0 0.0 - 0.2 %    Comment: Performed at [facility], [address] [place], [place] [address]  Comprehensive metabolic panel     Status: None   Collection Time: 10/17/18  8:38 AM  Result Value Ref Range   Sodium 140 135 - 145 mmol/L   Potassium 3.7 3.5 - 5.1 mmol/L   Chloride 104 98 - 111 mmol/L   CO2 26 22 - 32 mmol/L   Glucose, Bld 97 70 - 99 mg/dL   BUN 13 6 - 20 mg/dL   Creatinine, Ser 0.82 0.44 - 1.00 mg/dL   Calcium 9.3 8.9 - 10.3 mg/dL   Total Protein 7.7 6.5 - 8.1 g/dL   Albumin 4.4 3.5 - 5.0 g/dL   AST 39 15 - 41 U/L   ALT 23 0 - 44 U/L   Alkaline Phosphatase 46 38 - 126 U/L   Total Bilirubin 0.5 0.3 - 1.2 mg/dL   GFR calc non Af Amer >60 >60 mL/min   GFR calc Af Amer >60 >60 mL/min   Anion gap 10 5 - 15    Comment: Performed at [facility], [address] [place], [place] [address]  Troponin I - ONCE - STAT     Status: None   Collection Time: 10/17/18  8:38 AM  Result Value Ref Range   Troponin I <0.03 <0.03 ng/mL    Comment: Performed at [facility], [address] [place], [place] [address]  CBC with Differential/Platelet     Status: Abnormal   Collection Time: 12/13/18  8:29 AM  Result Value Ref Range   WBC 3.8 3.8 - 10.8 Thousand/uL   RBC 3.86 3.80 - 5.10 Million/uL   Hemoglobin 10.3 (L) 11.7 - 15.5 g/dL   HCT 32.3 (L) 35.0 - 45.0 %   MCV 83.7 80.0 - 100.0 fL   MCH 26.7 (L) 27.0 - 33.0 pg   MCHC 31.9 (L) 32.0 - 36.0 g/dL   RDW 13.6 11.0 - 15.0 %   Platelets 179 140 - 400 Thousand/uL   MPV 11.6 7.5 - 12.5 fL   Neutro Abs 1,129 (L) 1,500 - 7,800 cells/uL   Lymphs Abs 1,702 850 - 3,900 cells/uL   Absolute Monocytes 296 200 - 950 cells/uL   Eosinophils Absolute 635 (H) 15 - 500 cells/uL   Basophils Absolute 38 0 - 200 cells/uL   Neutrophils Relative % 29.7 %   Total Lymphocyte 44.8 %   Monocytes Relative 7.8 %   Eosinophils Relative 16.7 %   Basophils Relative 1.0 %  TSH      Status: Abnormal   Collection Time: 12/13/18  8:29 AM  Result Value Ref Range   TSH 4.57 (H) 0.40 - 4.50 mIU/L     PHQ2/9: Depression screen [facility] 2/9 12/18/2018 09/06/2018 05/26/2018 01/10/2018 08/03/2017  Decreased Interest 0 0 0 0 0  Down, Depressed, Hopeless 0 0 0 0 0  PHQ - 2 Score 0 0 0 0 0  Altered sleeping - 0 0 0 -  Tired, decreased energy - 0 0 0 -  Change in appetite - 0 0 0 -  Feeling bad or failure about yourself  - 0 0 0 -  Trouble concentrating -  0 0 0 -  Moving slowly or fidgety/restless - 0 0 0 -  Suicidal thoughts - 0 0 0 -  PHQ-9 Score - 0 0 0 -  Difficult doing work/chores - Not difficult at all - Not difficult at all -     Fall Risk: Fall Risk  10/24/2018 09/06/2018 05/26/2018 01/10/2018 08/03/2017  Falls in the past year? 0 No No No Yes  Number falls in past yr: - - - - 1  Injury with Fall? - - - - No     Functional Status Survey: Is the patient deaf or have difficulty hearing?: No Does the patient have difficulty seeing, even when wearing glasses/contacts?: Yes Does the patient have difficulty concentrating, remembering, or making decisions?: No Does the patient have difficulty walking or climbing stairs?: No Does the patient have difficulty dressing or bathing?: No Does the patient have difficulty doing errands alone such as visiting a doctor's office or shopping?: No   Assessment & Plan  1. Well woman exam  - MM Digital Screening; Future  2. Need for immunization against influenza  Patient refused   3. Breast cancer screening  - MM Digital Screening; Future  4. Osteoporosis screening  Needs bone density  5. Post-menopausal   6. Hypothyroidism, adult  Continue current dose of levothyroxine M-F since she went back to 50 mcg when she ran out of correct dose  7. Vaginal dryness, menopausal  - conjugated estrogens (PREMARIN) vaginal cream; Place 1 Applicatorful vaginally daily.  Dispense: 42.5 g; Refill: 12  -USPSTF grade A and B  recommendations reviewed with patient; age-appropriate recommendations, preventive care, screening tests, etc discussed and encouraged; healthy living encouraged; see AVS for patient education given to patient -Discussed importance of 150 minutes of physical activity weekly, eat two servings of fish weekly, eat one serving of tree nuts ( cashews, pistachios, pecans, almonds.Marland Kitchen) every other day, eat 6 servings of fruit/vegetables daily and drink plenty of water and avoid sweet beverages.

## 2018-12-18 NOTE — Patient Instructions (Signed)
Preventive Care 40-64 Years, Female Preventive care refers to lifestyle choices and visits with your health care provider that can promote health and wellness. What does preventive care include?   A yearly physical exam. This is also called an annual well check.  Dental exams once or twice a year.  Routine eye exams. Ask your health care provider how often you should have your eyes checked.  Personal lifestyle choices, including: ? Daily care of your teeth and gums. ? Regular physical activity. ? Eating a healthy diet. ? Avoiding tobacco and drug use. ? Limiting alcohol use. ? Practicing safe sex. ? Taking low-dose aspirin daily starting at age 50. ? Taking vitamin and mineral supplements as recommended by your health care provider. What happens during an annual well check? The services and screenings done by your health care provider during your annual well check will depend on your age, overall health, lifestyle risk factors, and family history of disease. Counseling Your health care provider may ask you questions about your:  Alcohol use.  Tobacco use.  Drug use.  Emotional well-being.  Home and relationship well-being.  Sexual activity.  Eating habits.  Work and work environment.  Method of birth control.  Menstrual cycle.  Pregnancy history. Screening You may have the following tests or measurements:  Height, weight, and BMI.  Blood pressure.  Lipid and cholesterol levels. These may be checked every 5 years, or more frequently if you are over 50 years old.  Skin check.  Lung cancer screening. You may have this screening every year starting at age 55 if you have a 30-pack-year history of smoking and currently smoke or have quit within the past 15 years.  Colorectal cancer screening. All adults should have this screening starting at age 50 and continuing until age 75. Your health care provider may recommend screening at age 45. You will have tests every  1-10 years, depending on your results and the type of screening test. People at increased risk should start screening at an earlier age. Screening tests may include: ? Guaiac-based fecal occult blood testing. ? Fecal immunochemical test (FIT). ? Stool DNA test. ? Virtual colonoscopy. ? Sigmoidoscopy. During this test, a flexible tube with a tiny camera (sigmoidoscope) is used to examine your rectum and lower colon. The sigmoidoscope is inserted through your anus into your rectum and lower colon. ? Colonoscopy. During this test, a long, thin, flexible tube with a tiny camera (colonoscope) is used to examine your entire colon and rectum.  Hepatitis C blood test.  Hepatitis B blood test.  Sexually transmitted disease (STD) testing.  Diabetes screening. This is done by checking your blood sugar (glucose) after you have not eaten for a while (fasting). You may have this done every 1-3 years.  Mammogram. This may be done every 1-2 years. Talk to your health care provider about when you should start having regular mammograms. This may depend on whether you have a family history of breast cancer.  BRCA-related cancer screening. This may be done if you have a family history of breast, ovarian, tubal, or peritoneal cancers.  Pelvic exam and Pap test. This may be done every 3 years starting at age 21. Starting at age 30, this may be done every 5 years if you have a Pap test in combination with an HPV test.  Bone density scan. This is done to screen for osteoporosis. You may have this scan if you are at high risk for osteoporosis. Discuss your test results, treatment options,   and if necessary, the need for more tests with your health care provider. Vaccines Your health care provider may recommend certain vaccines, such as:  Influenza vaccine. This is recommended every year.  Tetanus, diphtheria, and acellular pertussis (Tdap, Td) vaccine. You may need a Td booster every 10 years.  Varicella  vaccine. You may need this if you have not been vaccinated.  Zoster vaccine. You may need this after age 38.  Measles, mumps, and rubella (MMR) vaccine. You may need at least one dose of MMR if you were born in 1957 or later. You may also need a second dose.  Pneumococcal 13-valent conjugate (PCV13) vaccine. You may need this if you have certain conditions and were not previously vaccinated.  Pneumococcal polysaccharide (PPSV23) vaccine. You may need one or two doses if you smoke cigarettes or if you have certain conditions.  Meningococcal vaccine. You may need this if you have certain conditions.  Hepatitis A vaccine. You may need this if you have certain conditions or if you travel or work in places where you may be exposed to hepatitis A.  Hepatitis B vaccine. You may need this if you have certain conditions or if you travel or work in places where you may be exposed to hepatitis B.  Haemophilus influenzae type b (Hib) vaccine. You may need this if you have certain conditions. Talk to your health care provider about which screenings and vaccines you need and how often you need them. This information is not intended to replace advice given to you by your health care provider. Make sure you discuss any questions you have with your health care provider. Document Released: 11/28/2015 Document Revised: 12/22/2017 Document Reviewed: 09/02/2015 Elsevier Interactive Patient Education  2019 [employer].

## 2019-01-06 ENCOUNTER — Other Ambulatory Visit: Payer: Self-pay | Admitting: Family Medicine

## 2019-01-06 DIAGNOSIS — J45901 Unspecified asthma with (acute) exacerbation: Secondary | ICD-10-CM

## 2019-01-07 NOTE — Telephone Encounter (Signed)
She needs to come in ASAP if still feeling sick, I may need to order a CT chest.

## 2019-01-08 ENCOUNTER — Other Ambulatory Visit: Payer: Self-pay | Admitting: Family Medicine

## 2019-01-08 DIAGNOSIS — J3089 Other allergic rhinitis: Secondary | ICD-10-CM

## 2019-01-08 DIAGNOSIS — J339 Nasal polyp, unspecified: Secondary | ICD-10-CM

## 2019-01-08 MED ORDER — AZELASTINE HCL 0.1 % NA SOLN: 2.0000 | Freq: Two times a day (BID) | NASAL | 2 refills | Status: DC

## 2019-01-08 NOTE — Telephone Encounter (Signed)
Copied from [place] [id number]. Topic: Quick Communication - See Telephone Encounter >> Jan 08, 2019 11:32 AM Loma [place] wrote: CRM for notification. See Telephone encounter for: 01/08/19. PT is calling wanted to speak to Maurice. She wants a refill of Prednisone and I do not see it in her chart. Please return call to [phone]

## 2019-01-08 NOTE — Telephone Encounter (Signed)
Spoke to patient, she states not sob, but has severe nasal congestion and prednisone helps. Advised her to go back to ENT , she has a nasal polyp, we will add astelin in the mean time

## 2019-01-27 IMAGING — US US THYROID
1 series · 13 of 25 positions shown · non-contrast
Comparison: None.

CLINICAL DATA: Neck swelling, neutropenia. Synthroid times 25
years.

EXAM:
THYROID ULTRASOUND
TECHNIQUE: Ultrasound examination of the thyroid gland and adjacent soft
tissues was performed.

[Series 1: us thyroid · 0.07mm/px · 13 of 44 slices shown]
[im 1/44]
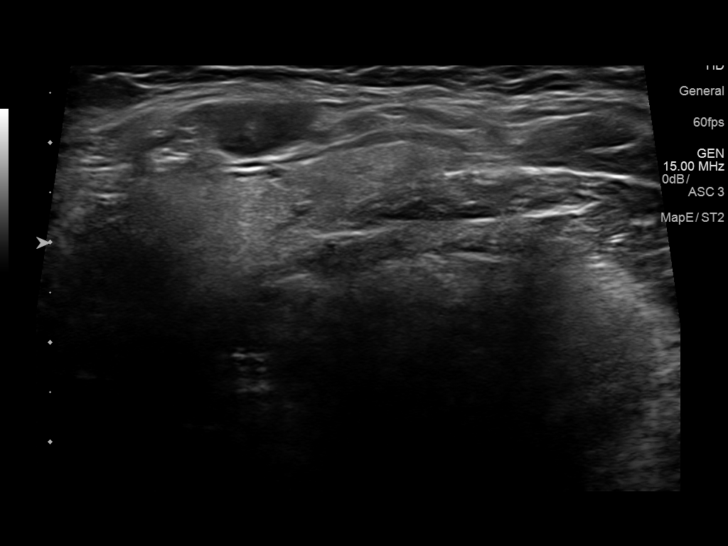
[im 4/44]
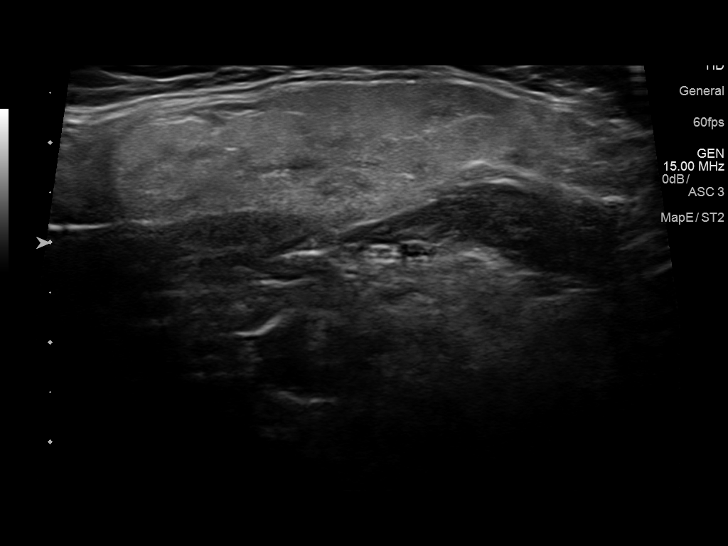
[im 8/44]
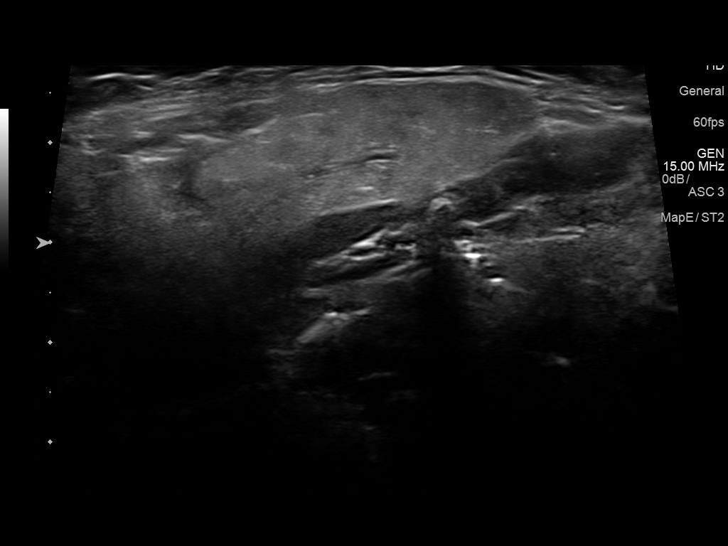
[im 11/44]
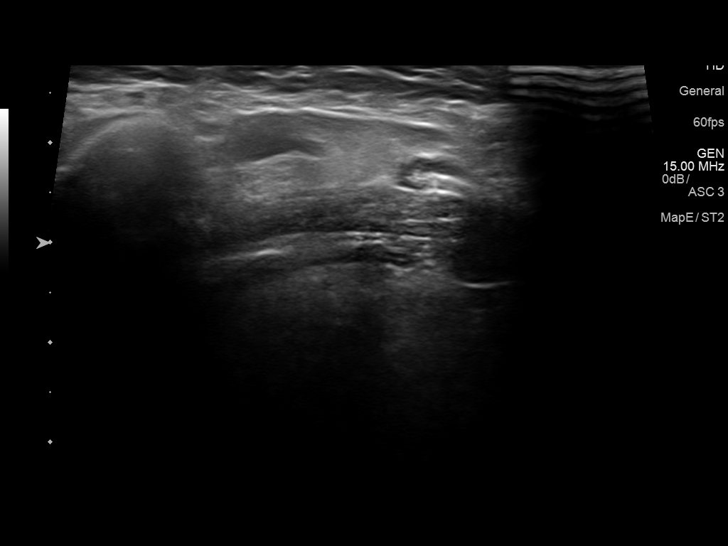
[im 15/44]
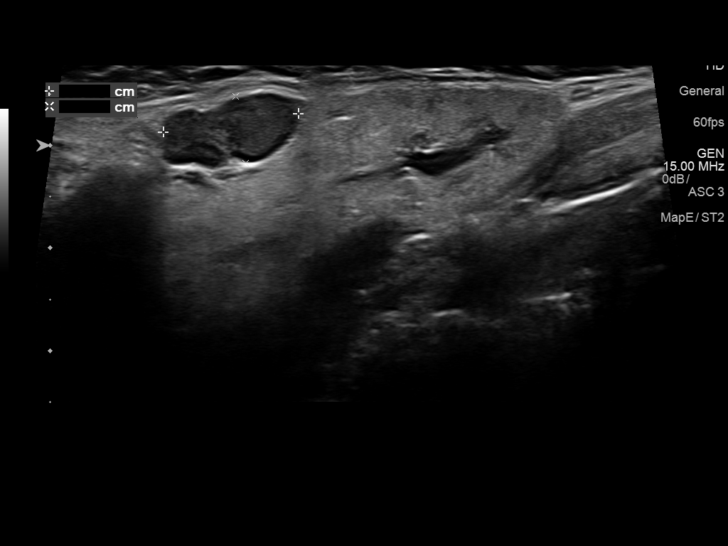
[im 18/44]
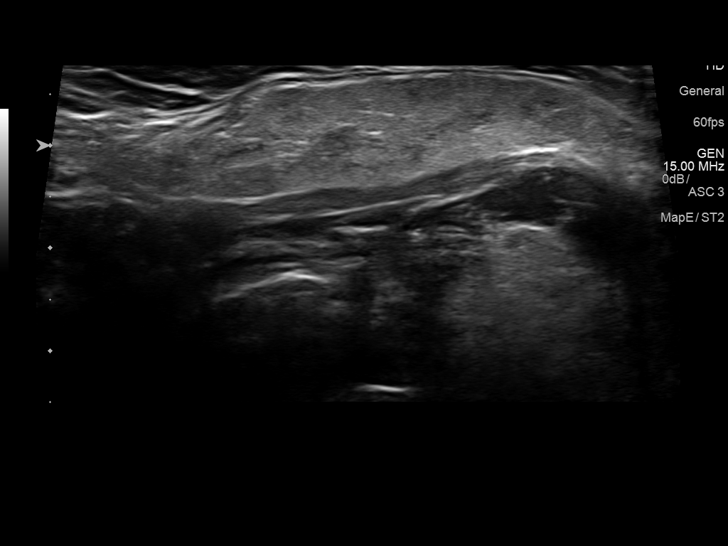
[im 22/44]
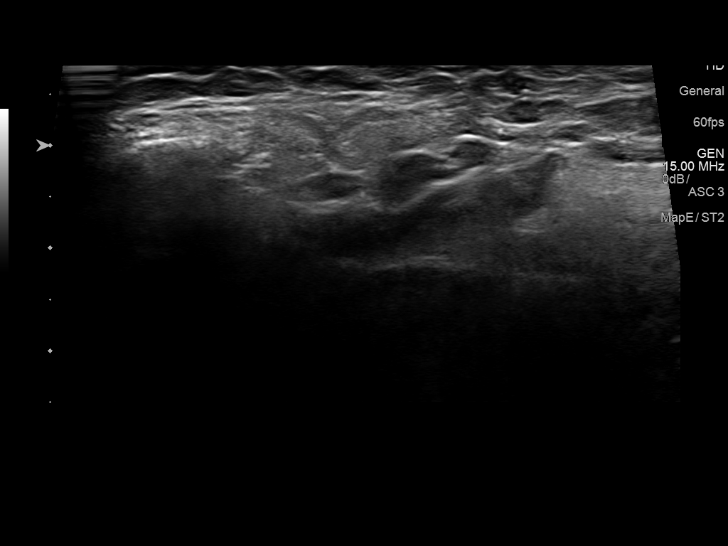
[im 26/44]
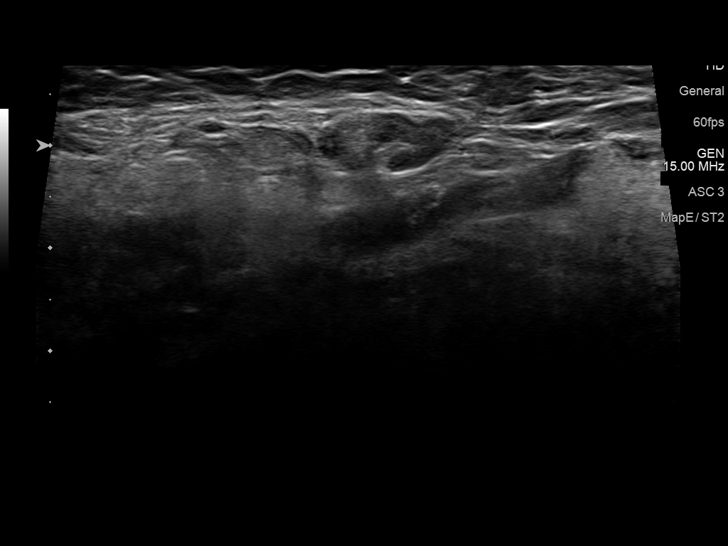
[im 29/44]
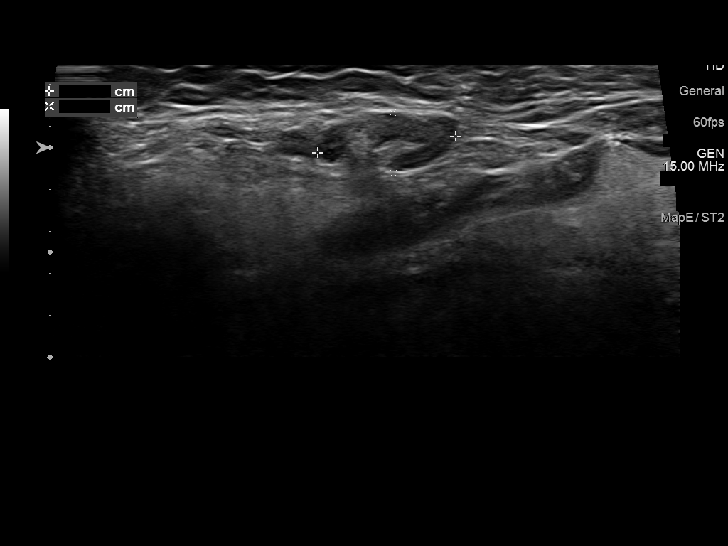
[im 33/44]
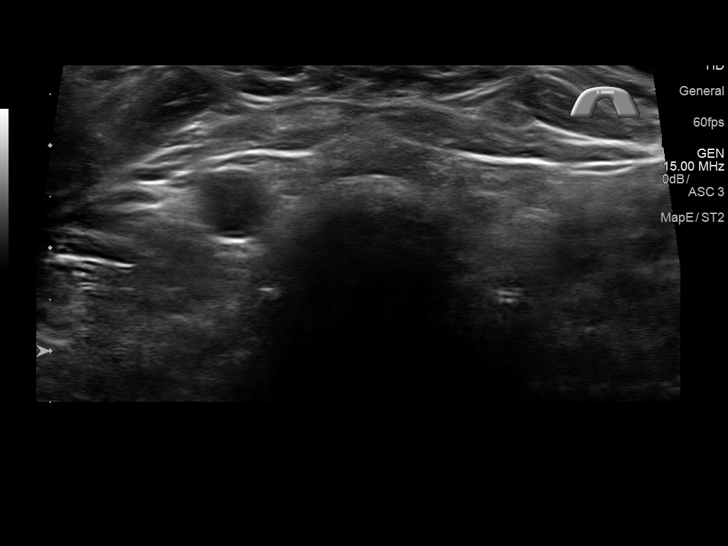
[im 36/44]
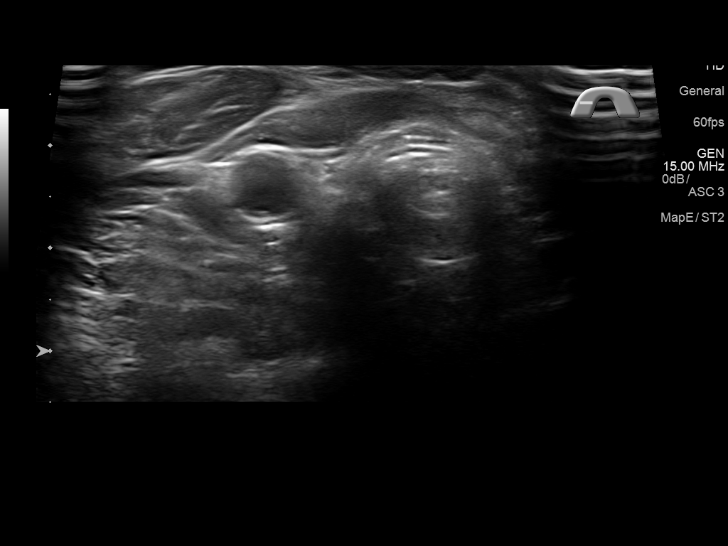
[im 40/44]
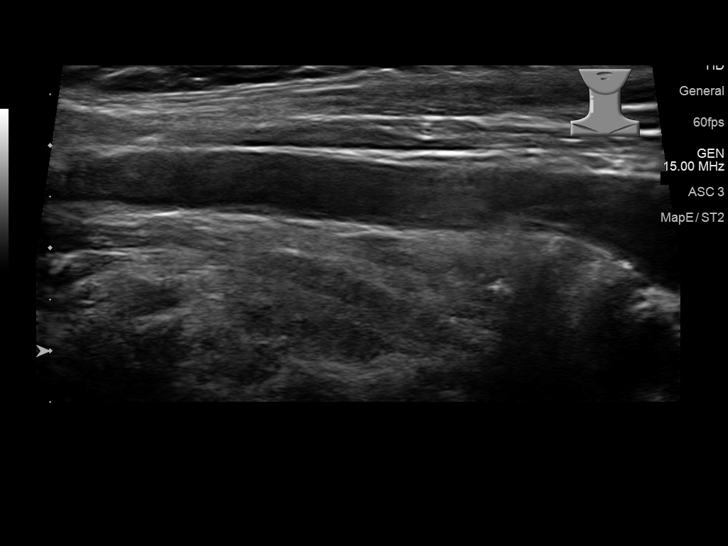
[im 44/44]
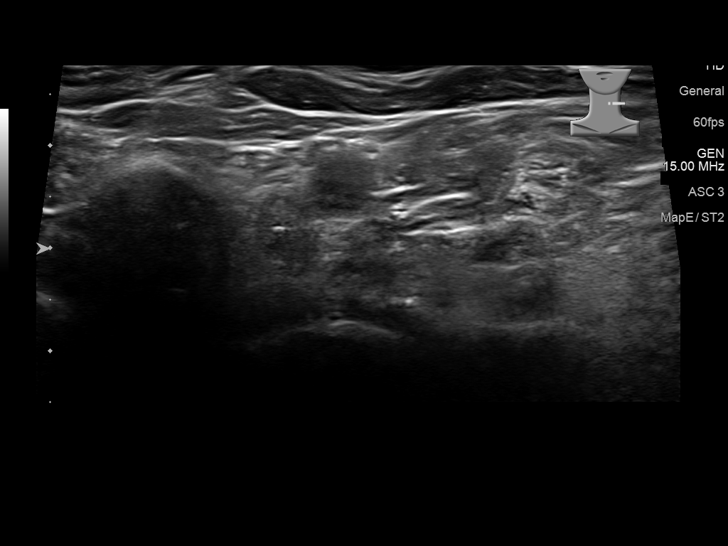

[13 of 25 positions shown; findings below may reference images not displayed]

FINDINGS: Parenchymal Echotexture: Not applicable

Isthmus: Not visualized

Right lobe: No significant thyroid tissue identified

Left lobe: No significant thyroid tissue identified.

_________________________________________________________

Estimated total number of nodules >/= 1 cm: 0

Number of spongiform nodules >/=  2 cm not described below (TR1): 0

Number of mixed cystic and solid nodules >/= 1.5 cm not described
below (TR2): 0

_________________________________________________________

No discrete nodules are seen within the expected region of thyroid
gland.

Morphologically unremarkable Right submandibular lymph nodes are
noted measured up to 7 mm short axis diameter. Contralateral left
submandibular lymph nodes measure up to 6 mm short axis diameter.
IMPRESSION: 1. No visualized  thyroid tissue, nodule or mass.
2. Prominent bilateral submandibular lymph nodes.

The above is in keeping with the ACR TI-RADS recommendations - [HOSPITAL] 4728;[DATE].

## 2019-02-07 ENCOUNTER — Other Ambulatory Visit: Payer: Self-pay | Admitting: Family Medicine

## 2019-02-07 DIAGNOSIS — J3089 Other allergic rhinitis: Secondary | ICD-10-CM

## 2019-03-07 ENCOUNTER — Encounter: Payer: Self-pay | Admitting: Family Medicine

## 2019-03-07 ENCOUNTER — Other Ambulatory Visit: Payer: Self-pay

## 2019-03-07 ENCOUNTER — Ambulatory Visit (INDEPENDENT_AMBULATORY_CARE_PROVIDER_SITE_OTHER): Payer: BC Managed Care – PPO | Admitting: Family Medicine

## 2019-03-07 DIAGNOSIS — E559 Vitamin D deficiency, unspecified: Secondary | ICD-10-CM

## 2019-03-07 DIAGNOSIS — Z780 Asymptomatic menopausal state: Secondary | ICD-10-CM | POA: Diagnosis not present

## 2019-03-07 DIAGNOSIS — J4520 Mild intermittent asthma, uncomplicated: Secondary | ICD-10-CM

## 2019-03-07 DIAGNOSIS — E538 Deficiency of other specified B group vitamins: Secondary | ICD-10-CM

## 2019-03-07 DIAGNOSIS — R7303 Prediabetes: Secondary | ICD-10-CM | POA: Diagnosis not present

## 2019-03-07 DIAGNOSIS — D708 Other neutropenia: Secondary | ICD-10-CM

## 2019-03-07 DIAGNOSIS — F325 Major depressive disorder, single episode, in full remission: Secondary | ICD-10-CM | POA: Diagnosis not present

## 2019-03-07 DIAGNOSIS — E038 Other specified hypothyroidism: Secondary | ICD-10-CM

## 2019-03-07 DIAGNOSIS — E785 Hyperlipidemia, unspecified: Secondary | ICD-10-CM

## 2019-03-07 NOTE — Progress Notes (Signed)
Name: Miranda Stafford   MRN: [id number]    DOB: [birth year]   Date:03/07/2019       Progress Note  Subjective  Chief Complaint  Chief Complaint  Patient presents with  . Follow-up    I connected with  Miranda Stafford  on 03/07/19 at  1:40 PM EDT by a video enabled telemedicine application and verified that I am speaking with the correct person using two identifiers.  I discussed the limitations of evaluation and management by telemedicine and the availability of in person appointments. The patient expressed understanding and agreed to proceed. Staff also discussed with the patient that there may be a patient responsible charge related to this service. Patient Location: home  Provider Location: [facility]   HPI  Asthma Mild Intermittent : shehad a sever flare back in Dec 2019, she came in and we re-started her on Breo and gave her a round of prednisone, she has stopped medication since. She states at this time she is doing well, no cough, wheezing or chest tightness  Hypothyroidism:she has been taking medication on a regular basis, 75 mcg M-F but not on weekends , she prefers brand Synthroid. Her last TSH was slightly above normal but she was out of medication so we continue current dose of medication. She denies palpitation or change in bowel movements at this time  Hematological problems: she was seen by  Dr. Janese Banks in the past  for anemia and neutropenia, found to have lymphadenopathy and was referral to ENT, but was given reassurance, we are monitoring here now. Getting B12 monthly at our office.  She also has mild anemia, colonoscopy up to date.   AR: still has poor sense of smell, seen by Dr. Cathlean Sauer, on flonase now, still on singulair . She stopped Xyzal because it was not working. Still has nasal polyps and daily nasal congestion but does not want to have surgery at this time because the CT was going to cost too much.   Menopausal Symptoms:she is not as fatigued at  this time but still has night sweats and host flashes, she does not want to take medication for it. Unchanged   Major Depression : it happened back in 2015 and lasted for about one year  , she was worried about her grandson, work wasn't great also problems with her dissertation, but doing since 2016 she has been doing well. Phq 9 negative and we will continue to monitor   Dyslipidemia: last LDL was 173, she is not on statin therapy, we will recheck labs on her next visit, since TSH was abnormal at the time  The 10-year ASCVD risk score Mikey Bussing DC Brooke Bonito., et al., 2013) is: 3.5%   Values used to calculate the score:     Age: 60 years     Sex: Female     Is Non-Hispanic African American: Yes     Diabetic: No     Tobacco smoker: No     Systolic Blood Pressure: 196 mmHg     Is BP treated: No     HDL Cholesterol: 83 mg/dL     Total Cholesterol: 283 mg/dL  Patient Active Problem List   Diagnosis Date Noted  . Other neutropenia ([place]) 01/10/2018  . Allergic rhinitis due to allergen 08/03/2017  . B12 deficiency 02/25/2017  . Family history of breast cancer in sister 05/24/2016  . Hypothyroidism 09/19/2015  . Asthma, moderate persistent, poorly-controlled 09/19/2015  . Perennial allergic rhinitis with seasonal variation 09/19/2015  . Major  depression in remission ([place]) 09/19/2015    Past Surgical History:  Procedure Laterality Date  . ABDOMINAL HYSTERECTOMY  2005   fibroid tumor  . ADENOIDECTOMY    . CESAREAN SECTION    . TUBAL LIGATION      Family History  Problem Relation Age of Onset  . Heart disease Mother   . Hypertension Mother   . Hyperlipidemia Mother   . SIDS Son   . Breast cancer Sister 67  . Hypothyroidism Sister   . Hypertension Sister     Social History   Socioeconomic History  . Marital status: Married    Spouse name: Abe People  . Number of children: 2  . Years of education: Not on file  . Highest education level: Professional school degree (e.g., MD, DDS, DVM, JD)   Occupational History  . Occupation: Retired Tour manager  . Financial resource strain: Not hard at all  . Food insecurity:    Worry: Never true    Inability: Never true  . Transportation needs:    Medical: No    Non-medical: No  Tobacco Use  . Smoking status: Former Smoker    Packs/day: 0.50    Years: 5.00    Pack years: 2.50    Types: Cigarettes    Start date: 11/16/1979    Last attempt to quit: 09/18/1985    Years since quitting: 33.4  . Smokeless tobacco: Never Used  Substance and Sexual Activity  . Alcohol use: Yes    Alcohol/week: 0.0 standard drinks    Comment: wine/socially/rarely  . Drug use: No  . Sexual activity: Yes    Partners: Male  Lifestyle  . Physical activity:    Days per week: 0 days    Minutes per session: 0 min  . Stress: Not at all  Relationships  . Social connections:    Talks on phone: More than three times a week    Gets together: Three times a week    Attends religious service: More than 4 times per year    Active member of club or organization: No    Attends meetings of clubs or organizations: Never    Relationship status: Married  . Intimate partner violence:    Fear of current or ex partner: No    Emotionally abused: No    Physically abused: No    Forced sexual activity: No  Other Topics Concern  . Not on file  Social History Narrative  . Not on file     Current Outpatient Medications:  .  albuterol (PROVENTIL HFA;VENTOLIN HFA) 108 (90 Base) MCG/ACT inhaler, INHALE 2 PUFFS INTO THE LUNGS EVERY 6 HOURS AS NEEDED FOR WHEEZING OR SHORTNESS OF BREATH, Disp: 6.7 g, Rfl: 0 .  azelastine (ASTELIN) 0.1 % nasal spray, Place 2 sprays into both nostrils 2 (two) times daily. Use in each nostril as directed, Disp: 30 mL, Rfl: 2 .  conjugated estrogens (PREMARIN) vaginal cream, Place 1 Applicatorful vaginally daily., Disp: 42.5 g, Rfl: 12 .  cyclobenzaprine (FLEXERIL) 5 MG tablet, Take 1 tablet (5 mg total) by mouth at bedtime., Disp: 30  tablet, Rfl: 0 .  fluticasone (FLONASE) 50 MCG/ACT nasal spray, Place 2 sprays into both nostrils daily., Disp: , Rfl:  .  fluticasone furoate-vilanterol (BREO ELLIPTA) 100-25 MCG/INH AEPB, Inhale 1 puff into the lungs daily., Disp: 60 each, Rfl: 1 .  montelukast (SINGULAIR) 10 MG tablet, Take 1 tablet (10 mg total) by mouth at bedtime., Disp: 30 tablet, Rfl: 5 .  SYNTHROID 75 MCG tablet, Take 1 tablet (75 mcg total) by mouth daily before breakfast. M-F, Disp: 30 tablet, Rfl: 2 .  Vitamin D, Ergocalciferol, (DRISDOL) 1.25 MG (50000 UT) CAPS capsule, TAKE 1 CAPSULE BY MOUTH EVERY 7 DAYS, Disp: 12 capsule, Rfl: 0  Current Facility-Administered Medications:  .  cyanocobalamin ((VITAMIN B-12)) injection 1,000 mcg, 1,000 mcg, Subcutaneous, Q30 days, Steele Sizer, MD, 1,000 mcg at 12/13/18 0851  No Known Allergies  I personally reviewed active problem list, medication list, allergies, family history, social history with the patient/caregiver today.   ROS  Ten systems reviewed and is negative except as mentioned in HPI   Objective  Virtual encounter, vitals not obtained.   Physical Exam  Awake, alert and oriented, in no distress, voice is nasally   PHQ2/9: Depression screen [facility] 2/9 03/07/2019 12/18/2018 09/06/2018 05/26/2018 01/10/2018  Decreased Interest 0 0 0 0 0  Down, Depressed, Hopeless 0 0 0 0 0  PHQ - 2 Score 0 0 0 0 0  Altered sleeping 0 - 0 0 0  Tired, decreased energy 0 - 0 0 0  Change in appetite 0 - 0 0 0  Feeling bad or failure about yourself  0 - 0 0 0  Trouble concentrating 0 - 0 0 0  Moving slowly or fidgety/restless 0 - 0 0 0  Suicidal thoughts 0 - 0 0 0  PHQ-9 Score 0 - 0 0 0  Difficult doing work/chores Not difficult at all - Not difficult at all - Not difficult at all   PHQ-2/9 Result is negative.    Fall Risk: Fall Risk  03/07/2019 10/24/2018 09/06/2018 05/26/2018 01/10/2018  Falls in the past year? 0 0 No No No  Number falls in past yr: 0 - - - -  Injury  with Fall? 0 - - - -     Assessment & Plan  1. Other neutropenia ([place])  Advised to keep follow up with Dr. Janese Banks  2. Major depression in remission [facility])  Doing well for many years now, we are still monitoring   3. Post-menopausal  Unchanged   4. Pre-diabetes  Recheck labs next visit, she eats healthy   5. Dyslipidemia  Recheck labs on her next visit, TSH at the time was elevated low ASCVD score, but if LDL remains above 160 we will discuss statin therapy   6. Mild intermittent asthma without complication  Doing well at this time  7. B12 deficiency  Getting supplements  8. Vitamin D deficiency  Advised to take vitamin D otc  9. Other specified hypothyroidism  Needs TSH, I will order labs to be done prior to her next visit   I discussed the assessment and treatment plan with the patient. The patient was provided an opportunity to ask questions and all were answered. The patient agreed with the plan and demonstrated an understanding of the instructions.  The patient was advised to call back or seek an in-person evaluation if the symptoms worsen or if the condition fails to improve as anticipated.  I provided 25 minutes of non-face-to-face time during this encounter.

## 2019-04-04 ENCOUNTER — Other Ambulatory Visit: Payer: Self-pay | Admitting: Family Medicine

## 2019-04-04 NOTE — Telephone Encounter (Signed)
Done labs up front printed

## 2019-04-05 ENCOUNTER — Ambulatory Visit: Payer: BC Managed Care – PPO

## 2019-04-10 ENCOUNTER — Encounter: Payer: Self-pay | Admitting: Family Medicine

## 2019-04-24 ENCOUNTER — Encounter: Payer: Self-pay | Admitting: Family Medicine

## 2019-04-24 ENCOUNTER — Ambulatory Visit (INDEPENDENT_AMBULATORY_CARE_PROVIDER_SITE_OTHER): Payer: BC Managed Care – PPO

## 2019-04-24 DIAGNOSIS — E538 Deficiency of other specified B group vitamins: Secondary | ICD-10-CM

## 2019-04-24 LAB — TSH: TSH: 6.06 mIU/L — ABNORMAL HIGH (ref 0.40–4.50)

## 2019-04-24 NOTE — Progress Notes (Signed)
Patient came in for her B-12 injection. She tolerated it well. NKDA.   

## 2019-04-25 ENCOUNTER — Other Ambulatory Visit: Payer: Self-pay

## 2019-04-25 ENCOUNTER — Encounter: Payer: Self-pay | Admitting: Family Medicine

## 2019-04-25 ENCOUNTER — Other Ambulatory Visit: Payer: Self-pay | Admitting: Family Medicine

## 2019-04-25 DIAGNOSIS — E039 Hypothyroidism, unspecified: Secondary | ICD-10-CM

## 2019-04-25 MED ORDER — SYNTHROID 88 MCG PO TABS: 88.0000 ug | ORAL_TABLET | Freq: Every day | ORAL | 1 refills | Status: DC

## 2019-04-25 MED ORDER — LEVOTHYROXINE SODIUM 88 MCG PO TABS: 88.0000 ug | ORAL_TABLET | Freq: Every day | ORAL | 1 refills | Status: DC

## 2019-04-25 NOTE — Addendum Note (Signed)
Addended by: Steele Sizer F on: 04/25/2019 10:17 AM   Modules accepted: Orders

## 2019-05-25 ENCOUNTER — Ambulatory Visit (INDEPENDENT_AMBULATORY_CARE_PROVIDER_SITE_OTHER): Payer: BC Managed Care – PPO

## 2019-05-25 DIAGNOSIS — E538 Deficiency of other specified B group vitamins: Secondary | ICD-10-CM

## 2019-05-25 NOTE — Progress Notes (Signed)
Patient came in for her B-12 injection. She tolerated it well. NKDA.   

## 2019-06-25 ENCOUNTER — Ambulatory Visit (INDEPENDENT_AMBULATORY_CARE_PROVIDER_SITE_OTHER): Payer: BC Managed Care – PPO

## 2019-06-25 ENCOUNTER — Other Ambulatory Visit: Payer: Self-pay

## 2019-06-25 DIAGNOSIS — E538 Deficiency of other specified B group vitamins: Secondary | ICD-10-CM

## 2019-06-25 NOTE — Progress Notes (Signed)
Patient came in for her B-12 injection. She tolerated it well. NKDA.   

## 2019-07-03 ENCOUNTER — Ambulatory Visit (INDEPENDENT_AMBULATORY_CARE_PROVIDER_SITE_OTHER): Payer: BC Managed Care – PPO | Admitting: Family Medicine

## 2019-07-03 ENCOUNTER — Other Ambulatory Visit: Payer: Self-pay

## 2019-07-03 ENCOUNTER — Encounter: Payer: Self-pay | Admitting: Family Medicine

## 2019-07-03 DIAGNOSIS — S161XXA Strain of muscle, fascia and tendon at neck level, initial encounter: Secondary | ICD-10-CM

## 2019-07-03 MED ORDER — CYCLOBENZAPRINE HCL 5 MG PO TABS: 5.0000 mg | ORAL_TABLET | Freq: Three times a day (TID) | ORAL | 0 refills | Status: DC | PRN

## 2019-07-03 MED ORDER — MELOXICAM 15 MG PO TABS: 15.0000 mg | ORAL_TABLET | Freq: Every day | ORAL | 0 refills | Status: DC

## 2019-07-03 NOTE — Progress Notes (Signed)
Name: Miranda Stafford   MRN: [id number]    DOB: 07/23/1956   Date:07/03/2019       Progress Note  Subjective  Chief Complaint  Chief Complaint  Patient presents with  . Arm Pain    right arm   . Back Pain  . Headache    I connected with  [name] on 07/03/19 at  9:40 AM EDT by telephone and verified that I am speaking with the correct person using two identifiers.   I discussed the limitations, risks, security and privacy concerns of performing an evaluation and management service by telephone and the availability of in person appointments. Staff also discussed with the patient that there may be a patient responsible charge related to this service. Patient Location: Home Provider Location: Home Additional Individuals present: None  HPI  Pt presents with concern for headaches, back pain, and arm pain.  She started with a headache 3 days ago, then her right arm started to ache from the shoulder to her hand, last night she started having pain in her back.  Has been having trouble sleeping because she is having such pain.  Headache: The headache is a dull headache that is intermittent (was present 3 days ago, then gone for a day, then came back yesterday, and is not present today), has had these a few times in the past.  She does not have HTN, but does have HLD.  No family history of stroke in her family.  No confusion, slurred speech, facial droop (does have history of bell's palsy in the past, but no residual issues), no limb weakness aside from what is described below.  R Arm Pain: Starts in her shoulder and radiates down into her arm - has been present for 2-3 days now. There is no numbness/tingling, but feels like the right side is a bit weaker than the left side.   Back Pain: She is having upper back pain just below her shoulder blade, and between the neck and the shoulder (trapezius area).  She is applying heat to the area and this does not seem to help much.    She has  struggled with prednisone in the past, so we will avoid this today.   Patient Active Problem List   Diagnosis Date Noted  . Other neutropenia ([place]) 01/10/2018  . Allergic rhinitis due to allergen 08/03/2017  . B12 deficiency 02/25/2017  . Family history of breast cancer in sister 05/24/2016  . Hypothyroidism 09/19/2015  . Asthma, moderate persistent, poorly-controlled 09/19/2015  . Perennial allergic rhinitis with seasonal variation 09/19/2015  . Major depression in remission ([place]) 09/19/2015    Social History   Tobacco Use  . Smoking status: Former Smoker    Packs/day: 0.50    Years: 5.00    Pack years: 2.50    Types: Cigarettes    Start date: 11/16/1979    Quit date: 09/18/1985    Years since quitting: 33.8  . Smokeless tobacco: Never Used  Substance Use Topics  . Alcohol use: Yes    Alcohol/week: 0.0 standard drinks    Comment: wine/socially/rarely     Current Outpatient Medications:  .  albuterol (PROVENTIL HFA;VENTOLIN HFA) 108 (90 Base) MCG/ACT inhaler, INHALE 2 PUFFS INTO THE LUNGS EVERY 6 HOURS AS NEEDED FOR WHEEZING OR SHORTNESS OF BREATH, Disp: 6.7 g, Rfl: 0 .  azelastine (ASTELIN) 0.1 % nasal spray, Place 2 sprays into both nostrils 2 (two) times daily. Use in each nostril as directed, Disp: 30 mL, Rfl:  2 .  conjugated estrogens (PREMARIN) vaginal cream, Place 1 Applicatorful vaginally daily., Disp: 42.5 g, Rfl: 12 .  fluticasone furoate-vilanterol (BREO ELLIPTA) 100-25 MCG/INH AEPB, Inhale 1 puff into the lungs daily., Disp: 60 each, Rfl: 1 .  montelukast (SINGULAIR) 10 MG tablet, Take 1 tablet (10 mg total) by mouth at bedtime., Disp: 30 tablet, Rfl: 5 .  SYNTHROID 88 MCG tablet, Take 1 tablet (88 mcg total) by mouth daily before breakfast. M-F, Disp: 30 tablet, Rfl: 1 .  Vitamin D, Ergocalciferol, (DRISDOL) 1.25 MG (50000 UT) CAPS capsule, TAKE 1 CAPSULE BY MOUTH EVERY 7 DAYS, Disp: 12 capsule, Rfl: 0 .  cyclobenzaprine (FLEXERIL) 5 MG tablet, Take 1 tablet (5 mg  total) by mouth at bedtime. (Patient not taking: Reported on 07/03/2019), Disp: 30 tablet, Rfl: 0 .  fluticasone (FLONASE) 50 MCG/ACT nasal spray, Place 2 sprays into both nostrils daily., Disp: , Rfl:   Current Facility-Administered Medications:  .  cyanocobalamin ((VITAMIN B-12)) injection 1,000 mcg, 1,000 mcg, Subcutaneous, Q30 days, Steele Sizer, MD, 1,000 mcg at 06/25/19 1102  No Known Allergies  I personally reviewed active problem list, medication list, allergies, notes from last encounter, lab results with the patient/caregiver today.  ROS  Ten systems reviewed and is negative except as mentioned in HPI  Objective  Virtual encounter, vitals not obtained.  There is no height or weight on file to calculate BMI.  Nursing Note and Vital Signs reviewed.  Physical Exam  Pulmonary/Chest: Effort normal. No respiratory distress. Speaking in complete sentences Neurological: Pt is alert and oriented to person, place, and time. Speech is normal. Psychiatric: Patient has a normal mood and affect. behavior is normal. Judgment and thought content normal.    No results found for this or any previous visit (from the past 72 hour(s)).  Assessment & Plan  1. Strain of neck muscle, initial encounter - Suspect inflammation and strain of the right trapezius.  Did discuss that my exam is quite limited over the telephone, however she is an excellent historian and does verbalize understanding of acute stroke symptoms and ER precautions.  If not improving in 3-4 days will return for in person assessment.  If anything worsens, will return sooner or present to ER or UC. - meloxicam (MOBIC) 15 MG tablet; Take 1 tablet (15 mg total) by mouth daily.  Dispense: 15 tablet; Refill: 0 - cyclobenzaprine (FLEXERIL) 5 MG tablet; Take 1 tablet (5 mg total) by mouth 3 (three) times daily as needed for muscle spasms.  Dispense: 20 tablet; Refill: 0    -Red flags and when to present for emergency care or  RTC including fever >101.70F, chest pain, shortness of breath, new/worsening/un-resolving symptoms, stroke symptoms reviewed with patient at time of visit. Follow up and care instructions discussed and provided in AVS. - I discussed the assessment and treatment plan with the patient. The patient was provided an opportunity to ask questions and all were answered. The patient agreed with the plan and demonstrated an understanding of the instructions.  - The patient was advised to call back or seek an in-person evaluation if the symptoms worsen or if the condition fails to improve as anticipated.  I provided 14 minutes of non-face-to-face time during this encounter.  Hubbard Hartshorn, FNP

## 2019-07-18 ENCOUNTER — Other Ambulatory Visit: Payer: Self-pay

## 2019-07-18 ENCOUNTER — Ambulatory Visit (INDEPENDENT_AMBULATORY_CARE_PROVIDER_SITE_OTHER): Payer: BC Managed Care – PPO | Admitting: Family Medicine

## 2019-07-18 ENCOUNTER — Encounter: Payer: Self-pay | Admitting: Family Medicine

## 2019-07-18 VITALS — BP 114/74 | HR 96 | Temp 96.9°F | Resp 16 | Ht 63.0 in | Wt 120.5 lb

## 2019-07-18 DIAGNOSIS — E785 Hyperlipidemia, unspecified: Secondary | ICD-10-CM | POA: Diagnosis not present

## 2019-07-18 DIAGNOSIS — E559 Vitamin D deficiency, unspecified: Secondary | ICD-10-CM

## 2019-07-18 DIAGNOSIS — M2550 Pain in unspecified joint: Secondary | ICD-10-CM

## 2019-07-18 DIAGNOSIS — D649 Anemia, unspecified: Secondary | ICD-10-CM

## 2019-07-18 DIAGNOSIS — R52 Pain, unspecified: Secondary | ICD-10-CM

## 2019-07-18 DIAGNOSIS — E039 Hypothyroidism, unspecified: Secondary | ICD-10-CM | POA: Diagnosis not present

## 2019-07-18 DIAGNOSIS — R7303 Prediabetes: Secondary | ICD-10-CM

## 2019-07-18 DIAGNOSIS — E538 Deficiency of other specified B group vitamins: Secondary | ICD-10-CM

## 2019-07-18 DIAGNOSIS — R5383 Other fatigue: Secondary | ICD-10-CM

## 2019-07-18 NOTE — Progress Notes (Signed)
Name: Miranda Stafford   MRN: [id number]    DOB: [birth year]   Date:07/18/2019       Progress Note  Subjective  Chief Complaint  Chief Complaint  Patient presents with  . Generalized Body Aches    Onset-3 weeks, started in her right arm and now has radiates from her shoulder to her feet. Worst in the morning, achy, hurts to walk and just getting her clothes on is a task. Just ordered a heating pad to see if that will help    HPI  Body aches: she noticed that about one month ago she developed body aches ( all over ) but also joint aches, joint stiffness that is worse at night and also after a period of rest ( such a riding in a car). She has been heating pads, try to use a massager on he muscles. She was seen by Raelyn Ensign, NP a couple of weeks ago but flexeril made her drowsy , meloxicam also did not help. She denies rashes but has noticed difficulty removing her rings in the mornings. No family history of RA or lupus, mother has osteoarthritis. 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 She states the conversations started May 2020.   Hypothyroidism:she has been taking medication on a regular basis, 88 mcgM-F but not on weekends , she prefers brand Synthroid.. She denies palpitation or change in bowel movements at this time  Hematological problems: she was seen by  Dr. Janese Banks in the past  for anemia and neutropenia, found to have lymphadenopathy and was referral to ENT, but was given reassurance, we are monitoring here now. Getting B12 monthly at our office. She also has mild anemia, colonoscopy up to date. We will recheck labs today    AR: still has poor sense of smell, seen by Dr. Cathlean Sauer, on flonase, back on xyzal, she had to stop taking singulair because of nightmares. Still has nasal polyps and daily nasal congestion but does not want to have surgery at this time because the CT was going to cost too much.   Major  Depression : it happened back in 2015 and lasted for about one year  , she was worried about her grandson, work wasn't great also problems with her dissertation that all happened in 2016. She is now more stressed with job changes and feels disappointed in the system, she feels misunderstood.   Dyslipidemia: last LDL was 173, she is not on statin therapy, we will recheck labs today since TSH was abnormal when LDL was high. She refuses statin at this time  Patient Active Problem List   Diagnosis Date Noted  . Other neutropenia ([place]) 01/10/2018  . Allergic rhinitis due to allergen 08/03/2017  . B12 deficiency 02/25/2017  . Family history of breast cancer in sister 05/24/2016  . Hypothyroidism 09/19/2015  . Asthma, moderate persistent, poorly-controlled 09/19/2015  . Perennial allergic rhinitis with seasonal variation 09/19/2015  . Major depression in remission ([place]) 09/19/2015    Past Surgical History:  Procedure Laterality Date  . ABDOMINAL HYSTERECTOMY  2005   fibroid tumor  . ADENOIDECTOMY    . CESAREAN SECTION    . TUBAL LIGATION      Family History  Problem Relation Age of Onset  . Heart disease Mother   . Hypertension Mother   . Hyperlipidemia Mother   . SIDS Son   . Breast cancer Sister 43  . Hypothyroidism Sister   .  Hypertension Sister     Social History   Socioeconomic History  . Marital status: Married    Spouse name: Abe People  . Number of children: 2  . Years of education: Not on file  . Highest education level: Professional school degree (e.g., MD, DDS, DVM, JD)  Occupational History  . Occupation: Retired Tour manager  . Financial resource strain: Not hard at all  . Food insecurity    Worry: Never true    Inability: Never true  . Transportation needs    Medical: No    Non-medical: No  Tobacco Use  . Smoking status: Former Smoker    Packs/day: 0.50    Years: 5.00    Pack years: 2.50    Types: Cigarettes    Start date: 11/16/1979    Quit  date: 09/18/1985    Years since quitting: 33.8  . Smokeless tobacco: Never Used  Substance and Sexual Activity  . Alcohol use: Yes    Alcohol/week: 0.0 standard drinks    Comment: wine/socially/rarely  . Drug use: No  . Sexual activity: Yes    Partners: Male  Lifestyle  . Physical activity    Days per week: 5 days    Minutes per session: 20 min  . Stress: Not at all  Relationships  . Social connections    Talks on phone: More than three times a week    Gets together: Three times a week    Attends religious service: More than 4 times per year    Active member of club or organization: No    Attends meetings of clubs or organizations: Never    Relationship status: Married  . Intimate partner violence    Fear of current or ex partner: No    Emotionally abused: No    Physically abused: No    Forced sexual activity: No  Other Topics Concern  . Not on file  Social History Narrative  . Not on file     Current Outpatient Medications:  .  albuterol (PROVENTIL HFA;VENTOLIN HFA) 108 (90 Base) MCG/ACT inhaler, INHALE 2 PUFFS INTO THE LUNGS EVERY 6 HOURS AS NEEDED FOR WHEEZING OR SHORTNESS OF BREATH, Disp: 6.7 g, Rfl: 0 .  azelastine (ASTELIN) 0.1 % nasal spray, Place 2 sprays into both nostrils 2 (two) times daily. Use in each nostril as directed, Disp: 30 mL, Rfl: 2 .  fluticasone (FLONASE) 50 MCG/ACT nasal spray, Place 2 sprays into both nostrils daily., Disp: , Rfl:  .  SYNTHROID 88 MCG tablet, Take 1 tablet (88 mcg total) by mouth daily before breakfast. M-F, Disp: 30 tablet, Rfl: 1 .  conjugated estrogens (PREMARIN) vaginal cream, Place 1 Applicatorful vaginally daily. (Patient not taking: Reported on 07/18/2019), Disp: 42.5 g, Rfl: 12 .  cyclobenzaprine (FLEXERIL) 5 MG tablet, Take 1 tablet (5 mg total) by mouth 3 (three) times daily as needed for muscle spasms. (Patient not taking: Reported on 07/18/2019), Disp: 20 tablet, Rfl: 0 .  fluticasone furoate-vilanterol (BREO ELLIPTA) 100-25  MCG/INH AEPB, Inhale 1 puff into the lungs daily. (Patient not taking: Reported on 07/18/2019), Disp: 60 each, Rfl: 1 .  meloxicam (MOBIC) 15 MG tablet, Take 1 tablet (15 mg total) by mouth daily. (Patient not taking: Reported on 07/18/2019), Disp: 15 tablet, Rfl: 0 .  montelukast (SINGULAIR) 10 MG tablet, Take 1 tablet (10 mg total) by mouth at bedtime. (Patient not taking: Reported on 07/18/2019), Disp: 30 tablet, Rfl: 5 .  Vitamin D, Ergocalciferol, (DRISDOL) 1.25 MG (50000  UT) CAPS capsule, TAKE 1 CAPSULE BY MOUTH EVERY 7 DAYS (Patient not taking: Reported on 07/18/2019), Disp: 12 capsule, Rfl: 0  Current Facility-Administered Medications:  .  cyanocobalamin ((VITAMIN B-12)) injection 1,000 mcg, 1,000 mcg, Subcutaneous, Q30 days, Steele Sizer, MD, 1,000 mcg at 06/25/19 1102  No Known Allergies  I personally reviewed active problem list, medication list, allergies, family history, social history, health maintenance with the patient/caregiver today.   ROS  Constitutional: Negative for fever or weight change.  Respiratory: Negative for cough and shortness of breath.   Cardiovascular: Negative for chest pain or palpitations.  Gastrointestinal: Negative for abdominal pain, no bowel changes.  Musculoskeletal: Positive for gait problem or joint swelling.  Skin: Negative for rash.  Neurological: Negative for dizziness or headache.  No other specific complaints in a complete review of systems (except as listed in HPI above).  Objective  Vitals:   07/18/19 0905  BP: 114/74  Pulse: 96  Resp: 16  Temp: (!) 96.9 F (36.1 C)  TempSrc: Temporal  SpO2: 96%  Weight: 120 lb 8 oz (54.7 kg)  Height: 5\' 3"  (1.6 m)    Body mass index is 21.35 kg/m.  Physical Exam  Constitutional: Patient appears well-developed and well-nourished.  No distress.  HEENT: head atraumatic, normocephalic, pupils equal and reactive to light, neck supple, Cardiovascular: Normal rate, regular rhythm and normal heart  sounds.  No murmur heard. No BLE edema. Pulmonary/Chest: Effort normal and breath sounds normal. No respiratory distress. Abdominal: Soft.  There is no tenderness. Muscular skeletal: no synovitis or erythema, sore all over during palpation , difficulty getting up from chair, walking slowly  Psychiatric: Patient has a normal mood and affect. behavior is normal. Judgment and thought content normal.  Recent Results (from the past 2160 hour(s))  TSH     Status: Abnormal   Collection Time: 04/24/19  8:50 AM  Result Value Ref Range   TSH 6.06 (H) 0.40 - 4.50 mIU/L     PHQ2/9: Depression screen [facility] 2/9 07/18/2019 07/03/2019 03/07/2019 12/18/2018 09/06/2018  Decreased Interest 0 0 0 0 0  Down, Depressed, Hopeless 0 0 0 0 0  PHQ - 2 Score 0 0 0 0 0  Altered sleeping 3 0 0 - 0  Tired, decreased energy 3 0 0 - 0  Change in appetite 0 0 0 - 0  Feeling bad or failure about yourself  0 0 0 - 0  Trouble concentrating 1 0 0 - 0  Moving slowly or fidgety/restless 0 0 0 - 0  Suicidal thoughts 0 0 0 - 0  PHQ-9 Score 7 0 0 - 0  Difficult doing work/chores Very difficult Not difficult at all Not difficult at all - Not difficult at all    phq 9 is negative   Fall Risk: Fall Risk  07/18/2019 07/03/2019 03/07/2019 10/24/2018 09/06/2018  Falls in the past year? 0 0 0 0 No  Number falls in past yr: 0 0 0 - -  Injury with Fall? 0 0 0 - -  Follow up - Falls evaluation completed - - -     Functional Status Survey: Is the patient deaf or have difficulty hearing?: No Does the patient have difficulty seeing, even when wearing glasses/contacts?: Yes Does the patient have difficulty concentrating, remembering, or making decisions?: No Does the patient have difficulty walking or climbing stairs?: No Does the patient have difficulty dressing or bathing?: No Does the patient have difficulty doing errands alone such as visiting a doctor's office or  shopping?: No    Assessment & Plan  1. Hypothyroidism,  adult  - TSH  2. B12 deficiency  - Vitamin B12  3. Pre-diabetes  - Hemoglobin A1c  4. Dyslipidemia  - Lipid panel  5. Vitamin D deficiency  - VITAMIN D 25 Hydroxy (Vit-D Deficiency, Fractures)  6. Generalized body aches  - COMPLETE METABOLIC PANEL WITH GFR - C-reactive protein - ANA,IFA RA Diag Pnl w/rflx Tit/Patn  7. Other fatigue  - COMPLETE METABOLIC PANEL WITH GFR - C-reactive protein - ANA,IFA RA Diag Pnl w/rflx Tit/Patn  8. Anemia, unspecified type  - CBC with Differential/Platelet - Iron, TIBC and Ferritin Panel - Vitamin B12  9. Pain in joint, multiple sites  - Sedimentation rate - C-reactive protein - ANA,IFA RA Diag Pnl w/rflx Tit/Patn

## 2019-07-21 ENCOUNTER — Other Ambulatory Visit: Payer: Self-pay | Admitting: Family Medicine

## 2019-07-21 DIAGNOSIS — R52 Pain, unspecified: Secondary | ICD-10-CM

## 2019-07-21 DIAGNOSIS — R768 Other specified abnormal immunological findings in serum: Secondary | ICD-10-CM

## 2019-07-21 DIAGNOSIS — R700 Elevated erythrocyte sedimentation rate: Secondary | ICD-10-CM

## 2019-07-21 LAB — VITAMIN B12: Vitamin B-12: 578 pg/mL (ref 200–1100)

## 2019-07-21 LAB — COMPLETE METABOLIC PANEL WITH GFR
AG Ratio: 1.7 (calc) (ref 1.0–2.5)
ALT: 15 U/L (ref 6–29)
AST: 22 U/L (ref 10–35)
Albumin: 4.2 g/dL (ref 3.6–5.1)
Alkaline phosphatase (APISO): 43 U/L (ref 37–153)
BUN: 12 mg/dL (ref 7–25)
CO2: 26 mmol/L (ref 20–32)
Calcium: 9.4 mg/dL (ref 8.6–10.4)
Chloride: 105 mmol/L (ref 98–110)
Creat: 0.84 mg/dL (ref 0.50–0.99)
GFR, Est African American: 87 mL/min/{1.73_m2} (ref >=60)
GFR, Est Non African American: 75 mL/min/{1.73_m2} (ref >=60)
Globulin: 2.5 g/dL (calc) (ref 1.9–3.7)
Glucose, Bld: 93 mg/dL (ref 65–99)
Potassium: 4 mmol/L (ref 3.5–5.3)
Sodium: 139 mmol/L (ref 135–146)
Total Bilirubin: 0.3 mg/dL (ref 0.2–1.2)
Total Protein: 6.7 g/dL (ref 6.1–8.1)

## 2019-07-21 LAB — ANTI-NUCLEAR AB-TITER (ANA TITER)
ANA TITER: 1:80 {titer} — ABNORMAL HIGH
ANA Titer 1: 1:80 {titer} — ABNORMAL HIGH

## 2019-07-21 LAB — IRON,TIBC AND FERRITIN PANEL
%SAT: 19 % (calc) (ref 16–45)
Ferritin: 73 ng/mL (ref 16–288)
Iron: 49 ug/dL (ref 45–160)
TIBC: 256 mcg/dL (calc) (ref 250–450)

## 2019-07-21 LAB — ANA,IFA RA DIAG PNL W/RFLX TIT/PATN
Anti Nuclear Antibody (ANA): POSITIVE — AB
Cyclic Citrullin Peptide Ab: <16 UNITS
Rhuematoid fact SerPl-aCnc: <14 IU/mL (ref <14)

## 2019-07-21 LAB — CBC WITH DIFFERENTIAL/PLATELET
Absolute Monocytes: 238 cells/uL (ref 200–950)
Basophils Absolute: 31 cells/uL (ref 0–200)
Basophils Relative: 0.9 %
Eosinophils Absolute: 347 cells/uL (ref 15–500)
Eosinophils Relative: 10.2 %
HCT: 35.4 % (ref 35.0–45.0)
Hemoglobin: 11 g/dL — ABNORMAL LOW (ref 11.7–15.5)
Lymphs Abs: 1680 cells/uL (ref 850–3900)
MCH: 25.7 pg — ABNORMAL LOW (ref 27.0–33.0)
MCHC: 31.1 g/dL — ABNORMAL LOW (ref 32.0–36.0)
MCV: 82.7 fL (ref 80.0–100.0)
MPV: 10.6 fL (ref 7.5–12.5)
Monocytes Relative: 7 %
Neutro Abs: 1105 cells/uL — ABNORMAL LOW (ref 1500–7800)
Neutrophils Relative %: 32.5 %
Platelets: 231 10*3/uL (ref 140–400)
RBC: 4.28 10*6/uL (ref 3.80–5.10)
RDW: 13.1 % (ref 11.0–15.0)
Total Lymphocyte: 49.4 %
WBC: 3.4 10*3/uL — ABNORMAL LOW (ref 3.8–10.8)

## 2019-07-21 LAB — LIPID PANEL
Cholesterol: 224 mg/dL — ABNORMAL HIGH (ref <200)
HDL: 68 mg/dL (ref >=50)
LDL Cholesterol (Calc): 137 mg/dL (calc) — ABNORMAL HIGH
Non-HDL Cholesterol (Calc): 156 mg/dL (calc) — ABNORMAL HIGH (ref <130)
Total CHOL/HDL Ratio: 3.3 (calc) (ref <5.0)
Triglycerides: 89 mg/dL (ref <150)

## 2019-07-21 LAB — SEDIMENTATION RATE: Sed Rate: 33 mm/h — ABNORMAL HIGH (ref 0–30)

## 2019-07-21 LAB — C-REACTIVE PROTEIN: CRP: 6.7 mg/L (ref <8.0)

## 2019-07-21 LAB — VITAMIN D 25 HYDROXY (VIT D DEFICIENCY, FRACTURES): Vit D, 25-Hydroxy: 41 ng/mL (ref 30–100)

## 2019-07-21 LAB — HEMOGLOBIN A1C
Hgb A1c MFr Bld: 5.7 % of total Hgb — ABNORMAL HIGH (ref <5.7)
Mean Plasma Glucose: 117 (calc)
eAG (mmol/L): 6.5 (calc)

## 2019-07-21 LAB — TSH: TSH: 1.42 mIU/L (ref 0.40–4.50)

## 2019-07-22 ENCOUNTER — Encounter: Payer: Self-pay | Admitting: Family Medicine

## 2019-08-16 ENCOUNTER — Ambulatory Visit (INDEPENDENT_AMBULATORY_CARE_PROVIDER_SITE_OTHER): Payer: BC Managed Care – PPO | Admitting: Family Medicine

## 2019-08-16 ENCOUNTER — Other Ambulatory Visit: Payer: Self-pay

## 2019-08-16 ENCOUNTER — Encounter: Payer: Self-pay | Admitting: Family Medicine

## 2019-08-16 DIAGNOSIS — R700 Elevated erythrocyte sedimentation rate: Secondary | ICD-10-CM | POA: Diagnosis not present

## 2019-08-16 DIAGNOSIS — E559 Vitamin D deficiency, unspecified: Secondary | ICD-10-CM | POA: Diagnosis not present

## 2019-08-16 DIAGNOSIS — R52 Pain, unspecified: Secondary | ICD-10-CM

## 2019-08-16 DIAGNOSIS — R768 Other specified abnormal immunological findings in serum: Secondary | ICD-10-CM

## 2019-08-16 MED ORDER — VITAMIN D (ERGOCALCIFEROL) 1.25 MG (50000 UNIT) PO CAPS: 50000.0000 [IU] | ORAL_CAPSULE | ORAL | 1 refills | Status: DC

## 2019-08-16 NOTE — Progress Notes (Signed)
Name: Miranda Stafford   MRN: [id number]    DOB: 08/23/1956   Date:08/16/2019       Progress Note  Subjective  Chief Complaint  Chief Complaint  Patient presents with  . Follow-up    I connected with  [name]  on 08/16/19 at  8:40 AM EDT by a video enabled telemedicine application and verified that I am speaking with the correct person using two identifiers.  I discussed the limitations of evaluation and management by telemedicine and the availability of in person appointments. The patient expressed understanding and agreed to proceed. Staff also discussed with the patient that there may be a patient responsible charge related to this service. Patient Location: at home  Provider Location: [place]   HPI  Abnormal labs and elevated ANA: she was seen by Rheumatologist, Dr. Dossie Der, , had some specialty labs done and results are coming this week. She continues to have body aches, feels stiff in am's and has some numbness on feet when she first stands up, but symptoms improves throughout the day. She has also noticed difficulty sleeping ( not sure if it is because she has been anxious) but otherwise she is feeling okay.  Vitamin D and B12 def: she is getting B12 injections and would like a refill of vitamin D rx.   Patient Active Problem List   Diagnosis Date Noted  . Other neutropenia ([place]) 01/10/2018  . Allergic rhinitis due to allergen 08/03/2017  . B12 deficiency 02/25/2017  . Family history of breast cancer in sister 05/24/2016  . Hypothyroidism 09/19/2015  . Asthma, moderate persistent, poorly-controlled 09/19/2015  . Perennial allergic rhinitis with seasonal variation 09/19/2015  . Major depression in remission ([place]) 09/19/2015    Past Surgical History:  Procedure Laterality Date  . ABDOMINAL HYSTERECTOMY  2005   fibroid tumor  . ADENOIDECTOMY    . CESAREAN SECTION    . TUBAL LIGATION      Family History  Problem Relation Age of Onset  . Heart  disease Mother   . Hypertension Mother   . Hyperlipidemia Mother   . SIDS Son   . Breast cancer Sister 90  . Hypothyroidism Sister   . Hypertension Sister     Social History   Socioeconomic History  . Marital status: Married    Spouse name: Abe People  . Number of children: 2  . Years of education: Not on file  . Highest education level: Professional school degree (e.g., MD, DDS, DVM, JD)  Occupational History  . Occupation: Retired Tour manager  . Financial resource strain: Not hard at all  . Food insecurity    Worry: Never true    Inability: Never true  . Transportation needs    Medical: No    Non-medical: No  Tobacco Use  . Smoking status: Former Smoker    Packs/day: 0.50    Years: 5.00    Pack years: 2.50    Types: Cigarettes    Start date: 11/16/1979    Quit date: 09/18/1985    Years since quitting: 33.9  . Smokeless tobacco: Never Used  Substance and Sexual Activity  . Alcohol use: Yes    Alcohol/week: 0.0 standard drinks    Comment: wine/socially/rarely  . Drug use: No  . Sexual activity: Yes    Partners: Male  Lifestyle  . Physical activity    Days per week: 5 days    Minutes per session: 20 min  . Stress: Not at all  Relationships  .  Social connections    Talks on phone: More than three times a week    Gets together: Three times a week    Attends religious service: More than 4 times per year    Active member of club or organization: No    Attends meetings of clubs or organizations: Never    Relationship status: Married  . Intimate partner violence    Fear of current or ex partner: No    Emotionally abused: No    Physically abused: No    Forced sexual activity: No  Other Topics Concern  . Not on file  Social History Narrative  . Not on file     Current Outpatient Medications:  .  albuterol (PROVENTIL HFA;VENTOLIN HFA) 108 (90 Base) MCG/ACT inhaler, INHALE 2 PUFFS INTO THE LUNGS EVERY 6 HOURS AS NEEDED FOR WHEEZING OR SHORTNESS OF BREATH,  Disp: 6.7 g, Rfl: 0 .  azelastine (ASTELIN) 0.1 % nasal spray, Place 2 sprays into both nostrils 2 (two) times daily. Use in each nostril as directed, Disp: 30 mL, Rfl: 2 .  cyclobenzaprine (FLEXERIL) 5 MG tablet, Take 1 tablet (5 mg total) by mouth 3 (three) times daily as needed for muscle spasms., Disp: 20 tablet, Rfl: 0 .  fluticasone (FLONASE) 50 MCG/ACT nasal spray, Place 2 sprays into both nostrils daily., Disp: , Rfl:  .  montelukast (SINGULAIR) 10 MG tablet, Take 1 tablet (10 mg total) by mouth at bedtime., Disp: 30 tablet, Rfl: 5 .  SYNTHROID 88 MCG tablet, Take 1 tablet (88 mcg total) by mouth daily before breakfast. M-F, Disp: 30 tablet, Rfl: 1 .  conjugated estrogens (PREMARIN) vaginal cream, Place 1 Applicatorful vaginally daily. (Patient not taking: Reported on 07/18/2019), Disp: 42.5 g, Rfl: 12 .  fluticasone furoate-vilanterol (BREO ELLIPTA) 100-25 MCG/INH AEPB, Inhale 1 puff into the lungs daily. (Patient not taking: Reported on 08/16/2019), Disp: 60 each, Rfl: 1 .  meloxicam (MOBIC) 15 MG tablet, Take 1 tablet (15 mg total) by mouth daily. (Patient not taking: Reported on 07/18/2019), Disp: 15 tablet, Rfl: 0 .  Vitamin D, Ergocalciferol, (DRISDOL) 1.25 MG (50000 UT) CAPS capsule, TAKE 1 CAPSULE BY MOUTH EVERY 7 DAYS (Patient not taking: Reported on 07/18/2019), Disp: 12 capsule, Rfl: 0  Current Facility-Administered Medications:  .  cyanocobalamin ((VITAMIN B-12)) injection 1,000 mcg, 1,000 mcg, Subcutaneous, Q30 days, Steele Sizer, MD, 1,000 mcg at 06/25/19 1102  No Known Allergies  I personally reviewed active problem list, medication list, allergies, family history, social history with the patient/caregiver today.   ROS  Ten systems reviewed and is negative except as mentioned in HPI   Objective  Virtual encounter, vitals not obtained.  There is no height or weight on file to calculate BMI.  Physical Exam  Awake, alert and oriented  PHQ2/9: Depression screen [facility]  2/9 08/16/2019 07/18/2019 07/03/2019 03/07/2019 12/18/2018  Decreased Interest 0 0 0 0 0  Down, Depressed, Hopeless 0 0 0 0 0  PHQ - 2 Score 0 0 0 0 0  Altered sleeping 0 3 0 0 -  Tired, decreased energy 0 3 0 0 -  Change in appetite 0 0 0 0 -  Feeling bad or failure about yourself  0 0 0 0 -  Trouble concentrating 0 1 0 0 -  Moving slowly or fidgety/restless 0 0 0 0 -  Suicidal thoughts 0 0 0 0 -  PHQ-9 Score 0 7 0 0 -  Difficult doing work/chores Not difficult at all Very difficult Not  difficult at all Not difficult at all -  Some recent data might be hidden   PHQ-2/9 Result is negative.    Fall Risk: Fall Risk  08/16/2019 07/18/2019 07/03/2019 03/07/2019 10/24/2018  Falls in the past year? 0 0 0 0 0  Number falls in past yr: 0 0 0 0 -  Injury with Fall? 0 0 0 0 -  Follow up - - Falls evaluation completed - -     Assessment & Plan  1. Positive ANA (antinuclear antibody)   2. Elevated sed rate   3. Generalized body aches  Waiting for results from recent labs done by Rheumatologist   I discussed the assessment and treatment plan with the patient. The patient was provided an opportunity to ask questions and all were answered. The patient agreed with the plan and demonstrated an understanding of the instructions.  The patient was advised to call back or seek an in-person evaluation if the symptoms worsen or if the condition fails to improve as anticipated.  I provided 15  minutes of non-face-to-face time during this encounter.

## 2019-08-22 ENCOUNTER — Encounter: Payer: Self-pay | Admitting: Family Medicine

## 2019-08-28 ENCOUNTER — Other Ambulatory Visit: Payer: Self-pay

## 2019-08-28 ENCOUNTER — Ambulatory Visit (INDEPENDENT_AMBULATORY_CARE_PROVIDER_SITE_OTHER): Payer: BC Managed Care – PPO

## 2019-08-28 DIAGNOSIS — E538 Deficiency of other specified B group vitamins: Secondary | ICD-10-CM

## 2019-08-28 MED ORDER — CYANOCOBALAMIN 1000 MCG/ML IJ SOLN
1000.0000 ug | Freq: Once | INTRAMUSCULAR | Status: AC
  Administered 2019-08-28: 1000 ug via INTRAMUSCULAR

## 2019-08-29 ENCOUNTER — Encounter: Payer: Self-pay | Admitting: Family Medicine

## 2019-09-06 ENCOUNTER — Encounter: Payer: Self-pay | Admitting: Family Medicine

## 2019-09-06 ENCOUNTER — Ambulatory Visit (INDEPENDENT_AMBULATORY_CARE_PROVIDER_SITE_OTHER): Payer: BC Managed Care – PPO | Admitting: Family Medicine

## 2019-09-06 ENCOUNTER — Other Ambulatory Visit: Payer: Self-pay

## 2019-09-06 VITALS — BP 126/80 | HR 70 | Temp 96.9°F | Resp 16 | Ht 63.0 in | Wt 120.4 lb

## 2019-09-06 DIAGNOSIS — R52 Pain, unspecified: Secondary | ICD-10-CM

## 2019-09-06 DIAGNOSIS — M2560 Stiffness of unspecified joint, not elsewhere classified: Secondary | ICD-10-CM

## 2019-09-06 MED ORDER — MELOXICAM 15 MG PO TABS: 15.0000 mg | ORAL_TABLET | Freq: Every day | ORAL | 1 refills | Status: DC

## 2019-09-06 NOTE — Progress Notes (Signed)
Name: Miranda Stafford   MRN: [id number]    DOB: 1956/11/13   Date:09/06/2019       Progress Note  Subjective  Chief Complaint  Chief Complaint  Patient presents with  . Follow-up    6 month F/U  . Leg Pain    bilateral leg pain x several months. Pain is aching and comes and goes.    HPI  Stiffness: she was seen by Rheumatologist and negative evaluation, she states symptoms no longer constant, last week she had a severe episode and stayed in bed all day, she states she was sore all over, had lack of appetite and felt bad.. She states able to walk 2 miles this past weekend. Not as sore or stiff at this time    Patient Active Problem List   Diagnosis Date Noted  . Other neutropenia ([place]) 01/10/2018  . Allergic rhinitis due to allergen 08/03/2017  . B12 deficiency 02/25/2017  . Family history of breast cancer in sister 05/24/2016  . Hypothyroidism 09/19/2015  . Asthma, moderate persistent, poorly-controlled 09/19/2015  . Perennial allergic rhinitis with seasonal variation 09/19/2015  . Major depression in remission ([place]) 09/19/2015    Past Surgical History:  Procedure Laterality Date  . ABDOMINAL HYSTERECTOMY  2005   fibroid tumor  . ADENOIDECTOMY    . CESAREAN SECTION    . TUBAL LIGATION      Family History  Problem Relation Age of Onset  . Heart disease Mother   . Hypertension Mother   . Hyperlipidemia Mother   . SIDS Son   . Breast cancer Sister 13  . Hypothyroidism Sister   . Hypertension Sister     Social History   Socioeconomic History  . Marital status: Married    Spouse name: Abe People  . Number of children: 2  . Years of education: Not on file  . Highest education level: Professional school degree (e.g., MD, DDS, DVM, JD)  Occupational History  . Occupation: Retired Tour manager  . Financial resource strain: Not hard at all  . Food insecurity    Worry: Never true    Inability: Never true  . Transportation needs    Medical: No    Non-medical:  No  Tobacco Use  . Smoking status: Former Smoker    Packs/day: 0.50    Years: 5.00    Pack years: 2.50    Types: Cigarettes    Start date: 11/16/1979    Quit date: 09/18/1985    Years since quitting: 33.9  . Smokeless tobacco: Never Used  Substance and Sexual Activity  . Alcohol use: Yes    Alcohol/week: 0.0 standard drinks    Comment: wine/socially/rarely  . Drug use: No  . Sexual activity: Yes    Partners: Male  Lifestyle  . Physical activity    Days per week: 5 days    Minutes per session: 20 min  . Stress: Not at all  Relationships  . Social connections    Talks on phone: More than three times a week    Gets together: Three times a week    Attends religious service: More than 4 times per year    Active member of club or organization: No    Attends meetings of clubs or organizations: Never    Relationship status: Married  . Intimate partner violence    Fear of current or ex partner: No    Emotionally abused: No    Physically abused: No    Forced sexual activity: No  Other Topics Concern  . Not on file  Social History Narrative  . Not on file     Current Outpatient Medications:  .  albuterol (PROVENTIL HFA;VENTOLIN HFA) 108 (90 Base) MCG/ACT inhaler, INHALE 2 PUFFS INTO THE LUNGS EVERY 6 HOURS AS NEEDED FOR WHEEZING OR SHORTNESS OF BREATH, Disp: 6.7 g, Rfl: 0 .  conjugated estrogens (PREMARIN) vaginal cream, Place 1 Applicatorful vaginally daily., Disp: 42.5 g, Rfl: 12 .  cyclobenzaprine (FLEXERIL) 5 MG tablet, Take 1 tablet (5 mg total) by mouth 3 (three) times daily as needed for muscle spasms., Disp: 20 tablet, Rfl: 0 .  fluticasone (FLONASE) 50 MCG/ACT nasal spray, Place 2 sprays into both nostrils daily., Disp: , Rfl:  .  fluticasone furoate-vilanterol (BREO ELLIPTA) 100-25 MCG/INH AEPB, Inhale 1 puff into the lungs daily., Disp: 60 each, Rfl: 1 .  montelukast (SINGULAIR) 10 MG tablet, Take 1 tablet (10 mg total) by mouth at bedtime., Disp: 30 tablet, Rfl: 5 .   SYNTHROID 88 MCG tablet, Take 1 tablet (88 mcg total) by mouth daily before breakfast. M-F, Disp: 30 tablet, Rfl: 1 .  Vitamin D, Ergocalciferol, (DRISDOL) 1.25 MG (50000 UT) CAPS capsule, Take 1 capsule (50,000 Units total) by mouth every 7 (seven) days., Disp: 12 capsule, Rfl: 1 .  azelastine (ASTELIN) 0.1 % nasal spray, Place 2 sprays into both nostrils 2 (two) times daily. Use in each nostril as directed (Patient not taking: Reported on 09/06/2019), Disp: 30 mL, Rfl: 2 .  meloxicam (MOBIC) 15 MG tablet, Take 1 tablet (15 mg total) by mouth daily. (Patient not taking: Reported on 07/18/2019), Disp: 15 tablet, Rfl: 0  Current Facility-Administered Medications:  .  cyanocobalamin ((VITAMIN B-12)) injection 1,000 mcg, 1,000 mcg, Subcutaneous, Q30 days, Steele Sizer, MD, 1,000 mcg at 06/25/19 1102  No Known Allergies  I personally reviewed active problem list, medication list, allergies, family history, social history, health maintenance with the patient/caregiver today.   ROS  Ten systems reviewed and is negative except as mentioned in HPI   Objective  Vitals:   09/06/19 1048  BP: 126/80  Pulse: 70  Resp: 16  Temp: (!) 96.9 F (36.1 C)  TempSrc: Temporal  SpO2: 97%  Weight: 120 lb 6.4 oz (54.6 kg)  Height: 5\' 3"  (1.6 m)    Body mass index is 21.33 kg/m.  Physical Exam  Constitutional: Patient appears well-developed and well-nourished.  No distress.  HEENT: head atraumatic, normocephalic, pupils equal and reactive to light Cardiovascular: Normal rate, regular rhythm and normal heart sounds.  No murmur heard. No BLE edema. Pulmonary/Chest: Effort normal and breath sounds normal. No respiratory distress. Abdominal: Soft.  There is no tenderness. Muscular Skeletal: no effusion or erythema, no muscle tenderness during exam  Psychiatric: Patient has a normal mood and affect. behavior is normal. Judgment and thought content normal.  Recent Results (from the past 2160 hour(s))   Lipid panel     Status: Abnormal   Collection Time: 07/18/19 12:00 AM  Result Value Ref Range   Cholesterol 224 (H) <200 mg/dL   HDL 68 > OR = 50 mg/dL   Triglycerides 89 <150 mg/dL   LDL Cholesterol (Calc) 137 (H) mg/dL (calc)    Comment: Reference range: <100 . Desirable range <100 mg/dL for primary prevention;   <70 mg/dL for patients with CHD or diabetic patients  with > or = 2 CHD risk factors. Marland Kitchen LDL-C is now calculated using the Martin-Hopkins  calculation, which is a validated novel method providing  better  accuracy than the Friedewald equation in the  estimation of LDL-C.  Cresenciano Genre et al. Annamaria Helling. [id number]): 2061-2068  (http://education.[facility].com/faq/FAQ164)    Total CHOL/HDL Ratio 3.3 <5.0 (calc)   Non-HDL Cholesterol (Calc) 156 (H) <130 mg/dL (calc)    Comment: For patients with diabetes plus 1 major ASCVD risk  factor, treating to a non-HDL-C goal of <100 mg/dL  (LDL-C of <70 mg/dL) is considered a therapeutic  option.   COMPLETE METABOLIC PANEL WITH GFR     Status: None   Collection Time: 07/18/19 12:00 AM  Result Value Ref Range   Glucose, Bld 93 65 - 99 mg/dL    Comment: .            Fasting reference interval .    BUN 12 7 - 25 mg/dL   Creat 0.84 0.50 - 0.99 mg/dL    Comment: For patients >22 years of age, the reference limit for Creatinine is approximately 13% higher for people identified as African-American. .    GFR, Est Non African American 75 > OR = 60 mL/min/1.1m2   GFR, Est African American 87 > OR = 60 mL/min/1.68m2   BUN/Creatinine Ratio NOT APPLICABLE 6 - 22 (calc)   Sodium 139 135 - 146 mmol/L   Potassium 4.0 3.5 - 5.3 mmol/L   Chloride 105 98 - 110 mmol/L   CO2 26 20 - 32 mmol/L   Calcium 9.4 8.6 - 10.4 mg/dL   Total Protein 6.7 6.1 - 8.1 g/dL   Albumin 4.2 3.6 - 5.1 g/dL   Globulin 2.5 1.9 - 3.7 g/dL (calc)   AG Ratio 1.7 1.0 - 2.5 (calc)   Total Bilirubin 0.3 0.2 - 1.2 mg/dL   Alkaline phosphatase (APISO) 43 37 - 153  U/L   AST 22 10 - 35 U/L   ALT 15 6 - 29 U/L  CBC with Differential/Platelet     Status: Abnormal   Collection Time: 07/18/19 12:00 AM  Result Value Ref Range   WBC 3.4 (L) 3.8 - 10.8 Thousand/uL   RBC 4.28 3.80 - 5.10 Million/uL   Hemoglobin 11.0 (L) 11.7 - 15.5 g/dL   HCT 35.4 35.0 - 45.0 %   MCV 82.7 80.0 - 100.0 fL   MCH 25.7 (L) 27.0 - 33.0 pg   MCHC 31.1 (L) 32.0 - 36.0 g/dL   RDW 13.1 11.0 - 15.0 %   Platelets 231 140 - 400 Thousand/uL   MPV 10.6 7.5 - 12.5 fL   Neutro Abs 1,105 (L) 1,500 - 7,800 cells/uL   Lymphs Abs 1,680 850 - 3,900 cells/uL   Absolute Monocytes 238 200 - 950 cells/uL   Eosinophils Absolute 347 15 - 500 cells/uL   Basophils Absolute 31 0 - 200 cells/uL   Neutrophils Relative % 32.5 %   Total Lymphocyte 49.4 %   Monocytes Relative 7.0 %   Eosinophils Relative 10.2 %   Basophils Relative 0.9 %  Hemoglobin A1c     Status: Abnormal   Collection Time: 07/18/19 12:00 AM  Result Value Ref Range   Hgb A1c MFr Bld 5.7 (H) <5.7 % of total Hgb    Comment: For someone without known diabetes, a hemoglobin  A1c value between 5.7% and 6.4% is consistent with prediabetes and should be confirmed with a  follow-up test. . For someone with known diabetes, a value <7% indicates that their diabetes is well controlled. A1c targets should be individualized based on duration of diabetes, age, comorbid conditions, and other considerations. . This assay  result is consistent with an increased risk of diabetes. . Currently, no consensus exists regarding use of hemoglobin A1c for diagnosis of diabetes for children. .    Mean Plasma Glucose 117 (calc)   eAG (mmol/L) 6.5 (calc)  Iron, TIBC and Ferritin Panel     Status: None   Collection Time: 07/18/19 12:00 AM  Result Value Ref Range   Iron 49 45 - 160 mcg/dL   TIBC 256 250 - 450 mcg/dL (calc)   %SAT 19 16 - 45 % (calc)   Ferritin 73 16 - 288 ng/mL  Vitamin B12     Status: None   Collection Time: 07/18/19 12:00  AM  Result Value Ref Range   Vitamin B-12 578 200 - 1,100 pg/mL  VITAMIN D 25 Hydroxy (Vit-D Deficiency, Fractures)     Status: None   Collection Time: 07/18/19 12:00 AM  Result Value Ref Range   Vit D, 25-Hydroxy 41 30 - 100 ng/mL    Comment: Vitamin D Status         25-OH Vitamin D: . Deficiency:                    <20 ng/mL Insufficiency:             20 - 29 ng/mL Optimal:                 > or = 30 ng/mL . For 25-OH Vitamin D testing on patients on  D2-supplementation and patients for whom quantitation  of D2 and D3 fractions is required, the [facility](TM) 25-OH VIT D, (D2,D3), LC/MS/MS is recommended: order  code [phone] (patients >85yrs). See Note 1 . Note 1 . For additional information, please refer to  http://education.[facility].com/faq/FAQ199  (This link is being provided for informational/ educational purposes only.)   TSH     Status: None   Collection Time: 07/18/19 12:00 AM  Result Value Ref Range   TSH 1.42 0.40 - 4.50 mIU/L  Sedimentation rate     Status: Abnormal   Collection Time: 07/18/19 12:00 AM  Result Value Ref Range   Sed Rate 33 (H) 0 - 30 mm/h  C-reactive protein     Status: None   Collection Time: 07/18/19 12:00 AM  Result Value Ref Range   CRP 6.7 <8.0 mg/L  ANA,IFA RA Diag Pnl w/rflx Tit/Patn     Status: Abnormal   Collection Time: 07/18/19 12:00 AM  Result Value Ref Range   Anti Nuclear Antibody (ANA) POSITIVE (A) NEGATIVE    Comment: ANA IFA is a first line screen for detecting the presence of up to approximately 150 autoantibodies in various autoimmune diseases. A positive ANA IFA result is suggestive of autoimmune disease and reflexes to titer and pattern. Further laboratory testing may be considered if clinically indicated. . For additional information, please refer to http://education.[facility].com/faq/FAQ177 (This link is being provided for informational/ educational purposes only.) .    Rhuematoid fact SerPl-aCnc  99991111 [id number] IU/mL   Cyclic Citrullin Peptide Ab <16 UNITS    Comment: Reference Range Negative:            <20 Weak Positive:       20-39 Moderate Positive:   40-59 Strong Positive:     >59 .    INTERPRETATION      Comment: . A positive ANA, IFA indicates that one or more  antibodies associated with connective tissue disease could be positive. The RF and CCP assays are each  65-70% sensitive for  established rheumatoid arthritis.  While it is still possible that this patient has  rheumatoid arthritis, other connective tissue  diseases should be considered.  Veto Kemps ab-titer (ANA titer)     Status: Abnormal   Collection Time: 07/18/19 12:00 AM  Result Value Ref Range   ANA Titer 1 1:80 (H) titer    Comment: A low level ANA titer may be present in pre-clinical autoimmune diseases and normal individuals.                 Reference Range                 <1:40        Negative                 1:40-1:80    Low Antibody Level                 >1:80        Elevated Antibody Level .    ANA Pattern 1 Nuclear, Speckled (A)     Comment: Speckled pattern is associated with mixed connective tissue disease (MCTD), systemic lupus erythematosus (SLE), Sjogren's syndrome, dermatomyositis, and  systemic sclerosis/polymyositis overlap. . AC-2,4,5,29: Speckled . International Consensus on ANA Patterns ([email or web address]    ANA TITER 1:80 (H) titer    Comment: A low level ANA titer may be present in pre-clinical autoimmune diseases and normal individuals.                 Reference Range                 <1:40        Negative                 1:40-1:80    Low Antibody Level                 >1:80        Elevated Antibody Level .    ANA PATTERN Nuclear, Homogeneous (A)     Comment: Homogeneous pattern is associated with systemic lupus erythematosus (SLE), drug-induced lupus and juvenile idiopathic arthritis. . AC-1: Homogeneous . International Consensus on ANA  Patterns ([email or web address]      PHQ2/9: Depression screen [facility] 2/9 09/06/2019 08/16/2019 07/18/2019 07/03/2019 03/07/2019  Decreased Interest 0 0 0 0 0  Down, Depressed, Hopeless 0 0 0 0 0  PHQ - 2 Score 0 0 0 0 0  Altered sleeping 0 0 3 0 0  Tired, decreased energy 0 0 3 0 0  Change in appetite 0 0 0 0 0  Feeling bad or failure about yourself  0 0 0 0 0  Trouble concentrating 0 0 1 0 0  Moving slowly or fidgety/restless 0 0 0 0 0  Suicidal thoughts 0 0 0 0 0  PHQ-9 Score 0 0 7 0 0  Difficult doing work/chores - Not difficult at all Very difficult Not difficult at all Not difficult at all  Some recent data might be hidden    phq 9 is negative  Fall Risk: Fall Risk  09/06/2019 08/16/2019 07/18/2019 07/03/2019 03/07/2019  Falls in the past year? 0 0 0 0 0  Number falls in past yr: 0 0 0 0 0  Injury with Fall? 0 0 0 0 0  Follow up - - - Falls evaluation completed -    Functional Status Survey: Is the patient deaf or have difficulty hearing?: No Does the patient have difficulty seeing, even when  wearing glasses/contacts?: No Does the patient have difficulty concentrating, remembering, or making decisions?: No Does the patient have difficulty walking or climbing stairs?: No Does the patient have difficulty dressing or bathing?: No Does the patient have difficulty doing errands alone such as visiting a doctor's office or shopping?: No    Assessment & Plan  1. Joint stiffness  - meloxicam (MOBIC) 15 MG tablet; Take 1 tablet (15 mg total) by mouth daily.  Dispense: 30 tablet; Refill: 1  2. Generalized body aches  - meloxicam (MOBIC) 15 MG tablet; Take 1 tablet (15 mg total) by mouth daily.  Dispense: 30 tablet; Refill: 1

## 2019-10-17 ENCOUNTER — Telehealth: Payer: Self-pay | Admitting: Family Medicine

## 2019-10-17 NOTE — Telephone Encounter (Signed)
Pt called in to schedule her B -12, not showing order in system. Please assist.

## 2019-10-19 ENCOUNTER — Ambulatory Visit (INDEPENDENT_AMBULATORY_CARE_PROVIDER_SITE_OTHER): Payer: BC Managed Care – PPO

## 2019-10-19 ENCOUNTER — Other Ambulatory Visit: Payer: Self-pay

## 2019-10-19 DIAGNOSIS — E538 Deficiency of other specified B group vitamins: Secondary | ICD-10-CM | POA: Diagnosis not present

## 2019-10-31 ENCOUNTER — Encounter: Payer: Self-pay | Admitting: Family Medicine

## 2019-11-01 ENCOUNTER — Other Ambulatory Visit: Payer: Self-pay | Admitting: Family Medicine

## 2019-11-01 DIAGNOSIS — J3089 Other allergic rhinitis: Secondary | ICD-10-CM

## 2019-11-05 ENCOUNTER — Encounter: Payer: Self-pay | Admitting: Family Medicine

## 2019-11-07 ENCOUNTER — Telehealth: Payer: BC Managed Care – PPO | Admitting: Family Medicine

## 2019-11-07 ENCOUNTER — Encounter: Payer: Self-pay | Admitting: Family Medicine

## 2019-11-07 ENCOUNTER — Other Ambulatory Visit: Payer: Self-pay

## 2019-11-28 ENCOUNTER — Ambulatory Visit (INDEPENDENT_AMBULATORY_CARE_PROVIDER_SITE_OTHER): Payer: BC Managed Care – PPO | Admitting: Family Medicine

## 2019-11-28 ENCOUNTER — Encounter: Payer: Self-pay | Admitting: Family Medicine

## 2019-11-28 ENCOUNTER — Other Ambulatory Visit: Payer: Self-pay

## 2019-11-28 VITALS — BP 110/76 | HR 116 | Temp 97.1°F | Resp 16 | Ht 63.0 in | Wt 121.7 lb

## 2019-11-28 DIAGNOSIS — R52 Pain, unspecified: Secondary | ICD-10-CM

## 2019-11-28 DIAGNOSIS — E538 Deficiency of other specified B group vitamins: Secondary | ICD-10-CM | POA: Diagnosis not present

## 2019-11-28 DIAGNOSIS — D649 Anemia, unspecified: Secondary | ICD-10-CM

## 2019-11-28 DIAGNOSIS — M2560 Stiffness of unspecified joint, not elsewhere classified: Secondary | ICD-10-CM | POA: Diagnosis not present

## 2019-11-28 DIAGNOSIS — R700 Elevated erythrocyte sedimentation rate: Secondary | ICD-10-CM | POA: Diagnosis not present

## 2019-11-28 DIAGNOSIS — R768 Other specified abnormal immunological findings in serum: Secondary | ICD-10-CM | POA: Diagnosis not present

## 2019-11-28 DIAGNOSIS — E039 Hypothyroidism, unspecified: Secondary | ICD-10-CM

## 2019-11-28 DIAGNOSIS — R194 Change in bowel habit: Secondary | ICD-10-CM

## 2019-11-28 DIAGNOSIS — D708 Other neutropenia: Secondary | ICD-10-CM

## 2019-11-28 NOTE — Progress Notes (Signed)
Name: Miranda Stafford   MRN: [id number]    DOB: August 01, 1956   Date:11/28/2019       Progress Note  Subjective  Chief Complaint  Chief Complaint  Patient presents with  . Joint Pain    She continues to have joint pain and stiffness x 5-6 months and it is gradually worsening. She has pain in her hands and feet and both turn blue when she is outside on the cold, she has no strength in her hands. She was evaluated for Lupus and she does not have Lupus. She states that she has her good days and bad days and there are days when she can't get out of bed.    HPI  Joint Stiffness: she was seen last Fall because of new onset of joint stiffness, initially was on her hands and legs but was not lasting all day, she had positive ANA and sent to Rheumatologist in [place] further test was done and she was given reassurance. She states she is getting progressively worse, she states sometimes her pain is so intense that she cannot get out of bed. She states hands gets swollen and has to remove her rings. She states stiffness and pain is so intense that she needs to use rails to go down her stairs, also noticing that fingers ant toes get white and very painful when out in the cold, she is always feeling tired. She states joints don't get red or warm. She states not sure what to do   B12 deficiency: we will recheck labs   Hypothyroidism: taking medication as prescribed we will recheck TSH, she takes medication M-F, today she has tachycardia, she has noticed constipation for the past few months.   Change in bowel movements: she states noticed constipation over the past few months , last colonoscopy was done 6 years ago.   Leucopenia and anemia: seen by Dr. Janese Banks in 2018, had bone marrow biopsy and was given reassurance   Patient Active Problem List   Diagnosis Date Noted  . Other neutropenia ([place]) 01/10/2018  . Allergic rhinitis due to allergen 08/03/2017  . B12 deficiency 02/25/2017  . Family history of  breast cancer in sister 05/24/2016  . Hypothyroidism 09/19/2015  . Asthma, moderate persistent, poorly-controlled 09/19/2015  . Perennial allergic rhinitis with seasonal variation 09/19/2015  . Major depression in remission ([place]) 09/19/2015    Past Surgical History:  Procedure Laterality Date  . ABDOMINAL HYSTERECTOMY  2005   fibroid tumor  . ADENOIDECTOMY    . CESAREAN SECTION    . TUBAL LIGATION      Family History  Problem Relation Age of Onset  . Heart disease Mother   . Hypertension Mother   . Hyperlipidemia Mother   . SIDS Son   . Breast cancer Sister 24  . Hypothyroidism Sister   . Hypertension Sister     Social History   Socioeconomic History  . Marital status: Married    Spouse name: Abe People  . Number of children: 2  . Years of education: Not on file  . Highest education level: Professional school degree (e.g., MD, DDS, DVM, JD)  Occupational History  . Occupation: Retired Pharmacist, hospital  Tobacco Use  . Smoking status: Former Smoker    Packs/day: 0.50    Years: 5.00    Pack years: 2.50    Types: Cigarettes    Start date: 11/16/1979    Quit date: 09/18/1985    Years since quitting: 34.2  . Smokeless tobacco: Never Used  Substance  and Sexual Activity  . Alcohol use: Yes    Alcohol/week: 0.0 standard drinks    Comment: wine/socially/rarely  . Drug use: No  . Sexual activity: Yes    Partners: Male  Other Topics Concern  . Not on file  Social History Narrative  . Not on file   Social Determinants of Health   Financial Resource Strain: Low Risk   . Difficulty of Paying Living Expenses: Not hard at all  Food Insecurity: No Food Insecurity  . Worried About Charity fundraiser in the Last Year: Never true  . Ran Out of Food in the Last Year: Never true  Transportation Needs: No Transportation Needs  . Lack of Transportation (Medical): No  . Lack of Transportation (Non-Medical): No  Physical Activity: Insufficiently Active  . Days of Exercise per Week: 5 days   . Minutes of Exercise per Session: 20 min  Stress: No Stress Concern Present  . Feeling of Stress : Not at all  Social Connections: Slightly Isolated  . Frequency of Communication with Friends and Family: More than three times a week  . Frequency of Social Gatherings with Friends and Family: Three times a week  . Attends Religious Services: More than 4 times per year  . Active Member of Clubs or Organizations: No  . Attends Archivist Meetings: Never  . Marital Status: Married  Human resources officer Violence: Not At Risk  . Fear of Current or Ex-Partner: No  . Emotionally Abused: No  . Physically Abused: No  . Sexually Abused: No     Current Outpatient Medications:  .  albuterol (PROVENTIL HFA;VENTOLIN HFA) 108 (90 Base) MCG/ACT inhaler, INHALE 2 PUFFS INTO THE LUNGS EVERY 6 HOURS AS NEEDED FOR WHEEZING OR SHORTNESS OF BREATH, Disp: 6.7 g, Rfl: 0 .  azelastine (ASTELIN) 0.1 % nasal spray, Place 2 sprays into both nostrils 2 (two) times daily. Use in each nostril as directed, Disp: 30 mL, Rfl: 2 .  cyclobenzaprine (FLEXERIL) 5 MG tablet, Take 1 tablet (5 mg total) by mouth 3 (three) times daily as needed for muscle spasms., Disp: 20 tablet, Rfl: 0 .  fluticasone (FLONASE) 50 MCG/ACT nasal spray, Place 2 sprays into both nostrils daily., Disp: , Rfl:  .  fluticasone furoate-vilanterol (BREO ELLIPTA) 100-25 MCG/INH AEPB, Inhale 1 puff into the lungs daily., Disp: 60 each, Rfl: 1 .  montelukast (SINGULAIR) 10 MG tablet, TAKE 1 TABLET(10 MG) BY MOUTH AT BEDTIME, Disp: 30 tablet, Rfl: 5 .  SYNTHROID 88 MCG tablet, Take 1 tablet (88 mcg total) by mouth daily before breakfast. M-F, Disp: 30 tablet, Rfl: 1 .  Vitamin D, Ergocalciferol, (DRISDOL) 1.25 MG (50000 UT) CAPS capsule, Take 1 capsule (50,000 Units total) by mouth every 7 (seven) days., Disp: 12 capsule, Rfl: 1 .  conjugated estrogens (PREMARIN) vaginal cream, Place 1 Applicatorful vaginally daily. (Patient not taking: Reported on  11/28/2019), Disp: 42.5 g, Rfl: 12 .  meloxicam (MOBIC) 15 MG tablet, Take 1 tablet (15 mg total) by mouth daily. (Patient not taking: Reported on 11/28/2019), Disp: 30 tablet, Rfl: 1  Current Facility-Administered Medications:  .  cyanocobalamin ((VITAMIN B-12)) injection 1,000 mcg, 1,000 mcg, Subcutaneous, Q30 days, Steele Sizer, MD, 1,000 mcg at 11/28/19 1021  No Known Allergies  I personally reviewed active problem list, medication list, allergies, family history, social history with the patient/caregiver today.   ROS  Constitutional: Negative for fever or weight change.  Respiratory: Negative for cough and shortness of breath.   Cardiovascular: Negative  for chest pain or palpitations.  Gastrointestinal: Negative for abdominal pain,positive for  bowel changes.  Musculoskeletal:Positive for gait problem or joint swelling.  Skin: Negative for rash.  Neurological: Negative for dizziness , positive for intermittent  headache.  No other specific complaints in a complete review of systems (except as listed in HPI above).  Objective  Vitals:   11/28/19 1018  BP: 110/76  Pulse: (!) 116  Resp: 16  Temp: (!) 97.1 F (36.2 C)  TempSrc: Temporal  SpO2: 99%  Weight: 121 lb 11.2 oz (55.2 kg)  Height: '5\' 3"'$  (1.6 m)    Body mass index is 21.56 kg/m.  Physical Exam  Constitutional: Patient appears well-developed and well-nourished. No distress.  HEENT: head atraumatic, normocephalic, pupils equal and reactive to light, Cardiovascular: Normal rate, regular rhythm and normal heart sounds.  No murmur heard. No BLE edema. Pulmonary/Chest: Effort normal and breath sounds normal. No respiratory distress. Abdominal: Soft.  There is no tenderness. Muscular skeletal: no redness, deformities, or swelling at this time  Psychiatric: Patient has a normal mood and affect. behavior is normal. Judgment and thought content normal.  PHQ2/9: Depression screen [facility] 2/9 11/28/2019 09/06/2019  08/16/2019 07/18/2019 07/03/2019  Decreased Interest 0 0 0 0 0  Down, Depressed, Hopeless 0 0 0 0 0  PHQ - 2 Score 0 0 0 0 0  Altered sleeping 0 0 0 3 0  Tired, decreased energy 0 0 0 3 0  Change in appetite 0 0 0 0 0  Feeling bad or failure about yourself  0 0 0 0 0  Trouble concentrating 0 0 0 1 0  Moving slowly or fidgety/restless 0 0 0 0 0  Suicidal thoughts 0 0 0 0 0  PHQ-9 Score 0 0 0 7 0  Difficult doing work/chores - - Not difficult at all Very difficult Not difficult at all  Some recent data might be hidden    phq 9 is negative   Fall Risk: Fall Risk  11/28/2019 09/06/2019 08/16/2019 07/18/2019 07/03/2019  Falls in the past year? 0 0 0 0 0  Number falls in past yr: 0 0 0 0 0  Injury with Fall? 0 0 0 0 0  Follow up - - - - Falls evaluation completed     Functional Status Survey: Is the patient deaf or have difficulty hearing?: No Does the patient have difficulty seeing, even when wearing glasses/contacts?: No Does the patient have difficulty concentrating, remembering, or making decisions?: No Does the patient have difficulty walking or climbing stairs?: Yes Does the patient have difficulty dressing or bathing?: No Does the patient have difficulty doing errands alone such as visiting a doctor's office or shopping?: No    Assessment & Plan  1. Positive ANA (antinuclear antibody)  - Sedimentation rate - C-reactive protein - ANA,IFA RA Diag Pnl w/rflx Tit/Patn  2. Joint stiffness  - Sedimentation rate - C-reactive protein - ANA,IFA RA Diag Pnl w/rflx Tit/Patn  3. Generalized body aches  - COMPLETE METABOLIC PANEL WITH GFR  4. Elevated sed rate   5. Hypothyroidism, adult  - TSH  6. B12 deficiency  - Vitamin B12  7. Anemia, unspecified type  - CBC with Differential/Platelet  8. Change in bowel habits  - Ambulatory referral to Gastroenterology  9. Other neutropenia ([place])  Seen by hematologist in the past

## 2019-11-29 ENCOUNTER — Other Ambulatory Visit: Payer: Self-pay | Admitting: Family Medicine

## 2019-11-29 ENCOUNTER — Encounter: Payer: Self-pay | Admitting: Family Medicine

## 2019-11-30 LAB — COMPLETE METABOLIC PANEL WITH GFR
AG Ratio: 1.6 (calc) (ref 1.0–2.5)
ALT: 12 U/L (ref 6–29)
AST: 20 U/L (ref 10–35)
Albumin: 4.3 g/dL (ref 3.6–5.1)
Alkaline phosphatase (APISO): 46 U/L (ref 37–153)
BUN: 14 mg/dL (ref 7–25)
CO2: 28 mmol/L (ref 20–32)
Calcium: 9.6 mg/dL (ref 8.6–10.4)
Chloride: 103 mmol/L (ref 98–110)
Creat: 0.78 mg/dL (ref 0.50–0.99)
GFR, Est African American: 95 mL/min/{1.73_m2} (ref >=60)
GFR, Est Non African American: 82 mL/min/{1.73_m2} (ref >=60)
Globulin: 2.7 g/dL (calc) (ref 1.9–3.7)
Glucose, Bld: 93 mg/dL (ref 65–99)
Potassium: 4 mmol/L (ref 3.5–5.3)
Sodium: 138 mmol/L (ref 135–146)
Total Bilirubin: 0.4 mg/dL (ref 0.2–1.2)
Total Protein: 7 g/dL (ref 6.1–8.1)

## 2019-11-30 LAB — CBC WITH DIFFERENTIAL/PLATELET
Absolute Monocytes: 263 cells/uL (ref 200–950)
Basophils Absolute: 30 cells/uL (ref 0–200)
Basophils Relative: 0.8 %
Eosinophils Absolute: 333 cells/uL (ref 15–500)
Eosinophils Relative: 9 %
HCT: 33.1 % — ABNORMAL LOW (ref 35.0–45.0)
Hemoglobin: 10.4 g/dL — ABNORMAL LOW (ref 11.7–15.5)
Lymphs Abs: 1713 cells/uL (ref 850–3900)
MCH: 25.4 pg — ABNORMAL LOW (ref 27.0–33.0)
MCHC: 31.4 g/dL — ABNORMAL LOW (ref 32.0–36.0)
MCV: 80.7 fL (ref 80.0–100.0)
MPV: 10.5 fL (ref 7.5–12.5)
Monocytes Relative: 7.1 %
Neutro Abs: 1362 cells/uL — ABNORMAL LOW (ref 1500–7800)
Neutrophils Relative %: 36.8 %
Platelets: 223 10*3/uL (ref 140–400)
RBC: 4.1 10*6/uL (ref 3.80–5.10)
RDW: 13.5 % (ref 11.0–15.0)
Total Lymphocyte: 46.3 %
WBC: 3.7 10*3/uL — ABNORMAL LOW (ref 3.8–10.8)

## 2019-11-30 LAB — ANA,IFA RA DIAG PNL W/RFLX TIT/PATN
Anti Nuclear Antibody (ANA): POSITIVE — AB
Cyclic Citrullin Peptide Ab: <16 UNITS
Rheumatoid fact SerPl-aCnc: <14 IU/mL (ref <14)

## 2019-11-30 LAB — SEDIMENTATION RATE: Sed Rate: 50 mm/h — ABNORMAL HIGH (ref 0–30)

## 2019-11-30 LAB — ANTI-NUCLEAR AB-TITER (ANA TITER)
ANA TITER: 1:80 {titer} — ABNORMAL HIGH
ANA Titer 1: 1:80 {titer} — ABNORMAL HIGH

## 2019-11-30 LAB — VITAMIN B12: Vitamin B-12: >2000 pg/mL — ABNORMAL HIGH (ref 200–1100)

## 2019-11-30 LAB — TSH: TSH: 0.78 mIU/L (ref 0.40–4.50)

## 2019-11-30 LAB — C-REACTIVE PROTEIN: CRP: 12.7 mg/L — ABNORMAL HIGH (ref <8.0)

## 2019-12-13 IMAGING — CT CT ANGIO CHEST
2 of 6 series · 19 of 46 positions shown · IV contrast (APPLIED)
Comparison: Plain film from earlier in the same day, CT from
09/06/2010.

CLINICAL DATA: Right-sided chest pain

EXAM:
CT ANGIOGRAPHY CHEST WITH CONTRAST
TECHNIQUE: Multidetector CT imaging of the chest was performed using the
standard protocol during bolus administration of intravenous
contrast. Multiplanar CT image reconstructions and MIPs were
obtained to evaluate the vascular anatomy.
CONTRAST:  75mL OMNIPAQUE IOHEXOL 350 MG/ML SOLN

[Series 5: thins · axial · 0.56mm/px · z∈[-220,+20]mm · 16 of 265 slices shown]
[im 12/265  lung]
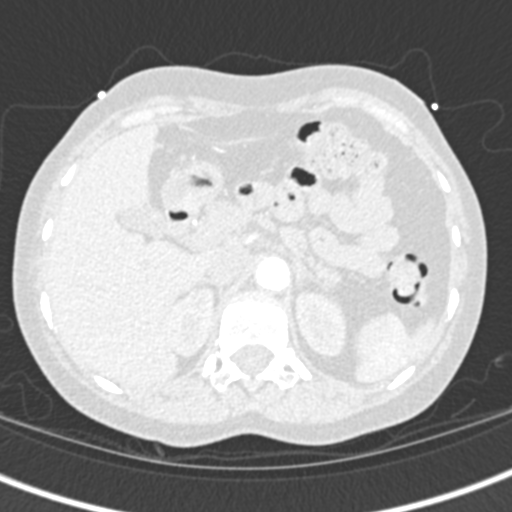
[im 35/265  soft-tissue]
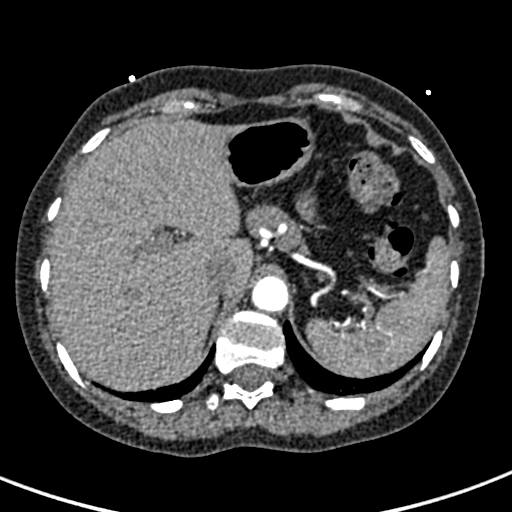
[im 46/265  lung]
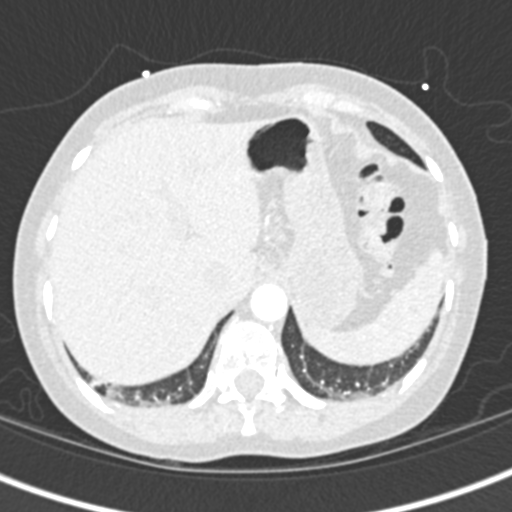
[im 58/265  soft-tissue]
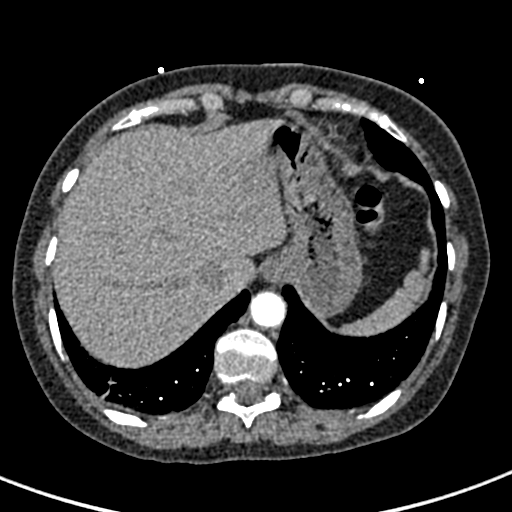
[im 81/265  lung]
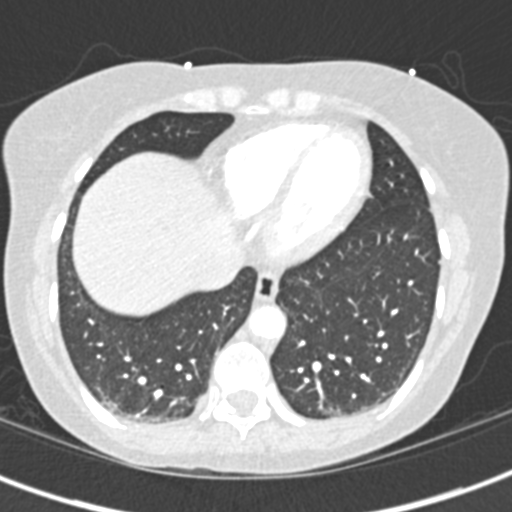
[im 92/265  soft-tissue]
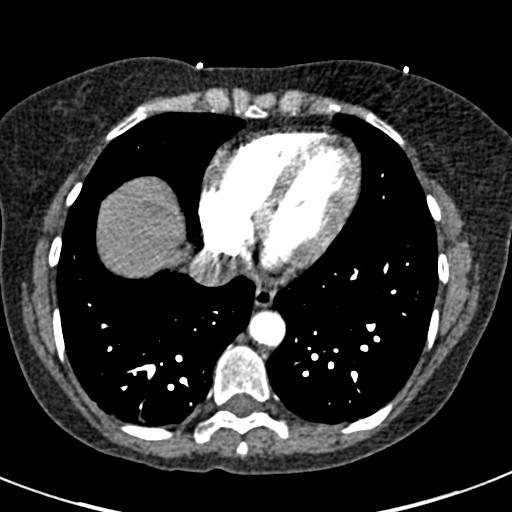
[im 104/265  lung]
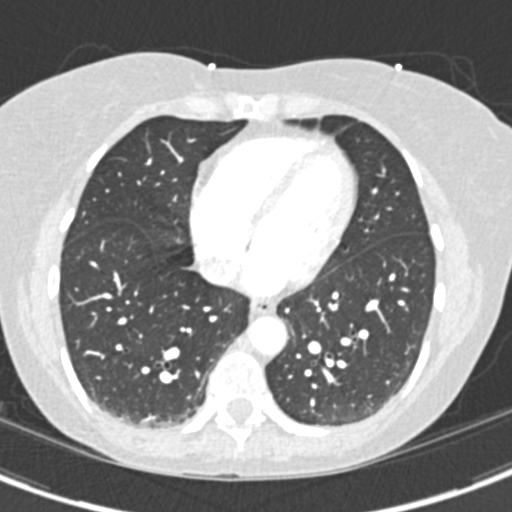
[im 127/265  soft-tissue]
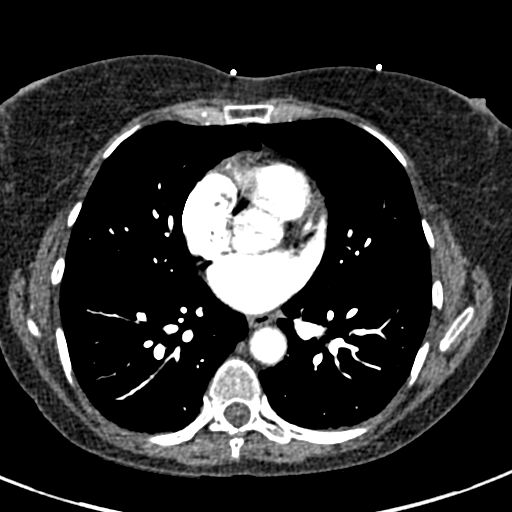
[im 138/265  lung]
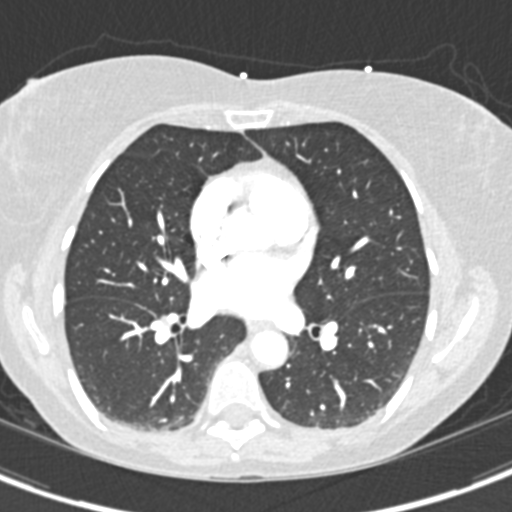
[im 161/265  soft-tissue]
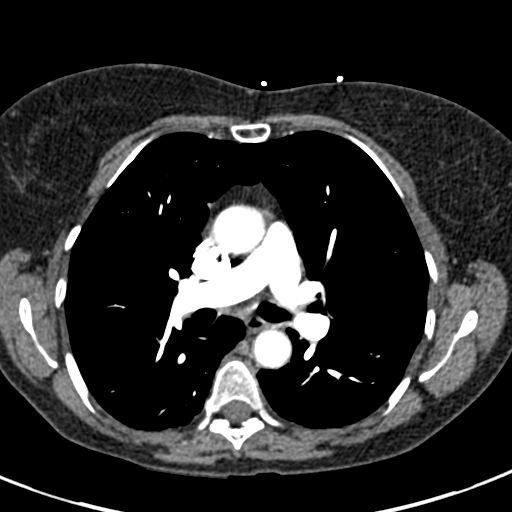
[im 173/265  lung]
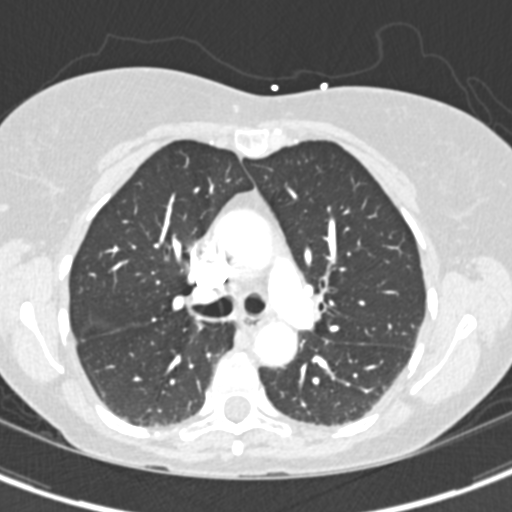
[im 184/265  soft-tissue]
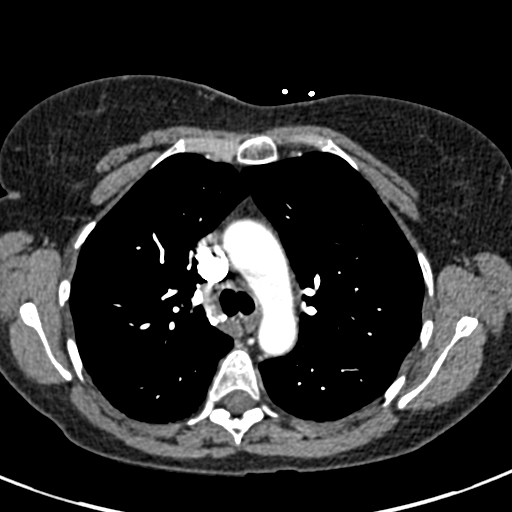
[im 207/265  lung]
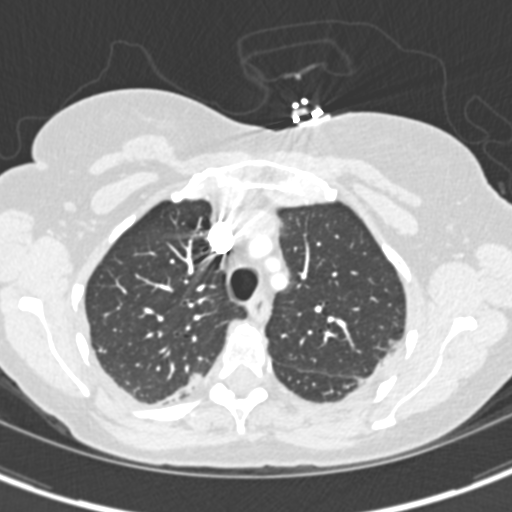
[im 219/265  soft-tissue]
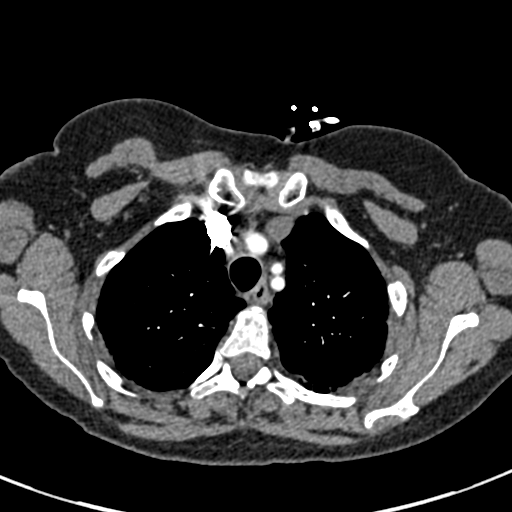
[im 230/265  lung]
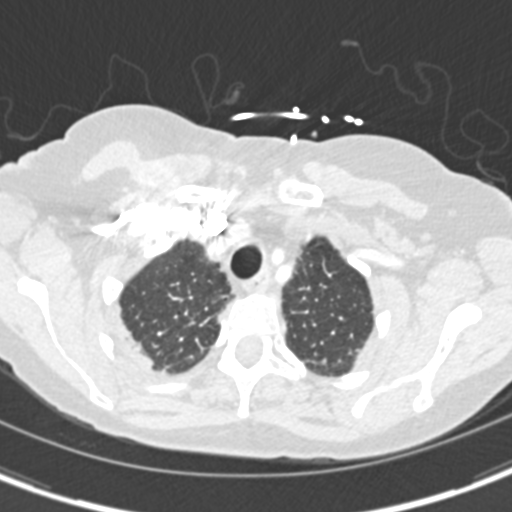
[im 253/265  soft-tissue]
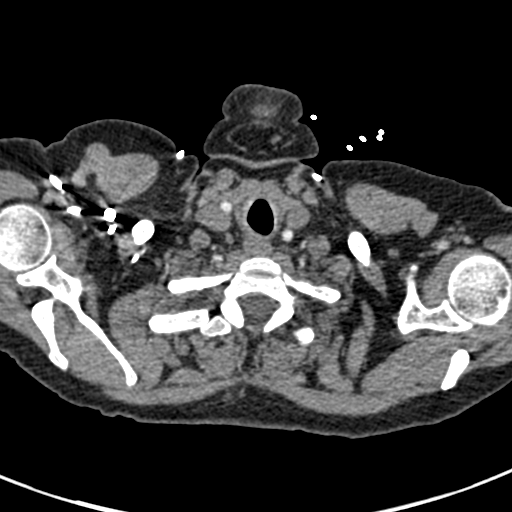

[Series 7: coronal mpr · coronal · 0.54mm/px · 3 of 81 slices shown]
[im 21/81  soft-tissue]
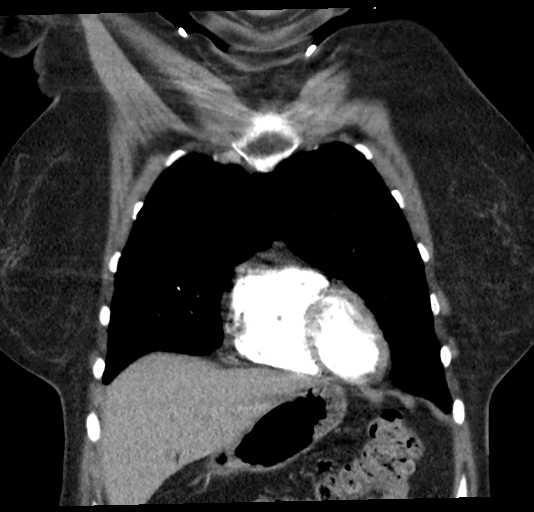
[im 41/81  soft-tissue]
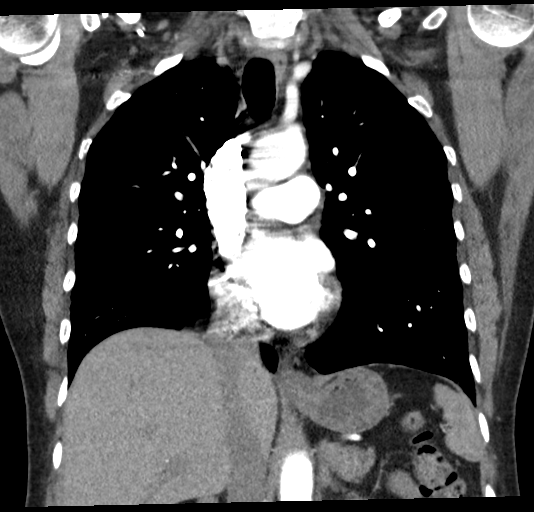
[im 61/81  soft-tissue]
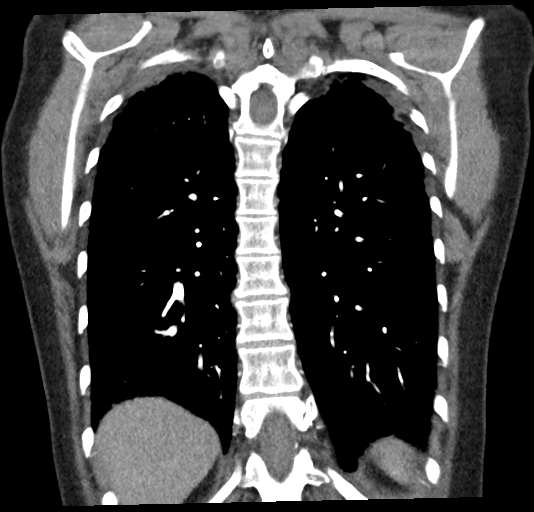

[19 of 46 positions shown; findings below may reference images not displayed]

FINDINGS: Cardiovascular: Thoracic aorta shows a truncus anomaly. No
aneurysmal dilatation is noted. No atherosclerotic calcifications or
dissection is seen. No cardiac enlargement is noted. No coronary
calcifications are noted. The pulmonary artery shows a normal
branching pattern. No filling defects to suggest pulmonary emboli
are identified.

Mediastinum/Nodes: Esophagus is within normal limits. Thoracic inlet
is unremarkable. No sizable hilar or mediastinal adenopathy is
noted.

Lungs/Pleura: Biapical pleural and parenchymal scarring is seen
similar to that noted on chest x-ray. 5 mm subpleural nodule is
noted in the right middle lobe and stable from the previous exam of
3933 consistent with a benign etiology. No focal infiltrate or
sizable effusion is seen.

Upper Abdomen: Visualized upper abdomen shows no acute abnormality.

Musculoskeletal: No chest wall abnormality. No acute or significant
osseous findings.

Review of the MIP images confirms the above findings.
IMPRESSION: No evidence of pulmonary emboli.

Stable 5 mm subpleural nodule in the right middle lobe from 3933
consistent with a benign etiology.

No other focal abnormality is noted.

## 2019-12-19 ENCOUNTER — Ambulatory Visit (INDEPENDENT_AMBULATORY_CARE_PROVIDER_SITE_OTHER): Payer: BC Managed Care – PPO | Admitting: Gastroenterology

## 2019-12-19 ENCOUNTER — Encounter: Payer: Self-pay | Admitting: Gastroenterology

## 2019-12-19 DIAGNOSIS — K5901 Slow transit constipation: Secondary | ICD-10-CM | POA: Diagnosis not present

## 2019-12-19 DIAGNOSIS — M359 Systemic involvement of connective tissue, unspecified: Secondary | ICD-10-CM | POA: Insufficient documentation

## 2019-12-19 NOTE — Progress Notes (Signed)
[place], MD [address]  [place]  [place], [place] [address]  Main: [phone]  Fax: [phone]    Gastroenterology Consultation Video Visit  Referring Provider:     Steele Sizer, MD Primary Care Physician:  Steele Sizer, MD Primary Gastroenterologist:  Dr. Cephas Darby Reason for Consultation: Constipation        HPI:   Miranda Stafford is a 64 y.o. female referred by Dr. Steele Sizer, MD  for consultation & management of constipation  Virtual Visit Video Note  I connected with Miranda Stafford on 12/19/19 at  8:30 AM EST by video and verified that I am speaking with the correct person using two identifiers.   I discussed the limitations, risks, security and privacy concerns of performing an evaluation and management service by video and the availability of in person appointments. I also discussed with the patient that there may be a patient responsible charge related to this service. The patient expressed understanding and agreed to proceed.  Location of the Patient: Home  Location of the provider: Office  Persons participating in the visit: Patient and provider only   History of Present Illness: Miranda Stafford has history of hypothyroidism, hypertension, hyperlipidemia is referred for further evaluation of constipation.  Patient reports that she is undergoing rheumatologic work-up for her positive ANA serologies.  And has been started on Flexeril by Dr. Ancil Boozer in 06/2019.  Since then, she said she has been experiencing severe constipation although her stools are soft, she does report fullness in her stomach, incomplete emptying, abdominal bloating, associated with significant straining.  She has bowel movements about 3-4 times a week.  She denies rectal bleeding. She has mild chronic normocytic anemia, with no evidence of iron deficiency or B12/folate deficiency Does not smoke or drink alcohol  NSAIDs: None  Antiplts/Anticoagulants/Anti thrombotics:  None  GI Procedures: Colonoscopy in 06/25/2013 normal She denies family history of GI malignancy  Past Medical History:  Diagnosis Date  . Allergy   . Anemia   . Asthma   . Fibroid tumor   . Herpes zoster   . Hyperlipidemia   . Hypertension   . Hypothyroidism   . Insomnia   . Osteopenia   . Thyroid disease     Past Surgical History:  Procedure Laterality Date  . ABDOMINAL HYSTERECTOMY  2005   fibroid tumor  . ADENOIDECTOMY    . CESAREAN SECTION    . TUBAL LIGATION      Current Outpatient Medications:  .  albuterol (PROVENTIL HFA;VENTOLIN HFA) 108 (90 Base) MCG/ACT inhaler, INHALE 2 PUFFS INTO THE LUNGS EVERY 6 HOURS AS NEEDED FOR WHEEZING OR SHORTNESS OF BREATH, Disp: 6.7 g, Rfl: 0 .  azelastine (ASTELIN) 0.1 % nasal spray, Place 2 sprays into both nostrils 2 (two) times daily. Use in each nostril as directed, Disp: 30 mL, Rfl: 2 .  cyclobenzaprine (FLEXERIL) 5 MG tablet, Take 1 tablet (5 mg total) by mouth 3 (three) times daily as needed for muscle spasms., Disp: 20 tablet, Rfl: 0 .  montelukast (SINGULAIR) 10 MG tablet, TAKE 1 TABLET(10 MG) BY MOUTH AT BEDTIME, Disp: 30 tablet, Rfl: 5 .  SYNTHROID 88 MCG tablet, TAKE 1 TABLET BY MOUTH DAILY BEFORE BREAKFAST ON MONDAY THROUGH FRIDAY, Disp: 90 tablet, Rfl: 0 .  Vitamin D, Ergocalciferol, (DRISDOL) 1.25 MG (50000 UT) CAPS capsule, Take 1 capsule (50,000 Units total) by mouth every 7 (seven) days., Disp: 12 capsule, Rfl: 1 .  fluticasone (FLONASE) 50 MCG/ACT nasal  spray, Place 2 sprays into both nostrils daily., Disp: , Rfl:  .  fluticasone furoate-vilanterol (BREO ELLIPTA) 100-25 MCG/INH AEPB, Inhale 1 puff into the lungs daily. (Patient not taking: Reported on 12/19/2019), Disp: 60 each, Rfl: 1  Current Facility-Administered Medications:  .  cyanocobalamin ((VITAMIN B-12)) injection 1,000 mcg, 1,000 mcg, Subcutaneous, Q30 days, Steele Sizer, MD, 1,000 mcg at 11/28/19 1021    Family History  Problem Relation Age of Onset   . Heart disease Mother   . Hypertension Mother   . Hyperlipidemia Mother   . SIDS Son   . Breast cancer Sister 76  . Hypothyroidism Sister   . Hypertension Sister      Social History   Tobacco Use  . Smoking status: Former Smoker    Packs/day: 0.50    Years: 5.00    Pack years: 2.50    Types: Cigarettes    Start date: 11/16/1979    Quit date: 09/18/1985    Years since quitting: 34.2  . Smokeless tobacco: Never Used  Substance Use Topics  . Alcohol use: Yes    Alcohol/week: 0.0 standard drinks    Comment: wine/socially/rarely  . Drug use: No    Allergies as of 12/19/2019 - Review Complete 12/19/2019  Allergen Reaction Noted  . Nsaids  11/28/2019    Imaging Studies: Reviewed  Assessment and Plan:   Miranda Stafford is a 64 y.o. female with hypothyroidism, hypertension, hyperlipidemia is seen in consultation for 3 to 46-month history of constipation.  Patient started on Flexeril in 06/2019, most likely her constipation is medication induced.  TSH normal.  She is undergoing rheumatologic work-up for musculoskeletal pain and positive ANA.  I have advised her to discuss with Dr. Ancil Boozer about discontinuation of Flexeril and switch to short course of prednisone for her joint pains.  If her constipation is still not improved after stopping Flexeril, next step would be to try Linzess and perform colonoscopy  Patient expressed understanding of the plan   Follow Up Instructions:   I discussed the assessment and treatment plan with the patient. The patient was provided an opportunity to ask questions and all were answered. The patient agreed with the plan and demonstrated an understanding of the instructions.   The patient was advised to call back or seek an in-person evaluation if the symptoms worsen or if the condition fails to improve as anticipated.  I provided 15 minutes of face-to-face time during this encounter.   Follow up in 4 to 6 weeks   Cephas Darby, MD

## 2020-01-09 ENCOUNTER — Ambulatory Visit: Payer: BC Managed Care – PPO

## 2020-01-10 ENCOUNTER — Ambulatory Visit: Payer: BC Managed Care – PPO | Attending: Internal Medicine

## 2020-01-10 DIAGNOSIS — Z23 Encounter for immunization: Secondary | ICD-10-CM | POA: Insufficient documentation

## 2020-01-10 NOTE — Progress Notes (Signed)
   Covid-19 Vaccination Clinic  Name:  Miranda Stafford    MRN: [id number] DOB: September 09, 1956  01/10/2020  [name] was observed post Covid-19 immunization for 15 minutes without incidence. She was provided with Vaccine Information Sheet and instruction to access the V-Safe system.   [name] was instructed to call 911 with any severe reactions post vaccine: Marland Kitchen Difficulty breathing  . Swelling of your face and throat  . A fast heartbeat  . A bad rash all over your body  . Dizziness and weakness    Immunizations Administered    Name Date Dose VIS Date Route   Pfizer COVID-19 Vaccine 01/10/2020  9:35 AM 0.3 mL 10/26/2019 Intramuscular   Manufacturer: [place]   Lot: [id number]   [place]: [id number]

## 2020-01-20 ENCOUNTER — Other Ambulatory Visit: Payer: Self-pay | Admitting: Family Medicine

## 2020-01-20 DIAGNOSIS — S161XXA Strain of muscle, fascia and tendon at neck level, initial encounter: Secondary | ICD-10-CM

## 2020-01-20 NOTE — Telephone Encounter (Signed)
Requested medication (s) are due for refill today: yes  Requested medication (s) are on the active medication list: yes  Last refill:  07/03/19  Future visit scheduled: no  Notes to clinic:  medication not delegated to NT to refill   Requested Prescriptions  Pending Prescriptions Disp Refills   cyclobenzaprine (FLEXERIL) 5 MG tablet [Pharmacy Med Name: CYCLOBENZAPRINE 5MG  TABLETS] 20 tablet 0    Sig: TAKE 1 TABLET(5 MG) BY MOUTH THREE TIMES DAILY AS NEEDED FOR MUSCLE SPASMS      Not Delegated - Analgesics:  Muscle Relaxants Failed - 01/20/2020 10:18 AM      Failed - This refill cannot be delegated      Passed - Valid encounter within last 6 months    Recent Outpatient Visits           1 month ago Positive ANA (antinuclear antibody)   [facility] Steele Sizer, MD   4 months ago Joint stiffness   [facility] Steele Sizer, MD   5 months ago Positive ANA (antinuclear antibody)   [facility] Steele Sizer, MD   6 months ago Hypothyroidism, adult   [facility] Steele Sizer, MD   6 months ago Strain of neck muscle, initial encounter   [place], [place]       Future Appointments             In 2 weeks Vanga, Tally Due, MD [place]

## 2020-02-05 ENCOUNTER — Ambulatory Visit: Payer: BC Managed Care – PPO | Attending: Internal Medicine

## 2020-02-05 ENCOUNTER — Ambulatory Visit: Payer: BC Managed Care – PPO | Admitting: Gastroenterology

## 2020-02-05 DIAGNOSIS — Z23 Encounter for immunization: Secondary | ICD-10-CM

## 2020-02-05 NOTE — Progress Notes (Signed)
   Covid-19 Vaccination Clinic  Name:  Malika Greiff    MRN: [id number] DOB: [birth year]  02/05/2020  Ms. Cerami was observed post Covid-19 immunization for 15 minutes without incident. She was provided with Vaccine Information Sheet and instruction to access the V-Safe system.   Ms. Claywell was instructed to call 911 with any severe reactions post vaccine: Marland Kitchen Difficulty breathing  . Swelling of face and throat  . A fast heartbeat  . A bad rash all over body  . Dizziness and weakness   Immunizations Administered    Name Date Dose VIS Date Route   Pfizer COVID-19 Vaccine 02/05/2020  9:10 AM 0.3 mL 10/26/2019 Intramuscular   Manufacturer: [place]   Lot: [id number]   [place]: [id number]

## 2020-02-11 ENCOUNTER — Other Ambulatory Visit: Payer: Self-pay | Admitting: Family Medicine

## 2020-02-11 DIAGNOSIS — Z1239 Encounter for other screening for malignant neoplasm of breast: Secondary | ICD-10-CM

## 2020-02-11 DIAGNOSIS — Z01419 Encounter for gynecological examination (general) (routine) without abnormal findings: Secondary | ICD-10-CM

## 2020-02-20 ENCOUNTER — Encounter: Payer: Self-pay | Admitting: Otolaryngology

## 2020-02-20 ENCOUNTER — Other Ambulatory Visit: Payer: Self-pay

## 2020-02-26 ENCOUNTER — Other Ambulatory Visit: Payer: Self-pay

## 2020-02-26 ENCOUNTER — Other Ambulatory Visit
Admission: RE | Admit: 2020-02-26 | Discharge: 2020-02-26 | Disposition: A | Discharge status: Home / Self Care | Payer: BC Managed Care – PPO | Source: Ambulatory Visit | Attending: Otolaryngology | Admitting: Otolaryngology

## 2020-02-26 DIAGNOSIS — Z01812 Encounter for preprocedural laboratory examination: Secondary | ICD-10-CM | POA: Insufficient documentation

## 2020-02-26 DIAGNOSIS — Z20822 Contact with and (suspected) exposure to covid-19: Secondary | ICD-10-CM | POA: Insufficient documentation

## 2020-02-26 LAB — SARS CORONAVIRUS 2 (TAT 6-24 HRS): SARS Coronavirus 2: NEGATIVE

## 2020-02-27 NOTE — Discharge Instructions (Signed)
[facility] ENDOSCOPIC SINUS SURGERY [facility]  What is Functional Endoscopic Sinus Surgery?  The Surgery involves making the natural openings of the sinuses larger by removing the bony partitions that separate the sinuses from the nasal cavity.  The natural sinus lining is preserved as much as possible to allow the sinuses to resume normal function after the surgery.  In some patients nasal polyps (excessively swollen lining of the sinuses) may be removed to relieve obstruction of the sinus openings.  The surgery is performed through the nose using lighted scopes, which eliminates the need for incisions on the face.  A septoplasty is a different procedure which is sometimes performed with sinus surgery.  It involves straightening the boy partition that separates the two sides of your nose.  A crooked or deviated septum may need repair if is obstructing the sinuses or nasal airflow.  Turbinate reduction is also often performed during sinus surgery.  The turbinates are bony proturberances from the side walls of the nose which swell and can obstruct the nose in patients with sinus and allergy problems.  Their size can be surgically reduced to help relieve nasal obstruction.  What Can Sinus Surgery Do For Me?  Sinus surgery can reduce the frequency of sinus infections requiring antibiotic treatment.  This can provide improvement in nasal congestion, post-nasal drainage, facial pressure and nasal obstruction.  Surgery will NOT prevent you from ever having an infection again, so it usually only for patients who get infections 4 or more times yearly requiring antibiotics, or for infections that do not clear with antibiotics.  It will not cure nasal allergies, so patients with allergies may still require medication to treat their allergies after surgery. Surgery may improve headaches related to sinusitis, however, some people will continue to  require medication to control sinus headaches related to allergies.  Surgery will do nothing for other forms of headache (migraine, tension or cluster).  What Are the Risks of Endoscopic Sinus Surgery?  Current techniques allow surgery to be performed safely with little risk, however, there are rare complications that patients should be aware of.  Because the sinuses are located around the eyes, there is risk of eye injury, including blindness, though again, this would be quite rare. This is usually a result of bleeding behind the eye during surgery, which puts the vision oat risk, though there are treatments to protect the vision and prevent permanent disrupted by surgery causing a leak of the spinal fluid that surrounds the brain.  More serious complications would include bleeding inside the brain cavity or damage to the brain.  Again, all of these complications are uncommon, and spinal fluid leaks can be safely managed surgically if they occur.  The most common complication of sinus surgery is bleeding from the nose, which may require packing or cauterization of the nose.  Continued sinus have polyps may experience recurrence of the polyps requiring revision surgery.  Alterations of sense of smell or injury to the tear ducts are also rare complications.   What is the Surgery Like, and what is the Recovery?  The Surgery usually takes a couple of hours to perform, and is usually performed under a general anesthetic (completely asleep).  Patients are usually discharged home after a couple of hours.  Sometimes during surgery it is necessary to pack the nose to control bleeding, and the packing is left in place for 24 - 48 hours, and removed by your surgeon.    If a septoplasty was performed during the procedure, there is often a splint placed which must be removed after 5-7 days.   Discomfort: Pain is usually mild to moderate, and can be controlled by prescription pain medication or acetaminophen (Tylenol).   Aspirin, Ibuprofen (Advil, Motrin), or Naprosyn (Aleve) should be avoided, as they can cause increased bleeding.  Most patients feel sinus pressure like they have a bad head cold for several days.  Sleeping with your head elevated can help reduce swelling and facial pressure, as can ice packs over the face.  A humidifier may be helpful to keep the mucous and blood from drying in the nose.   Diet: There are no specific diet restrictions, however, you should generally start with clear liquids and a light diet of bland foods because the anesthetic can cause some nausea.  Advance your diet depending on how your stomach feels.  Taking your pain medication with food will often help reduce stomach upset which pain medications can cause.  Nasal Saline Irrigation: It is important to remove blood clots and dried mucous from the nose as it is healing.  This is done by having you irrigate the nose at least 3 - 4 times daily with a salt water solution.  We recommend using NeilMed Sinus Rinse (available at the drug store).  Fill the squeeze bottle with the solution, bend over a sink, and insert the tip of the squeeze bottle into the nose  of an inch.  Point the tip of the squeeze bottle towards the inside corner of the eye on the same side your irrigating.  Squeeze the bottle and gently irrigate the nose.  If you bend forward as you do this, most of the fluid will flow back out of the nose, instead of down your throat.   The solution should be warm, near body temperature, when you irrigate.   Each time you irrigate, you should use a full squeeze bottle.   Note that if you are instructed to use Nasal Steroid Sprays at any time after your surgery, irrigate with saline BEFORE using the steroid spray, so you do not wash it all out of the nose. Another product, Nasal Saline Gel (such as AYR Nasal Saline Gel) can be applied in each nostril 3 - 4 times daily to moisture the nose and reduce scabbing or crusting.  Bleeding:   Bloody drainage from the nose can be expected for several days, and patients are instructed to irrigate their nose frequently with salt water to help remove mucous and blood clots.  The drainage may be dark red or brown, though some fresh blood may be seen intermittently, especially after irrigation.  Do not blow you nose, as bleeding may occur. If you must sneeze, keep your mouth open to allow air to escape through your mouth.  If heavy bleeding occurs: Irrigate the nose with saline to rinse out clots, then spray the nose 3 - 4 times with Afrin Nasal Decongestant Spray.  The spray will constrict the blood vessels to slow bleeding.  Pinch the lower half of your nose shut to apply pressure, and lay down with your head elevated.  Ice packs over the nose may help as well. If bleeding persists despite these measures, you should notify your doctor.  Do not use the Afrin routinely to control nasal congestion after surgery, as it can result in worsening congestion and may affect healing.     Activity: Return to work varies among patients. Most patients will be   out of work at least 5 - 7 days to recover.  Patient may return to work after they are off of narcotic pain medication, and feeling well enough to perform the functions of their job.  Patients must avoid heavy lifting (over 10 pounds) or strenuous physical for 2 weeks after surgery, so your employer may need to assign you to light duty, or keep you out of work longer if light duty is not possible.  NOTE: you should not drive, operate dangerous machinery, do any mentally demanding tasks or make any important legal or financial decisions while on narcotic pain medication and recovering from the general anesthetic.    Call Your Doctor Immediately if You Have Any of the Following: 1. Bleeding that you cannot control with the above measures 2. Loss of vision, double vision, bulging of the eye or black eyes. 3. Fever over 101 degrees 4. Neck stiffness with  severe headache, fever, nausea and change in mental state. You are always encourage to call anytime with concerns, however, please call with requests for pain medication refills during office hours.  Office Endoscopy: During follow-up visits your doctor will remove any packing or splints that may have been placed and evaluate and clean your sinuses endoscopically.  Topical anesthetic will be used to make this as comfortable as possible, though you may want to take your pain medication prior to the visit.  How often this will need to be done varies from patient to patient.  After complete recovery from the surgery, you may need follow-up endoscopy from time to time, particularly if there is concern of recurrent infection or nasal polyps.  General Anesthesia, Adult, Care After This sheet gives you information about how to care for yourself after your procedure. Your health care provider may also give you more specific instructions. If you have problems or questions, contact your health care provider. What can I expect after the procedure? After the procedure, the following side effects are common:  Pain or discomfort at the IV site.  Nausea.  Vomiting.  Sore throat.  Trouble concentrating.  Feeling cold or chills.  Weak or tired.  Sleepiness and fatigue.  Soreness and body aches. These side effects can affect parts of the body that were not involved in surgery. Follow these instructions at home:  For at least 24 hours after the procedure:  Have a responsible adult stay with you. It is important to have someone help care for you until you are awake and alert.  Rest as needed.  Do not: ? Participate in activities in which you could fall or become injured. ? Drive. ? Use heavy machinery. ? Drink alcohol. ? Take sleeping pills or medicines that cause drowsiness. ? Make important decisions or sign legal documents. ? Take care of children on your own. Eating and drinking  Follow  any instructions from your health care provider about eating or drinking restrictions.  When you feel hungry, start by eating small amounts of foods that are soft and easy to digest (bland), such as toast. Gradually return to your regular diet.  Drink enough fluid to keep your urine pale yellow.  If you vomit, rehydrate by drinking water, juice, or clear broth. General instructions  If you have sleep apnea, surgery and certain medicines can increase your risk for breathing problems. Follow instructions from your health care provider about wearing your sleep device: ? Anytime you are sleeping, including during daytime naps. ? While taking prescription pain medicines, sleeping medicines, or medicines that   make you drowsy.  Return to your normal activities as told by your health care provider. Ask your health care provider what activities are safe for you.  Take over-the-counter and prescription medicines only as told by your health care provider.  If you smoke, do not smoke without supervision.  Keep all follow-up visits as told by your health care provider. This is important. Contact a health care provider if:  You have nausea or vomiting that does not get better with medicine.  You cannot eat or drink without vomiting.  You have pain that does not get better with medicine.  You are unable to pass urine.  You develop a skin rash.  You have a fever.  You have redness around your IV site that gets worse. Get help right away if:  You have difficulty breathing.  You have chest pain.  You have blood in your urine or stool, or you vomit blood. Summary  After the procedure, it is common to have a sore throat or nausea. It is also common to feel tired.  Have a responsible adult stay with you for the first 24 hours after general anesthesia. It is important to have someone help care for you until you are awake and alert.  When you feel hungry, start by eating small amounts of  foods that are soft and easy to digest (bland), such as toast. Gradually return to your regular diet.  Drink enough fluid to keep your urine pale yellow.  Return to your normal activities as told by your health care provider. Ask your health care provider what activities are safe for you. This information is not intended to replace advice given to you by your health care provider. Make sure you discuss any questions you have with your health care provider. Document Revised: 11/04/2017 Document Reviewed: 06/17/2017 Elsevier Patient Education  2020 [employer]  

## 2020-02-28 ENCOUNTER — Ambulatory Visit: Payer: BC Managed Care – PPO | Admitting: Anesthesiology

## 2020-02-28 ENCOUNTER — Encounter
Admission: RE | Disposition: A | Discharge status: Home / Self Care | Payer: Self-pay | Source: Home / Self Care | Attending: Otolaryngology

## 2020-02-28 ENCOUNTER — Encounter: Payer: Self-pay | Admitting: Otolaryngology

## 2020-02-28 ENCOUNTER — Ambulatory Visit
Admission: RE | Admit: 2020-02-28 | Discharge: 2020-02-28 | Disposition: A | Discharge status: Home / Self Care | Payer: BC Managed Care – PPO | Attending: Otolaryngology | Admitting: Otolaryngology

## 2020-02-28 ENCOUNTER — Other Ambulatory Visit: Payer: Self-pay

## 2020-02-28 DIAGNOSIS — Z7952 Long term (current) use of systemic steroids: Secondary | ICD-10-CM | POA: Insufficient documentation

## 2020-02-28 DIAGNOSIS — Z87891 Personal history of nicotine dependence: Secondary | ICD-10-CM | POA: Diagnosis not present

## 2020-02-28 DIAGNOSIS — J338 Other polyp of sinus: Secondary | ICD-10-CM | POA: Insufficient documentation

## 2020-02-28 DIAGNOSIS — E039 Hypothyroidism, unspecified: Secondary | ICD-10-CM | POA: Diagnosis not present

## 2020-02-28 DIAGNOSIS — Z7951 Long term (current) use of inhaled steroids: Secondary | ICD-10-CM | POA: Diagnosis not present

## 2020-02-28 DIAGNOSIS — Z79890 Hormone replacement therapy (postmenopausal): Secondary | ICD-10-CM | POA: Diagnosis not present

## 2020-02-28 DIAGNOSIS — J45909 Unspecified asthma, uncomplicated: Secondary | ICD-10-CM | POA: Insufficient documentation

## 2020-02-28 DIAGNOSIS — J324 Chronic pansinusitis: Secondary | ICD-10-CM | POA: Insufficient documentation

## 2020-02-28 DIAGNOSIS — J339 Nasal polyp, unspecified: Secondary | ICD-10-CM | POA: Diagnosis not present

## 2020-02-28 DIAGNOSIS — Z791 Long term (current) use of non-steroidal anti-inflammatories (NSAID): Secondary | ICD-10-CM | POA: Diagnosis not present

## 2020-02-28 HISTORY — PX: ETHMOIDECTOMY: SHX5197

## 2020-02-28 HISTORY — PX: FRONTAL SINUS EXPLORATION: SHX6591

## 2020-02-28 HISTORY — PX: MAXILLARY ANTROSTOMY: SHX2003

## 2020-02-28 HISTORY — DX: Other complications of anesthesia, initial encounter: T88.59XA

## 2020-02-28 HISTORY — DX: Presence of dental prosthetic device (complete) (partial): Z97.2

## 2020-02-28 HISTORY — DX: Unspecified osteoarthritis, unspecified site: M19.90

## 2020-02-28 HISTORY — PX: IMAGE GUIDED SINUS SURGERY: SHX6570

## 2020-02-28 HISTORY — PX: SPHENOIDECTOMY: SHX2421

## 2020-02-28 SURGERY — SINUS SURGERY, WITH IMAGING GUIDANCE
Anesthesia: General | Site: Nose

## 2020-02-28 MED ORDER — ACETAMINOPHEN 160 MG/5ML PO SOLN: 325.0000 mg | ORAL | Status: DC | PRN

## 2020-02-28 MED ORDER — ACETAMINOPHEN 325 MG PO TABS: 325.0000 mg | ORAL_TABLET | ORAL | Status: DC | PRN

## 2020-02-28 MED ORDER — OXYMETAZOLINE HCL 0.05 % NA SOLN
NASAL | Status: DC | PRN
  Administered 2020-02-28: 1 via TOPICAL

## 2020-02-28 MED ORDER — LACTATED RINGERS IV SOLN: INTRAVENOUS | Status: DC

## 2020-02-28 MED ORDER — SUCCINYLCHOLINE CHLORIDE 20 MG/ML IJ SOLN
INTRAMUSCULAR | Status: DC | PRN
  Administered 2020-02-28 (×2): 80 mg via INTRAVENOUS

## 2020-02-28 MED ORDER — OXYCODONE HCL 5 MG PO TABS: 5.0000 mg | ORAL_TABLET | Freq: Once | ORAL | Status: AC | PRN

## 2020-02-28 MED ORDER — GLYCOPYRROLATE 0.2 MG/ML IJ SOLN
INTRAMUSCULAR | Status: DC | PRN
  Administered 2020-02-28: .1 mg via INTRAVENOUS

## 2020-02-28 MED ORDER — DEXAMETHASONE SODIUM PHOSPHATE 10 MG/ML IJ SOLN
INTRAMUSCULAR | Status: DC | PRN
  Administered 2020-02-28: 10 mg via INTRAVENOUS

## 2020-02-28 MED ORDER — LIDOCAINE-EPINEPHRINE 1 %-1:100000 IJ SOLN
INTRAMUSCULAR | Status: DC | PRN
  Administered 2020-02-28: 14 mL

## 2020-02-28 MED ORDER — CEFDINIR 300 MG PO CAPS: 300.0000 mg | ORAL_CAPSULE | Freq: Two times a day (BID) | ORAL | 0 refills | Status: DC

## 2020-02-28 MED ORDER — LIDOCAINE HCL (CARDIAC) PF 100 MG/5ML IV SOSY
PREFILLED_SYRINGE | INTRAVENOUS | Status: DC | PRN
  Administered 2020-02-28: 40 mg via INTRAVENOUS

## 2020-02-28 MED ORDER — OXYCODONE HCL 5 MG/5ML PO SOLN
5.0000 mg | Freq: Once | ORAL | Status: AC | PRN
  Administered 2020-02-28: 5 mg via ORAL

## 2020-02-28 MED ORDER — ACETAMINOPHEN 10 MG/ML IV SOLN
1000.0000 mg | Freq: Once | INTRAVENOUS | Status: AC
  Administered 2020-02-28: 1000 mg via INTRAVENOUS

## 2020-02-28 MED ORDER — LIDOCAINE HCL 4 % MT SOLN
OROMUCOSAL | Status: DC | PRN
  Administered 2020-02-28: 2 mL via TOPICAL

## 2020-02-28 MED ORDER — FENTANYL CITRATE (PF) 100 MCG/2ML IJ SOLN
25.0000 ug | INTRAMUSCULAR | Status: DC | PRN
  Administered 2020-02-28 (×2): 25 ug via INTRAVENOUS

## 2020-02-28 MED ORDER — FENTANYL CITRATE (PF) 100 MCG/2ML IJ SOLN
INTRAMUSCULAR | Status: DC | PRN
  Administered 2020-02-28 (×6): 25 ug via INTRAVENOUS
  Administered 2020-02-28: 50 ug via INTRAVENOUS

## 2020-02-28 MED ORDER — HYDROCODONE-ACETAMINOPHEN 5-325 MG PO TABS: 1.0000 | ORAL_TABLET | Freq: Four times a day (QID) | ORAL | 0 refills | Status: DC | PRN

## 2020-02-28 SURGICAL SUPPLY — 23 items
BATTERY INSTRU NAVIGATION (MISCELLANEOUS) ×9 IMPLANT
CANISTER SUCT 1200ML W/VALVE (MISCELLANEOUS) ×3 IMPLANT
CATH IV 18X1 1/4 SAFELET (CATHETERS) ×1 IMPLANT
COAG SUCT 10F 3.5MM HAND CTRL (MISCELLANEOUS) ×3 IMPLANT
ELECT REM PT RETURN 9FT ADLT (ELECTROSURGICAL) ×3
ELECTRODE REM PT RTRN 9FT ADLT (ELECTROSURGICAL) ×2 IMPLANT
GLOVE BIO SURGEON STRL SZ7.5 (GLOVE) ×6 IMPLANT
GOWN STRL REUS W/ TWL LRG LVL3 (GOWN DISPOSABLE) ×2 IMPLANT
GOWN STRL REUS W/TWL LRG LVL3 (GOWN DISPOSABLE) ×1
IV CATH 18X1 1/4 SAFELET (CATHETERS) ×2
IV NS 500ML (IV SOLUTION) ×1
IV NS 500ML BAXH (IV SOLUTION) ×2 IMPLANT
KIT TURNOVER KIT A (KITS) ×3 IMPLANT
NS IRRIG 500ML POUR BTL (IV SOLUTION) ×3 IMPLANT
PACK ENT CUSTOM (PACKS) ×3 IMPLANT
PACKING NASAL EPIS 4X2.4 XEROG (MISCELLANEOUS) ×2 IMPLANT
PATTIES SURGICAL .5 X3 (DISPOSABLE) ×3 IMPLANT
SHAVER DIEGO BLD STD TYPE A (BLADE) ×3 IMPLANT
SOL ANTI-FOG 6CC FOG-OUT (MISCELLANEOUS) ×2 IMPLANT
SOL FOG-OUT ANTI-FOG 6CC (MISCELLANEOUS) ×1
SYR 10ML LL (SYRINGE) ×3 IMPLANT
TRACKER CRANIALMASK (MASK) ×3 IMPLANT
TUBING DECLOG MULTIDEBRIDER (TUBING) ×3 IMPLANT

## 2020-02-28 NOTE — Anesthesia Preprocedure Evaluation (Signed)
Anesthesia Evaluation  Patient identified by MRN, date of birth, ID band Patient awake    Reviewed: Allergy & Precautions, NPO status , Patient's Chart, lab work & pertinent test results  History of Anesthesia Complications (+) PROLONGED EMERGENCE  Airway Mallampati: II  TM Distance: >3 FB Neck ROM: Full    Dental   Pulmonary asthma , former smoker,    Pulmonary exam normal        Cardiovascular Normal cardiovascular exam     Neuro/Psych PSYCHIATRIC DISORDERS Depression    GI/Hepatic   Endo/Other  Hypothyroidism   Renal/GU      Musculoskeletal  (+) Arthritis ,   Abdominal   Peds  Hematology   Anesthesia Other Findings   Reproductive/Obstetrics                             Anesthesia Physical Anesthesia Plan  ASA: II  Anesthesia Plan: General   Post-op Pain Management:    Induction: Intravenous  PONV Risk Score and Plan: 2 and Ondansetron, Dexamethasone and Promethazine  Airway Management Planned: Oral ETT  Additional Equipment:   Intra-op Plan:   Post-operative Plan: Extubation in OR  Informed Consent: I have reviewed the patients History and Physical, chart, labs and discussed the procedure including the risks, benefits and alternatives for the proposed anesthesia with the patient or authorized representative who has indicated his/her understanding and acceptance.     Dental advisory given  Plan Discussed with: CRNA, Anesthesiologist and Surgeon  Anesthesia Plan Comments:         Anesthesia Quick Evaluation

## 2020-02-28 NOTE — Anesthesia Procedure Notes (Signed)
Procedure Name: Intubation Date/Time: 02/28/2020 9:52 AM Performed by: Mayme Genta, CRNA Pre-anesthesia Checklist: Patient identified, Emergency Drugs available, Suction available, Patient being monitored and Timeout performed Patient Re-evaluated:Patient Re-evaluated prior to induction Oxygen Delivery Method: Circle system utilized Preoxygenation: Pre-oxygenation with 100% oxygen Induction Type: IV induction Ventilation: Mask ventilation without difficulty Laryngoscope Size: Miller and 2 Grade View: Grade I Tube type: Oral Rae Tube size: 7.0 mm Number of attempts: 1 Placement Confirmation: ETT inserted through vocal cords under direct vision,  positive ETCO2 and breath sounds checked- equal and bilateral Tube secured with: Tape Dental Injury: Teeth and Oropharynx as per pre-operative assessment

## 2020-02-28 NOTE — Transfer of Care (Signed)
Immediate Anesthesia Transfer of Care Note  Patient: Miranda Stafford  Procedure(s) Performed: IMAGE GUIDED SINUS SURGERY (N/A Nose) FRONTAL SINUS EXPLORATION (Bilateral Nose) TOTAL ETHMOIDECTOMY (Bilateral Nose) SPHENOIDECTOMY (Bilateral Nose) MAXILLARY ANTROSTOMY WITH TISSUE REMOVAL (Bilateral Nose)  Patient Location: PACU  Anesthesia Type: General  Level of Consciousness: awake, alert  and patient cooperative  Airway and Oxygen Therapy: Patient Spontanous Breathing and Patient connected to supplemental oxygen  Post-op Assessment: Post-op Vital signs reviewed, Patient's Cardiovascular Status Stable, Respiratory Function Stable, Patent Airway and No signs of Nausea or vomiting  Post-op Vital Signs: Reviewed and stable  Complications: No apparent anesthesia complications

## 2020-02-28 NOTE — Op Note (Signed)
02/28/2020  12:09 PM    [name]  BW:[id number]   Pre-Op Diagnosis:  Chronic pansinusitis, nasal polyposis  Post-op Diagnosis: Chronic pansinusitis, nasal polyposis  Procedure:  1)  Image Guided Sinus Surgery,   2)  Bilateral Endoscopic Maxillary Antrostomy with Tissue Removal   3)  Bilateral Frontal Sinusotomy   4)  Bilateral Total Ethmoidectomy   5)  Bilateral Sphenoidotomy    Surgeon:  Riley Nearing  Anesthesia:  General endotracheal  EBL:  [id number]  Complications:  None  Findings: Diffuse bilateral nasal polyps involving the middle meatus, maxillary antrum, ethmoid sinuses, sphenoid recess, and frontal recess. Dense thick secretions  Procedure: After the patient was identified in holding and the benefits of the procedure were reviewed as well as the consent and risks, the patient was taken to the operating room and with the patient in a comfortable supine position,  general orotracheal anesthesia was induced without difficulty.  A proper time-out was performed.  The [employer]ryker image guidance system was set up and calibrated in the normal fashion and felt to be acceptable.  Next 1% Xylocaine with 1:100,000 epinephrine was infiltrated into the inferior turbinates, and anterior middle turbinates bilaterally.  Several minutes were allowed for this to take effect.  Cottoniod pledgets soaked in Afrin were placed into both nasal cavities and left while the patient was prepped and draped in the standard fashion. The image guided suction was calibrated and used to inspect known points in the nasal cavity to assess accuracy of the image guided system. Accuracy was felt to be excellent.   The left middle turbinate was medialized and polyps debrided from the middle meatus with forceps. The middle turbinate appeared widened, and was incised anteriorly, entering a small concha bullosa. The lateral wall of the concha bullosa was resected with endoscopic scissors to better open the middle meatus.  Next the uncinate process then resected with through-cutting forceps as well as the microdebrider. In this fashion the uncinate was completely removed along with soft tissue and bone of the medial wall of the maxillary sinus to create a large patent maxillary antrostomy. The left maxillary sinus was suctioned to clear dense secretions. Small polyps within the sinus were debrided.  Next the left anterior ethmoid sinuses were dissected beginning inferomedially, entering the ethmoid bulla. Thru cut forceps were used to open the anterior ethmoids. The microdebrider was used as needed to trim loose mucosal edges.  Next the basal lamella was entered and, working back in a sequential fashion through the ethmoid air cells, the ethmoid sinuses were dissected to the posterior ethmoid sinuses, utilizing the image guided suction and the whole time to reassess the anatomy frequently. Forceps were used for this dissection, removing polyps and when needed, resecting  the bony partitions between the ethmoid air cells to open the sinuses more completely. Care was taken to avoid injury to the lamina papyracea laterally and the skull base superiorly.   Dissection proceeded anteriorly and superiorly into the left frontal recess which was dissected utilizing a 30 and then a 70 scope and frontal curved instruments. The curved image guided suction was used during this dissection to frequently reassess the anatomy on the CT scan. The frontal recess was dissected until a suction could be passed up into the region of the frontal sinus.   Next the scope was passed medial to the middle turbinate and the left sphenoid recess inspected. With the assistance of the image guided system, the sphenoid sinus was carefully entered through some thin  bone at the anterior face of the spenoid, medial to the superior turbinate, and widely opened with through-cutting sphenoid punch forceps. Mucus was suctioned from the sphenoid sinus. The debrider  was used to remove polyps in the sphenoid recess. Bleeding was noted from this region from a branch of the spheno-ethmoidal artery and was controlled with the suction cautery.  Attention was then turned to the right side where the same procedure was performed, opening the maxillary sinus, ethmoid cavities, sphenoid sinus and the frontal recess in the same fashion as described above. There was not a concha bullosa on the right side however.   The nose was suctioned and inspected. The maxillary sinuses were irrigated with saline. Xerogel absorbable sinus packing was then placed in the ethmoid cavities bilaterally.   The patient was then returned to the anesthesiologist for awakening and taken to recovery room in good condition postoperatively.  Disposition:   PACU and d/c home  Plan: Ice, elevation, narcotic analgesia and prophylactic antibiotics. Begin sinus irrigations with saline tomrrow, irrigating 3-4 times daily. Return to the office in 7 days.  Return to work in 7-10 days, no strenuous activities for two weeks.   Riley Nearing 02/28/2020 12:09 PM

## 2020-02-28 NOTE — Anesthesia Postprocedure Evaluation (Signed)
Anesthesia Post Note  Patient: Miranda Stafford  Procedure(s) Performed: IMAGE GUIDED SINUS SURGERY (N/A Nose) FRONTAL SINUS EXPLORATION (Bilateral Nose) TOTAL ETHMOIDECTOMY (Bilateral Nose) SPHENOIDECTOMY (Bilateral Nose) MAXILLARY ANTROSTOMY WITH TISSUE REMOVAL (Bilateral Nose)     Patient location during evaluation: PACU Anesthesia Type: General Level of consciousness: awake and alert Pain management: pain level controlled Vital Signs Assessment: post-procedure vital signs reviewed and stable Respiratory status: spontaneous breathing, nonlabored ventilation, respiratory function stable and patient connected to nasal cannula oxygen Cardiovascular status: blood pressure returned to baseline and stable Postop Assessment: no apparent nausea or vomiting Anesthetic complications: no    Miranda Stafford

## 2020-02-28 NOTE — H&P (Signed)
Miranda Stafford, Mckee [id number] 1956-04-09  Date of Admission: 02/28/2020  Admitting Physician: Riley Nearing  Chief Complaint: Nasal Polyps  HPI: This 64 y.o. year old female with chronic sinusitis and nasal polyps unresponsive to medical management   Medications:  Facility-Administered Medications Prior to Admission  Medication Dose Route Frequency Provider Last Rate Last Admin  . cyanocobalamin ((VITAMIN B-12)) injection 1,000 mcg  1,000 mcg Subcutaneous Q30 days Steele Sizer, MD   1,000 mcg at 11/28/19 1021   Medications Prior to Admission  Medication Sig Dispense Refill  . albuterol (PROVENTIL HFA;VENTOLIN HFA) 108 (90 Base) MCG/ACT inhaler INHALE 2 PUFFS INTO THE LUNGS EVERY 6 HOURS AS NEEDED FOR WHEEZING OR SHORTNESS OF BREATH 6.7 g 0  . azelastine (ASTELIN) 0.1 % nasal spray Place 2 sprays into both nostrils 2 (two) times daily. Use in each nostril as directed 30 mL 2  . cyclobenzaprine (FLEXERIL) 5 MG tablet TAKE 1 TABLET(5 MG) BY MOUTH THREE TIMES DAILY AS NEEDED FOR MUSCLE SPASMS 20 tablet 0  . levocetirizine (XYZAL) 5 MG tablet Take 5 mg by mouth every evening.    Marland Kitchen PREDNISONE PO Take by mouth.    . SYNTHROID 88 MCG tablet TAKE 1 TABLET BY MOUTH DAILY BEFORE BREAKFAST ON MONDAY THROUGH FRIDAY 90 tablet 0  . fluticasone (FLONASE) 50 MCG/ACT nasal spray Place 2 sprays into both nostrils daily.    . fluticasone furoate-vilanterol (BREO ELLIPTA) 100-25 MCG/INH AEPB Inhale 1 puff into the lungs daily. (Patient not taking: Reported on 12/19/2019) 60 each 1  . meloxicam (MOBIC) 15 MG tablet Take 15 mg by mouth daily.    . montelukast (SINGULAIR) 10 MG tablet TAKE 1 TABLET(10 MG) BY MOUTH AT BEDTIME (Patient not taking: TAKE 1 TABLET(10 MG) BY MOUTH AT BEDTIME) 30 tablet 5  . Vitamin D, Ergocalciferol, (DRISDOL) 1.25 MG (50000 UT) CAPS capsule Take 1 capsule (50,000 Units total) by mouth every 7 (seven) days. (Patient not taking: Reported on 02/20/2020) 12 capsule 1    Allergies:   Allergies  Allergen Reactions  . Aspirin Nausea And Vomiting  . Nsaids     Nausea and vomiting     PMH:  Past Medical History:  Diagnosis Date  . Allergy   . Anemia   . Arthritis   . Asthma   . Complication of anesthesia    slow to wake  . Fibroid tumor   . Herpes zoster   . Hyperlipidemia   . Hypothyroidism   . Insomnia   . Osteopenia   . Thyroid disease   . Wears dentures    partial upper and lower    Fam Hx:  Family History  Problem Relation Age of Onset  . Heart disease Mother   . Hypertension Mother   . Hyperlipidemia Mother   . SIDS Son   . Breast cancer Sister 40  . Hypothyroidism Sister   . Hypertension Sister     Soc Hx:  Social History   Socioeconomic History  . Marital status: Married    Spouse name: Abe People  . Number of children: 2  . Years of education: Not on file  . Highest education level: Professional school degree (e.g., MD, DDS, DVM, JD)  Occupational History  . Occupation: Retired Pharmacist, hospital  Tobacco Use  . Smoking status: Former Smoker    Packs/day: 0.50    Years: 5.00    Pack years: 2.50    Types: Cigarettes    Start date: 11/16/1979    Quit date: 09/18/1985  Years since quitting: 34.4  . Smokeless tobacco: Never Used  Substance and Sexual Activity  . Alcohol use: Yes    Alcohol/week: 0.0 standard drinks    Comment: wine/socially/rarely  . Drug use: No  . Sexual activity: Yes    Partners: Male  Other Topics Concern  . Not on file  Social History Narrative  . Not on file   Social Determinants of Health   Financial Resource Strain:   . Difficulty of Paying Living Expenses:   Food Insecurity:   . Worried About Charity fundraiser in the Last Year:   . Arboriculturist in the Last Year:   Transportation Needs:   . Film/video editor (Medical):   Marland Kitchen Lack of Transportation (Non-Medical):   Physical Activity: Insufficiently Active  . Days of Exercise per Week: 5 days  . Minutes of Exercise per Session: 20 min  Stress:    . Feeling of Stress :   Social Connections:   . Frequency of Communication with Friends and Family:   . Frequency of Social Gatherings with Friends and Family:   . Attends Religious Services:   . Active Member of Clubs or Organizations:   . Attends Archivist Meetings:   Marland Kitchen Marital Status:   Intimate Partner Violence:   . Fear of Current or Ex-Partner:   . Emotionally Abused:   Marland Kitchen Physically Abused:   . Sexually Abused:     PSH:  Past Surgical History:  Procedure Laterality Date  . ABDOMINAL HYSTERECTOMY  2005   fibroid tumor  . ADENOIDECTOMY    . CESAREAN SECTION    . TUBAL LIGATION    .   ROS: Negative for fever, cough.  PHYSICAL EXAM  Vitals: Blood pressure (!) 127/54, pulse (!) 56, temperature (!) 97.5 F (36.4 C), temperature source Temporal, resp. rate 16, height 5\' 3"  (1.6 m), weight 53.5 kg, SpO2 100 %.. General: Well-developed, Well-nourished in no acute distress Mood: Mood and affect well adjusted, pleasant and cooperative. Orientation: Grossly alert and oriented. Vocal Quality: No hoarseness. Communicates verbally. head and Face: NCAT. No facial asymmetry. No visible skin lesions. No significant facial scars. No tenderness with sinus percussion. Facial strength normal and symmetric. Ears: External ears with normal landmarks, no lesions.  Hearing: Speech reception grossly normal. Nose: External nose normal with midline dorsum and no lesions or deformity. Nasal Cavity noted in office to have congestion and polyps bilaterally Respiratory: Normal respiratory effort without labored breathing. Lungs CTA bilaterally Cardiovascular: Heart has regular rate and rhythm  MEDICAL DECISION MAKING: Data Review: No results found for this or any previous visit (from the past 48 hour(s)).Marland Kitchen No results found..   ASSESSMENT: Chronic pansinusitis, nasal polyposis  PLAN: Bilateral FESS, image guided with bilateral maxillary antrostomies with tissue removal, total  ethmoidectomies, bilateral sphenoidectomies   Riley Nearing 02/28/2020 9:31 AM

## 2020-02-29 ENCOUNTER — Encounter: Payer: Self-pay | Admitting: *Deleted

## 2020-03-03 LAB — SURGICAL PATHOLOGY

## 2020-07-01 ENCOUNTER — Other Ambulatory Visit: Payer: Self-pay | Admitting: Family Medicine

## 2020-08-16 ENCOUNTER — Encounter: Payer: Self-pay | Admitting: Family Medicine

## 2020-09-09 ENCOUNTER — Other Ambulatory Visit: Payer: Self-pay

## 2020-09-09 ENCOUNTER — Encounter: Payer: Self-pay | Admitting: Family Medicine

## 2020-09-09 ENCOUNTER — Ambulatory Visit (INDEPENDENT_AMBULATORY_CARE_PROVIDER_SITE_OTHER): Payer: BC Managed Care – PPO | Admitting: Family Medicine

## 2020-09-09 VITALS — BP 110/70 | HR 86 | Temp 97.6°F | Resp 16 | Ht 63.0 in | Wt 110.3 lb

## 2020-09-09 DIAGNOSIS — E039 Hypothyroidism, unspecified: Secondary | ICD-10-CM | POA: Diagnosis not present

## 2020-09-09 DIAGNOSIS — R700 Elevated erythrocyte sedimentation rate: Secondary | ICD-10-CM

## 2020-09-09 DIAGNOSIS — E559 Vitamin D deficiency, unspecified: Secondary | ICD-10-CM

## 2020-09-09 DIAGNOSIS — R768 Other specified abnormal immunological findings in serum: Secondary | ICD-10-CM | POA: Diagnosis not present

## 2020-09-09 DIAGNOSIS — D708 Other neutropenia: Secondary | ICD-10-CM | POA: Diagnosis not present

## 2020-09-09 DIAGNOSIS — M359 Systemic involvement of connective tissue, unspecified: Secondary | ICD-10-CM

## 2020-09-09 DIAGNOSIS — R7689 Other specified abnormal immunological findings in serum: Secondary | ICD-10-CM

## 2020-09-09 DIAGNOSIS — D649 Anemia, unspecified: Secondary | ICD-10-CM

## 2020-09-09 DIAGNOSIS — E538 Deficiency of other specified B group vitamins: Secondary | ICD-10-CM | POA: Diagnosis not present

## 2020-09-09 DIAGNOSIS — J3089 Other allergic rhinitis: Secondary | ICD-10-CM

## 2020-09-09 DIAGNOSIS — E785 Hyperlipidemia, unspecified: Secondary | ICD-10-CM

## 2020-09-09 DIAGNOSIS — I7300 Raynaud's syndrome without gangrene: Secondary | ICD-10-CM

## 2020-09-09 DIAGNOSIS — J4520 Mild intermittent asthma, uncomplicated: Secondary | ICD-10-CM

## 2020-09-09 DIAGNOSIS — R7303 Prediabetes: Secondary | ICD-10-CM

## 2020-09-09 MED ORDER — MONTELUKAST SODIUM 10 MG PO TABS: 10.0000 mg | ORAL_TABLET | Freq: Every day | ORAL | 1 refills | Status: DC

## 2020-09-09 MED ORDER — ALBUTEROL SULFATE HFA 108 (90 BASE) MCG/ACT IN AERS: 2.0000 | INHALATION_SPRAY | RESPIRATORY_TRACT | 0 refills | Status: DC | PRN

## 2020-09-09 MED ORDER — VITAMIN D (ERGOCALCIFEROL) 1.25 MG (50000 UNIT) PO CAPS: 50000.0000 [IU] | ORAL_CAPSULE | ORAL | 1 refills | Status: DC

## 2020-09-09 MED ORDER — LEVOTHYROXINE SODIUM 88 MCG PO TABS: 88.0000 ug | ORAL_TABLET | Freq: Every day | ORAL | 0 refills | Status: DC

## 2020-09-09 NOTE — Progress Notes (Signed)
Name: Miranda Stafford   MRN: [id number]    DOB: [birth year]   Date:09/09/2020       Progress Note  Subjective  Chief Complaint  Bloodwork/Thyroid  HPI    Joint Stiffness: she was seen  Fall 2020  because of new onset of joint stiffness, initially was on her hands and legs but was not lasting all day, she had positive ANA and sent to Rheumatologist in [place] further test was done and she was given reassurance. She was  getting progressively worse, to the point that  sometimes her pain was so intense that she cannot get out of bed. She was noticing that her hands were  swollen and had difficulty removing  her rings. She states stiffness and pain was  so intense that she needs to use rails to go down her stairs, pain removing her clothes and Raynaud's symptoms , she states she got the [place] -19 vaccine, she felt very tired and in bed for 3 days, but on the forth day she noticed she could go up and down the stairs without pain, since than she has been feeling well, has lost weight. She still has intermittent episodes of Raynaud's but very mild. Diagnosed with Connective tissue disease but unknown type   B12 deficiency: we will recheck labs , no longer on supplementation   Hypothyroidism: taking medication M-F, she would like to pay for brand only. We will recheck TSH, she lost some weight since last visit, but no palpitation or change in bowel movements    Change in bowel movements: resolved   Leucopenia and anemia: seen by Dr. Janese Banks in 2018, had bone marrow biopsy and was given reassurance, she also has some anemia and we will recheck labs   Nasal polyps/seasonal allergies: had sinus surgery, taking singulair and is feeling better, she stopped dairy and sugar to avoid having to start allergy shots  Asthma mild intermittently: she is doing well, able to breath better, denies wheezing, cough or sob   Patient Active Problem List   Diagnosis Date Noted  . Connective tissue disease ([place])  12/19/2019  . Other neutropenia ([place]) 01/10/2018  . Allergic rhinitis due to allergen 08/03/2017  . B12 deficiency 02/25/2017  . Family history of breast cancer in sister 05/24/2016  . Hypothyroidism 09/19/2015  . Asthma, moderate persistent, poorly-controlled 09/19/2015  . Perennial allergic rhinitis with seasonal variation 09/19/2015  . Major depression in remission ([place]) 09/19/2015    Past Surgical History:  Procedure Laterality Date  . ABDOMINAL HYSTERECTOMY  2005   fibroid tumor  . ADENOIDECTOMY    . CESAREAN SECTION    . ETHMOIDECTOMY Bilateral 02/28/2020   Procedure: TOTAL ETHMOIDECTOMY;  Surgeon: Clyde Canterbury, MD;  Location: [facility];  Service: ENT;  Laterality: Bilateral;  . FRONTAL SINUS EXPLORATION Bilateral 02/28/2020   Procedure: FRONTAL SINUS EXPLORATION;  Surgeon: Clyde Canterbury, MD;  Location: [facility];  Service: ENT;  Laterality: Bilateral;  . IMAGE GUIDED SINUS SURGERY N/A 02/28/2020   Procedure: IMAGE GUIDED SINUS SURGERY;  Surgeon: Clyde Canterbury, MD;  Location: [facility];  Service: ENT;  Laterality: N/A;  . MAXILLARY ANTROSTOMY Bilateral 02/28/2020   Procedure: MAXILLARY ANTROSTOMY WITH TISSUE REMOVAL;  Surgeon: Clyde Canterbury, MD;  Location: [facility];  Service: ENT;  Laterality: Bilateral;  . SPHENOIDECTOMY Bilateral 02/28/2020   Procedure: SPHENOIDECTOMY;  Surgeon: Clyde Canterbury, MD;  Location: [facility];  Service: ENT;  Laterality: Bilateral;  . TUBAL LIGATION      Family History  Problem Relation Age of Onset  . Heart disease Mother   . Hypertension Mother   . Hyperlipidemia Mother   . SIDS Son   . Breast cancer Sister 64  . Hypothyroidism Sister   . Hypertension Sister     Social History   Tobacco Use  . Smoking status: Former Smoker    Packs/day: 0.50    Years: 5.00    Pack years: 2.50    Types: Cigarettes    Start date: 11/16/1979    Quit date: 09/18/1985    Years since quitting: 35.0  .  Smokeless tobacco: Never Used  Substance Use Topics  . Alcohol use: Yes    Alcohol/week: 0.0 standard drinks    Comment: wine/socially/rarely     Current Outpatient Medications:  .  albuterol (VENTOLIN HFA) 108 (90 Base) MCG/ACT inhaler, Inhale 2 puffs into the lungs every 4 (four) hours as needed for wheezing or shortness of breath., Disp: 18 g, Rfl: 0 .  fluticasone furoate-vilanterol (BREO ELLIPTA) 100-25 MCG/INH AEPB, Inhale 1 puff into the lungs daily., Disp: 60 each, Rfl: 1 .  levothyroxine (SYNTHROID) 88 MCG tablet, Take 1 tablet (88 mcg total) by mouth daily before breakfast., Disp: 30 tablet, Rfl: 0 .  montelukast (SINGULAIR) 10 MG tablet, Take 1 tablet (10 mg total) by mouth at bedtime., Disp: 90 tablet, Rfl: 1 .  Vitamin D, Ergocalciferol, (DRISDOL) 1.25 MG (50000 UNIT) CAPS capsule, Take 1 capsule (50,000 Units total) by mouth every 7 (seven) days., Disp: 12 capsule, Rfl: 1  Allergies  Allergen Reactions  . Aspirin Nausea And Vomiting  . Nsaids     Nausea and vomiting     I personally reviewed active problem list, medication list, allergies, family history, social history, health maintenance with the patient/caregiver today.   ROS  Constitutional: Negative for fever , positive for weight change.  Respiratory: Negative for cough and shortness of breath.   Cardiovascular: Negative for chest pain or palpitations.  Gastrointestinal: Negative for abdominal pain, no bowel changes.  Musculoskeletal: Negative for gait problem or joint swelling.  Skin: Negative for rash.  Neurological: Negative for dizziness or headache.  No other specific complaints in a complete review of systems (except as listed in HPI above).  Objective  Vitals:   09/09/20 1405  BP: 110/70  Pulse: 86  Resp: 16  Temp: 97.6 F (36.4 C)  TempSrc: Oral  SpO2: 99%  Weight: 110 lb 4.8 oz (50 kg)  Height: $Remove'5\' 3"'[id number]$  (1.6 m)    Body mass index is 19.54 kg/m.  Physical Exam  Constitutional: Patient  appears well-developed and well-nourished.  No distress.  HEENT: head atraumatic, normocephalic, pupils equal and reactive to light, neck supple Cardiovascular: Normal rate, regular rhythm and normal heart sounds.  No murmur heard. No BLE edema. Pulmonary/Chest: Effort normal and breath sounds normal. No respiratory distress. Abdominal: Soft.  There is no tenderness. Muscular skeletal: no synovitis  Psychiatric: Patient has a normal mood and affect. behavior is normal. Judgment and thought content normal.  PHQ2/9: Depression screen [facility] 2/9 09/09/2020 11/28/2019 09/06/2019 08/16/2019 07/18/2019  Decreased Interest 0 0 0 0 0  Down, Depressed, Hopeless 0 0 0 0 0  PHQ - 2 Score 0 0 0 0 0  Altered sleeping - 0 0 0 3  Tired, decreased energy - 0 0 0 3  Change in appetite - 0 0 0 0  Feeling bad or failure about yourself  - 0 0 0 0  Trouble concentrating - 0 0 0  1  Moving slowly or fidgety/restless - 0 0 0 0  Suicidal thoughts - 0 0 0 0  PHQ-9 Score - 0 0 0 7  Difficult doing work/chores - - - Not difficult at all Very difficult  Some recent data might be hidden    phq 9 is negative   Fall Risk: Fall Risk  09/09/2020 11/28/2019 09/06/2019 08/16/2019 07/18/2019  Falls in the past year? 0 0 0 0 0  Number falls in past yr: 0 0 0 0 0  Injury with Fall? 1 0 0 0 0  Follow up - - - - -     Functional Status Survey: Is the patient deaf or have difficulty hearing?: No Does the patient have difficulty seeing, even when wearing glasses/contacts?: No Does the patient have difficulty concentrating, remembering, or making decisions?: No Does the patient have difficulty walking or climbing stairs?: No Does the patient have difficulty dressing or bathing?: No Does the patient have difficulty doing errands alone such as visiting a doctor's office or shopping?: No    Assessment & Plan  1. Positive ANA (antinuclear antibody)  - Sedimentation rate - C-reactive protein - CBC with  Differential/Platelet - COMPLETE METABOLIC PANEL WITH GFR - ANA,IFA Sjogrens' Pnl rflx Tit/Patn  2. Hypothyroidism, adult  - TSH - levothyroxine (SYNTHROID) 88 MCG tablet; Take 1 tablet (88 mcg total) by mouth daily before breakfast.  Dispense: 30 tablet; Refill: 0  3. B12 deficiency  Recheck B12  4. Other neutropenia ([facility])  - CBC with Differential/Platelet  5. Dyslipidemia  - Lipid panel  6. Vitamin D deficiency  - Vitamin D, Ergocalciferol, (DRISDOL) 1.25 MG (50000 UNIT) CAPS capsule; Take 1 capsule (50,000 Units total) by mouth every 7 (seven) days.  Dispense: 12 capsule; Refill: 1  7. Pre-diabetes  - Hemoglobin A1c  8. Mild intermittent asthma without complication  - albuterol (VENTOLIN HFA) 108 (90 Base) MCG/ACT inhaler; Inhale 2 puffs into the lungs every 4 (four) hours as needed for wheezing or shortness of breath.  Dispense: 18 g; Refill: 0 - montelukast (SINGULAIR) 10 MG tablet; Take 1 tablet (10 mg total) by mouth at bedtime.  Dispense: 90 tablet; Refill: 1  9. Non-seasonal allergic rhinitis due to other allergic trigger  - montelukast (SINGULAIR) 10 MG tablet; Take 1 tablet (10 mg total) by mouth at bedtime.  Dispense: 90 tablet; Refill: 1  10. Anemia, unspecified type  - CBC with Differential/Platelet  11. Raynaud's phenomenon without gangrene  - Sedimentation rate - C-reactive protein - CBC with Differential/Platelet - COMPLETE METABOLIC PANEL WITH GFR - ANA,IFA Sjogrens' Pnl rflx Tit/Patn  12. Elevated sed rate  - Sedimentation rate - C-reactive protein  13. Connective tissue disease ([place])  She states will schedule follow up with Rheumatologist if labs abnormal or pain returns   She refused flu, pneumonia and shingrix vaccine

## 2020-09-12 LAB — ANA,IFA SJOGRENS' PNL RFLX TIT/PATN
Anti Nuclear Antibody (ANA): POSITIVE — AB
Rheumatoid fact SerPl-aCnc: <14 IU/mL (ref <14)
SSA (Ro) (ENA) Antibody, IgG: <1 AI
SSB (La) (ENA) Antibody, IgG: <1 AI

## 2020-09-12 LAB — CBC WITH DIFFERENTIAL/PLATELET
Absolute Monocytes: 171 cells/uL — ABNORMAL LOW (ref 200–950)
Basophils Absolute: 38 cells/uL (ref 0–200)
Basophils Relative: 1 %
Eosinophils Absolute: 308 cells/uL (ref 15–500)
Eosinophils Relative: 8.1 %
HCT: 30.1 % — ABNORMAL LOW (ref 35.0–45.0)
Hemoglobin: 9.5 g/dL — ABNORMAL LOW (ref 11.7–15.5)
Lymphs Abs: 2075 cells/uL (ref 850–3900)
MCH: 25.7 pg — ABNORMAL LOW (ref 27.0–33.0)
MCHC: 31.6 g/dL — ABNORMAL LOW (ref 32.0–36.0)
MCV: 81.6 fL (ref 80.0–100.0)
MPV: 11 fL (ref 7.5–12.5)
Monocytes Relative: 4.5 %
Neutro Abs: 1208 cells/uL — ABNORMAL LOW (ref 1500–7800)
Neutrophils Relative %: 31.8 %
Platelets: 239 10*3/uL (ref 140–400)
RBC: 3.69 10*6/uL — ABNORMAL LOW (ref 3.80–5.10)
RDW: 14.2 % (ref 11.0–15.0)
Total Lymphocyte: 54.6 %
WBC: 3.8 10*3/uL (ref 3.8–10.8)

## 2020-09-12 LAB — VITAMIN B12: Vitamin B-12: 432 pg/mL (ref 200–1100)

## 2020-09-12 LAB — COMPLETE METABOLIC PANEL WITH GFR
AG Ratio: 1.6 (calc) (ref 1.0–2.5)
ALT: 17 U/L (ref 6–29)
AST: 26 U/L (ref 10–35)
Albumin: 4.4 g/dL (ref 3.6–5.1)
Alkaline phosphatase (APISO): 54 U/L (ref 37–153)
BUN: 10 mg/dL (ref 7–25)
CO2: 27 mmol/L (ref 20–32)
Calcium: 9.5 mg/dL (ref 8.6–10.4)
Chloride: 106 mmol/L (ref 98–110)
Creat: 0.86 mg/dL (ref 0.50–0.99)
GFR, Est African American: 84 mL/min/{1.73_m2} (ref >=60)
GFR, Est Non African American: 72 mL/min/{1.73_m2} (ref >=60)
Globulin: 2.8 g/dL (calc) (ref 1.9–3.7)
Glucose, Bld: 82 mg/dL (ref 65–99)
Potassium: 4.9 mmol/L (ref 3.5–5.3)
Sodium: 141 mmol/L (ref 135–146)
Total Bilirubin: 0.5 mg/dL (ref 0.2–1.2)
Total Protein: 7.2 g/dL (ref 6.1–8.1)

## 2020-09-12 LAB — SEDIMENTATION RATE: Sed Rate: 56 mm/h — ABNORMAL HIGH (ref 0–30)

## 2020-09-12 LAB — ANTI-NUCLEAR AB-TITER (ANA TITER): ANA Titer 1: 1:40 {titer} — ABNORMAL HIGH

## 2020-09-12 LAB — TSH: TSH: 2.49 mIU/L (ref 0.40–4.50)

## 2020-09-12 LAB — LIPID PANEL
Cholesterol: 252 mg/dL — ABNORMAL HIGH (ref <200)
HDL: 74 mg/dL (ref >=50)
LDL Cholesterol (Calc): 160 mg/dL (calc) — ABNORMAL HIGH
Non-HDL Cholesterol (Calc): 178 mg/dL (calc) — ABNORMAL HIGH (ref <130)
Total CHOL/HDL Ratio: 3.4 (calc) (ref <5.0)
Triglycerides: 78 mg/dL (ref <150)

## 2020-09-12 LAB — HEMOGLOBIN A1C
Hgb A1c MFr Bld: 5.9 % of total Hgb — ABNORMAL HIGH (ref <5.7)
Mean Plasma Glucose: 123 (calc)
eAG (mmol/L): 6.8 (calc)

## 2020-09-12 LAB — C-REACTIVE PROTEIN: CRP: 9.9 mg/L — ABNORMAL HIGH (ref <8.0)

## 2020-09-22 ENCOUNTER — Encounter: Payer: Self-pay | Admitting: Family Medicine

## 2020-09-30 ENCOUNTER — Other Ambulatory Visit: Payer: Self-pay | Admitting: Family Medicine

## 2020-09-30 DIAGNOSIS — E039 Hypothyroidism, unspecified: Secondary | ICD-10-CM

## 2020-11-17 ENCOUNTER — Other Ambulatory Visit: Payer: Self-pay | Admitting: Family Medicine

## 2020-11-17 DIAGNOSIS — R52 Pain, unspecified: Secondary | ICD-10-CM

## 2020-11-17 DIAGNOSIS — S161XXA Strain of muscle, fascia and tendon at neck level, initial encounter: Secondary | ICD-10-CM

## 2020-11-17 DIAGNOSIS — M2560 Stiffness of unspecified joint, not elsewhere classified: Secondary | ICD-10-CM

## 2020-11-18 ENCOUNTER — Encounter: Payer: Self-pay | Admitting: Family Medicine

## 2020-11-21 ENCOUNTER — Encounter: Payer: Self-pay | Admitting: Family Medicine

## 2020-11-21 ENCOUNTER — Other Ambulatory Visit: Payer: Self-pay

## 2020-11-21 ENCOUNTER — Ambulatory Visit (INDEPENDENT_AMBULATORY_CARE_PROVIDER_SITE_OTHER): Payer: BC Managed Care – PPO | Admitting: Family Medicine

## 2020-11-21 VITALS — BP 110/70 | HR 97 | Temp 97.7°F | Resp 14 | Ht 63.0 in | Wt 113.4 lb

## 2020-11-21 DIAGNOSIS — M359 Systemic involvement of connective tissue, unspecified: Secondary | ICD-10-CM | POA: Diagnosis not present

## 2020-11-21 DIAGNOSIS — I7300 Raynaud's syndrome without gangrene: Secondary | ICD-10-CM

## 2020-11-21 DIAGNOSIS — D649 Anemia, unspecified: Secondary | ICD-10-CM

## 2020-11-21 DIAGNOSIS — R768 Other specified abnormal immunological findings in serum: Secondary | ICD-10-CM | POA: Diagnosis not present

## 2020-11-21 DIAGNOSIS — F330 Major depressive disorder, recurrent, mild: Secondary | ICD-10-CM

## 2020-11-21 NOTE — Progress Notes (Addendum)
Name: Miranda Stafford   MRN: [id number]    DOB: 1956/07/27   Date:11/21/2020       Progress Note  Subjective  Chief Complaint  Chief Complaint  Patient presents with  . Spasms    Unexplained muscle weakness, would like lab work    HPI  Connective tissue diease flare: she was doing well however over the past two weeks she noticed increase in pain, stiffness, difficulty going upstairs. She feels like her bones are on fire. She has been taking Tylenol arthritis also drinking tumeric and ginger tea. She contacted Dr. Dossie Der but appointment not until end of this month. She would like to have labs done today. She has also noticed increase of Raynaud's pain on 4th and 5th finger since the temperature is lower.   MDD: she is feeling down , worried about her mother that has signs of dementia, she is trying to meditate more, does not want medications at this time, she states she has friends that she can talk to and does not want to see a therapist. Explained stress can trigger auto-immune disorders to get worse  Patient Active Problem List   Diagnosis Date Noted  . Connective tissue disease ([place]) 12/19/2019  . Other neutropenia ([place]) 01/10/2018  . Allergic rhinitis due to allergen 08/03/2017  . B12 deficiency 02/25/2017  . Family history of breast cancer in sister 05/24/2016  . Hypothyroidism 09/19/2015  . Asthma, moderate persistent, poorly-controlled 09/19/2015  . Perennial allergic rhinitis with seasonal variation 09/19/2015  . Major depression in remission ([place]) 09/19/2015    Past Surgical History:  Procedure Laterality Date  . ABDOMINAL HYSTERECTOMY  2005   fibroid tumor  . ADENOIDECTOMY    . CESAREAN SECTION    . ETHMOIDECTOMY Bilateral 02/28/2020   Procedure: TOTAL ETHMOIDECTOMY;  Surgeon: Clyde Canterbury, MD;  Location: [place];  Service: ENT;  Laterality: Bilateral;  . FRONTAL SINUS EXPLORATION Bilateral 02/28/2020   Procedure: FRONTAL SINUS EXPLORATION;  Surgeon: Clyde Canterbury, MD;  Location: [place];  Service: ENT;  Laterality: Bilateral;  . IMAGE GUIDED SINUS SURGERY N/A 02/28/2020   Procedure: IMAGE GUIDED SINUS SURGERY;  Surgeon: Clyde Canterbury, MD;  Location: [place];  Service: ENT;  Laterality: N/A;  . MAXILLARY ANTROSTOMY Bilateral 02/28/2020   Procedure: MAXILLARY ANTROSTOMY WITH TISSUE REMOVAL;  Surgeon: Clyde Canterbury, MD;  Location: [place];  Service: ENT;  Laterality: Bilateral;  . SPHENOIDECTOMY Bilateral 02/28/2020   Procedure: SPHENOIDECTOMY;  Surgeon: Clyde Canterbury, MD;  Location: [place];  Service: ENT;  Laterality: Bilateral;  . TUBAL LIGATION      Family History  Problem Relation Age of Onset  . Heart disease Mother   . Hypertension Mother   . Hyperlipidemia Mother   . SIDS Son   . Breast cancer Sister 70  . Hypothyroidism Sister   . Hypertension Sister     Social History   Tobacco Use  . Smoking status: Former Smoker    Packs/day: 0.50    Years: 5.00    Pack years: 2.50    Types: Cigarettes    Start date: 11/16/1979    Quit date: 09/18/1985    Years since quitting: 35.2  . Smokeless tobacco: Never Used  Substance Use Topics  . Alcohol use: Yes    Alcohol/week: 0.0 standard drinks    Comment: wine/socially/rarely     Current Outpatient Medications:  .  albuterol (VENTOLIN HFA) 108 (90 Base) MCG/ACT inhaler, Inhale 2 puffs into the lungs every 4 (  four) hours as needed for wheezing or shortness of breath., Disp: 18 g, Rfl: 0 .  fluticasone furoate-vilanterol (BREO ELLIPTA) 100-25 MCG/INH AEPB, Inhale 1 puff into the lungs daily., Disp: 60 each, Rfl: 1 .  montelukast (SINGULAIR) 10 MG tablet, Take 1 tablet (10 mg total) by mouth at bedtime., Disp: 90 tablet, Rfl: 1 .  SYNTHROID 88 MCG tablet, TAKE 1 TABLET BY MOUTH DAILY BEFORE BREAKFAST ON MONDAY THROUGH FRIDAY, Disp: 30 tablet, Rfl: 0 .  Vitamin D, Ergocalciferol, (DRISDOL) 1.25 MG (50000 UNIT) CAPS capsule, Take 1 capsule (50,000  Units total) by mouth every 7 (seven) days., Disp: 12 capsule, Rfl: 1  Allergies  Allergen Reactions  . Aspirin Nausea And Vomiting  . Nsaids     Nausea and vomiting     I personally reviewed active problem list, medication list, allergies, family history, social history with the patient/caregiver today.   ROS  Ten systems reviewed and is negative except as mentioned in HPI   Objective  Vitals:   11/21/20 1038  BP: 110/70  Pulse: 97  Resp: 14  Temp: 97.7 F (36.5 C)  TempSrc: Oral  SpO2: 99%  Weight: 113 lb 6.4 oz (51.4 kg)  Height: 5\' 3"  (1.6 m)    Body mass index is 20.09 kg/m.  Physical Exam  Constitutional: Patient appears well-developed and well-nourished.No distress.  HEENT: head atraumatic, normocephalic, pupils equal and reactive to light,  neck supple Cardiovascular: Normal rate, regular rhythm and normal heart sounds.  No murmur heard. No BLE edema. Pulmonary/Chest: Effort normal and breath sounds normal. No respiratory distress. Abdominal: Soft.  There is no tenderness. Muscular skeletal: no synovitis  Psychiatric: Patient has a normal mood and affect. behavior is normal. Judgment and thought content normal.  Recent Results (from the past 2160 hour(s))  TSH     Status: None   Collection Time: 09/09/20  3:01 PM  Result Value Ref Range   TSH 2.49 0.40 - 4.50 mIU/L  Sedimentation rate     Status: Abnormal   Collection Time: 09/09/20  3:01 PM  Result Value Ref Range   Sed Rate 56 (H) 0 - 30 mm/h  C-reactive protein     Status: Abnormal   Collection Time: 09/09/20  3:01 PM  Result Value Ref Range   CRP 9.9 (H) <8.0 mg/L  CBC with Differential/Platelet     Status: Abnormal   Collection Time: 09/09/20  3:01 PM  Result Value Ref Range   WBC 3.8 3.8 - 10.8 Thousand/uL   RBC 3.69 (L) 3.80 - 5.10 Million/uL   Hemoglobin 9.5 (L) 11.7 - 15.5 g/dL   HCT 30.1 (L) 35.0 - 45.0 %   MCV 81.6 80.0 - 100.0 fL   MCH 25.7 (L) 27.0 - 33.0 pg   MCHC 31.6 (L) 32.0 -  36.0 g/dL   RDW 14.2 11.0 - 15.0 %   Platelets 239 140 - 400 Thousand/uL   MPV 11.0 7.5 - 12.5 fL   Neutro Abs 1,208 (L) 1,500 - 7,800 cells/uL   Lymphs Abs 2,075 850 - 3,900 cells/uL   Absolute Monocytes 171 (L) 200 - 950 cells/uL   Eosinophils Absolute 308 15 - 500 cells/uL   Basophils Absolute 38 0 - 200 cells/uL   Neutrophils Relative % 31.8 %   Total Lymphocyte 54.6 %   Monocytes Relative 4.5 %   Eosinophils Relative 8.1 %   Basophils Relative 1.0 %  COMPLETE METABOLIC PANEL WITH GFR     Status: None   Collection  Time: 09/09/20  3:01 PM  Result Value Ref Range   Glucose, Bld 82 65 - 99 mg/dL    Comment: .            Fasting reference interval .    BUN 10 7 - 25 mg/dL   Creat 0.86 0.50 - 0.99 mg/dL    Comment: For patients >3 years of age, the reference limit for Creatinine is approximately 13% higher for people identified as African-American. .    GFR, Est Non African American 72 > OR = 60 mL/min/1.30m2   GFR, Est African American 84 > OR = 60 mL/min/1.8m2   BUN/Creatinine Ratio NOT APPLICABLE 6 - 22 (calc)   Sodium 141 135 - 146 mmol/L   Potassium 4.9 3.5 - 5.3 mmol/L   Chloride 106 98 - 110 mmol/L   CO2 27 20 - 32 mmol/L   Calcium 9.5 8.6 - 10.4 mg/dL   Total Protein 7.2 6.1 - 8.1 g/dL   Albumin 4.4 3.6 - 5.1 g/dL   Globulin 2.8 1.9 - 3.7 g/dL (calc)   AG Ratio 1.6 1.0 - 2.5 (calc)   Total Bilirubin 0.5 0.2 - 1.2 mg/dL   Alkaline phosphatase (APISO) 54 37 - 153 U/L   AST 26 10 - 35 U/L   ALT 17 6 - 29 U/L  ANA,IFA Sjogrens' Pnl rflx Tit/Patn     Status: Abnormal   Collection Time: 09/09/20  3:01 PM  Result Value Ref Range   Anti Nuclear Antibody (ANA) POSITIVE (A) NEGATIVE    Comment: ANA IFA is a first line screen for detecting the presence of up to approximately 150 autoantibodies in various autoimmune diseases. A positive ANA IFA result is suggestive of autoimmune disease and reflexes to titer and pattern. Further laboratory testing may be considered  if clinically indicated. . For additional information, please refer to http://education.[facility].com/faq/FAQ177 (This link is being provided for informational/ educational purposes only.) .    SSA (Ro) (ENA) Antibody, IgG <1.0 NEG <1.0 NEG AI   SSB (La) (ENA) Antibody, IgG <1.0 NEG <1.0 NEG AI   Rhuematoid fact SerPl-aCnc <14 <14 IU/mL   INTERPRETATION      Comment: . A negative rheumatoid factor result does not support the diagnosis of rheumatoid arthritis. An assessment of clinical signs and symptoms is required for a definitive diagnosis. The positive ANA, IFA with negative results for the SS-A and SS-B antibodies indicates that other (unordered) antibody marker(s) for rheumatologic disease is/are positive. Consideration should be given to ordering other rheumatologic disease antibodies depending upon the clinical symptoms. .   Hemoglobin A1c     Status: Abnormal   Collection Time: 09/09/20  3:01 PM  Result Value Ref Range   Hgb A1c MFr Bld 5.9 (H) <5.7 % of total Hgb    Comment: For someone without known diabetes, a hemoglobin  A1c value between 5.7% and 6.4% is consistent with prediabetes and should be confirmed with a  follow-up test. . For someone with known diabetes, a value <7% indicates that their diabetes is well controlled. A1c targets should be individualized based on duration of diabetes, age, comorbid conditions, and other considerations. . This assay result is consistent with an increased risk of diabetes. . Currently, no consensus exists regarding use of hemoglobin A1c for diagnosis of diabetes for children. .    Mean Plasma Glucose 123 (calc)   eAG (mmol/L) 6.8 (calc)  Lipid panel     Status: Abnormal   Collection Time: 09/09/20  3:01 PM  Result Value Ref Range   Cholesterol 252 (H) <200 mg/dL   HDL 74 > OR = 50 mg/dL   Triglycerides 78 <150 mg/dL   LDL Cholesterol (Calc) 160 (H) mg/dL (calc)    Comment: Reference range:  <100 . Desirable range <100 mg/dL for primary prevention;   <70 mg/dL for patients with CHD or diabetic patients  with > or = 2 CHD risk factors. Marland Kitchen LDL-C is now calculated using the Martin-Hopkins  calculation, which is a validated novel method providing  better accuracy than the Friedewald equation in the  estimation of LDL-C.  Cresenciano Genre et al. Annamaria Helling. [id number]): 2061-2068  (http://education.[facility].com/faq/FAQ164)    Total CHOL/HDL Ratio 3.4 <5.0 (calc)   Non-HDL Cholesterol (Calc) 178 (H) <130 mg/dL (calc)    Comment: For patients with diabetes plus 1 major ASCVD risk  factor, treating to a non-HDL-C goal of <100 mg/dL  (LDL-C of <70 mg/dL) is considered a therapeutic  option.   Vitamin B12     Status: None   Collection Time: 09/09/20  3:01 PM  Result Value Ref Range   Vitamin B-12 432 200 - 1,100 pg/mL  Anti-nuclear ab-titer (ANA titer)     Status: Abnormal   Collection Time: 09/09/20  3:01 PM  Result Value Ref Range   ANA Titer 1 1:40 (H) titer    Comment: A low level ANA titer may be present in pre-clinical autoimmune diseases and normal individuals.                 Reference Range                 <1:40        Negative                 1:40-1:80    Low Antibody Level                 >1:80        Elevated Antibody Level .    ANA Pattern 1 Nuclear, Dense Fine Speckled (A)     Comment: Dense fine speckled pattern is seen in normal individuals and rarely associated with systemic lupus erythematosis (SLE), Sjogren's syndrome and systemic sclerosis. . AC-2: Dense Fine Speckled . International Consensus on ANA Patterns (https://www.hernandez-brewer.com/)       PHQ2/9: Depression screen [facility] 2/9 11/21/2020 09/09/2020 11/28/2019 09/06/2019 08/16/2019  Decreased Interest 0 0 0 0 0  Down, Depressed, Hopeless 0 0 0 0 0  PHQ - 2 Score 0 0 0 0 0  Altered sleeping - - 0 0 0  Tired, decreased energy - - 0 0 0  Change in appetite - - 0 0 0  Feeling bad or  failure about yourself  - - 0 0 0  Trouble concentrating - - 0 0 0  Moving slowly or fidgety/restless - - 0 0 0  Suicidal thoughts - - 0 0 0  PHQ-9 Score - - 0 0 0  Difficult doing work/chores - - - - Not difficult at all  Some recent data might be hidden    phq 9 is negative   Fall Risk: Fall Risk  11/21/2020 09/09/2020 11/28/2019 09/06/2019 08/16/2019  Falls in the past year? 0 0 0 0 0  Number falls in past yr: 0 0 0 0 0  Injury with Fall? 0 1 0 0 0  Follow up Falls evaluation completed - - - -      Functional Status Survey: Is the patient deaf or  have difficulty hearing?: No Does the patient have difficulty seeing, even when wearing glasses/contacts?: No Does the patient have difficulty concentrating, remembering, or making decisions?: No Does the patient have difficulty walking or climbing stairs?: Yes Does the patient have difficulty dressing or bathing?: No Does the patient have difficulty doing errands alone such as visiting a doctor's office or shopping?: No    Assessment & Plan  1. Positive ANA (antinuclear antibody)  - Sedimentation rate - C-reactive protein  2. Raynaud's phenomenon without gangrene   3. Connective tissue disease ([place])  Taking Tylenol and seems to be helping with pain now  4. Anemia, unspecified type  - CBC with Differential/Platelet - Iron, TIBC and Ferritin Panel  5. Mild episode of recurrent major depressive disorder ([place])

## 2020-11-22 LAB — CBC WITH DIFFERENTIAL/PLATELET
Absolute Monocytes: 211 cells/uL (ref 200–950)
Basophils Absolute: 41 cells/uL (ref 0–200)
Basophils Relative: 1.2 %
Eosinophils Absolute: 231 cells/uL (ref 15–500)
Eosinophils Relative: 6.8 %
HCT: 32.2 % — ABNORMAL LOW (ref 35.0–45.0)
Hemoglobin: 10.3 g/dL — ABNORMAL LOW (ref 11.7–15.5)
Lymphs Abs: 1595 cells/uL (ref 850–3900)
MCH: 26.7 pg — ABNORMAL LOW (ref 27.0–33.0)
MCHC: 32 g/dL (ref 32.0–36.0)
MCV: 83.4 fL (ref 80.0–100.0)
MPV: 11.3 fL (ref 7.5–12.5)
Monocytes Relative: 6.2 %
Neutro Abs: 1323 cells/uL — ABNORMAL LOW (ref 1500–7800)
Neutrophils Relative %: 38.9 %
Platelets: 220 10*3/uL (ref 140–400)
RBC: 3.86 10*6/uL (ref 3.80–5.10)
RDW: 13 % (ref 11.0–15.0)
Total Lymphocyte: 46.9 %
WBC: 3.4 10*3/uL — ABNORMAL LOW (ref 3.8–10.8)

## 2020-11-22 LAB — IRON,TIBC AND FERRITIN PANEL
%SAT: 18 % (calc) (ref 16–45)
Ferritin: 95 ng/mL (ref 16–288)
Iron: 47 ug/dL (ref 45–160)
TIBC: 265 mcg/dL (calc) (ref 250–450)

## 2020-11-22 LAB — C-REACTIVE PROTEIN: CRP: 16.4 mg/L — ABNORMAL HIGH (ref <8.0)

## 2020-11-22 LAB — SEDIMENTATION RATE: Sed Rate: 48 mm/h — ABNORMAL HIGH (ref 0–30)

## 2020-11-27 ENCOUNTER — Encounter: Payer: Self-pay | Admitting: Family Medicine

## 2020-12-08 ENCOUNTER — Telehealth: Payer: Self-pay

## 2020-12-08 NOTE — Telephone Encounter (Signed)
Copied from [place] [phone] [id number]. Topic: General - Other >> Dec 08, 2020  9:47 AM Lennox [place] wrote: Reason for CRM: [facility] is calling and would like last blood work to be fax to [phone]

## 2020-12-12 ENCOUNTER — Encounter: Payer: Self-pay | Admitting: Family Medicine

## 2021-01-17 ENCOUNTER — Encounter: Payer: Self-pay | Admitting: Emergency Medicine

## 2021-01-17 ENCOUNTER — Other Ambulatory Visit: Payer: Self-pay

## 2021-01-17 ENCOUNTER — Emergency Department
Admission: EM | Admit: 2021-01-17 | Discharge: 2021-01-17 | Disposition: A | Discharge status: Home / Self Care | Payer: BC Managed Care – PPO | Attending: Emergency Medicine | Admitting: Emergency Medicine

## 2021-01-17 ENCOUNTER — Emergency Department: Payer: BC Managed Care – PPO

## 2021-01-17 DIAGNOSIS — E039 Hypothyroidism, unspecified: Secondary | ICD-10-CM | POA: Insufficient documentation

## 2021-01-17 DIAGNOSIS — J069 Acute upper respiratory infection, unspecified: Secondary | ICD-10-CM | POA: Diagnosis not present

## 2021-01-17 DIAGNOSIS — J9801 Acute bronchospasm: Secondary | ICD-10-CM | POA: Diagnosis not present

## 2021-01-17 DIAGNOSIS — Z7952 Long term (current) use of systemic steroids: Secondary | ICD-10-CM | POA: Diagnosis not present

## 2021-01-17 DIAGNOSIS — Z87891 Personal history of nicotine dependence: Secondary | ICD-10-CM | POA: Diagnosis not present

## 2021-01-17 DIAGNOSIS — R079 Chest pain, unspecified: Secondary | ICD-10-CM | POA: Diagnosis present

## 2021-01-17 DIAGNOSIS — Z79899 Other long term (current) drug therapy: Secondary | ICD-10-CM | POA: Diagnosis not present

## 2021-01-17 LAB — BASIC METABOLIC PANEL
Anion gap: 10 (ref 5–15)
BUN: 9 mg/dL (ref 8–23)
CO2: 25 mmol/L (ref 22–32)
Calcium: 9.2 mg/dL (ref 8.9–10.3)
Chloride: 100 mmol/L (ref 98–111)
Creatinine, Ser: 0.93 mg/dL (ref 0.44–1.00)
GFR, Estimated: >60 mL/min (ref >60)
Glucose, Bld: 94 mg/dL (ref 70–99)
Potassium: 3.9 mmol/L (ref 3.5–5.1)
Sodium: 135 mmol/L (ref 135–145)

## 2021-01-17 LAB — CBC
HCT: 32.5 % — ABNORMAL LOW (ref 36.0–46.0)
Hemoglobin: 10.3 g/dL — ABNORMAL LOW (ref 12.0–15.0)
MCH: 25.8 pg — ABNORMAL LOW (ref 26.0–34.0)
MCHC: 31.7 g/dL (ref 30.0–36.0)
MCV: 81.3 fL (ref 80.0–100.0)
Platelets: 181 10*3/uL (ref 150–400)
RBC: 4 MIL/uL (ref 3.87–5.11)
RDW: 13.4 % (ref 11.5–15.5)
WBC: 2.9 10*3/uL — ABNORMAL LOW (ref 4.0–10.5)
nRBC: 0 % (ref 0.0–0.2)

## 2021-01-17 LAB — TROPONIN I (HIGH SENSITIVITY): Troponin I (High Sensitivity): 3 ng/L (ref <18)

## 2021-01-17 MED ORDER — IPRATROPIUM-ALBUTEROL 0.5-2.5 (3) MG/3ML IN SOLN
3.0000 mL | Freq: Once | RESPIRATORY_TRACT | Status: AC
  Administered 2021-01-17: 3 mL via RESPIRATORY_TRACT
  Filled 2021-01-17: qty 3

## 2021-01-17 MED ORDER — PREDNISONE 50 MG PO TABS: 50.0000 mg | ORAL_TABLET | Freq: Every day | ORAL | 0 refills | Status: DC

## 2021-01-17 NOTE — ED Provider Notes (Signed)
[facility] Emergency Department Provider Note   ____________________________________________    I have reviewed the triage vital signs and the nursing notes.   HISTORY  Chief Complaint Chest Pain     HPI Miranda Stafford is a 65 y.o. female who reports over the last several days she has had a an upper respiratory infection with cough.  Today she woke up with a sense of fullness or tightness in her chest.  She denies pain or heaviness.  No radiation.  No shortness of breath.  She reports a history of asthma.  No nausea vomiting or diaphoresis.  Has not take anything for this.  Took 2 Covid tests at home which were both negative.  Past Medical History:  Diagnosis Date  . Allergy   . Anemia   . Arthritis   . Asthma   . Complication of anesthesia    slow to wake  . Fibroid tumor   . Herpes zoster   . Hyperlipidemia   . Hypothyroidism   . Insomnia   . Osteopenia   . Thyroid disease   . Wears dentures    partial upper and lower    Patient Active Problem List   Diagnosis Date Noted  . Connective tissue disease ([place]) 12/19/2019  . Other neutropenia ([place]) 01/10/2018  . Allergic rhinitis due to allergen 08/03/2017  . B12 deficiency 02/25/2017  . Family history of breast cancer in sister 05/24/2016  . Hypothyroidism 09/19/2015  . Asthma, moderate persistent, poorly-controlled 09/19/2015  . Perennial allergic rhinitis with seasonal variation 09/19/2015  . Major depression in remission ([place]) 09/19/2015    Past Surgical History:  Procedure Laterality Date  . ABDOMINAL HYSTERECTOMY  2005   fibroid tumor  . ADENOIDECTOMY    . CESAREAN SECTION    . ETHMOIDECTOMY Bilateral 02/28/2020   Procedure: TOTAL ETHMOIDECTOMY;  Surgeon: Clyde Canterbury, MD;  Location: [place];  Service: ENT;  Laterality: Bilateral;  . FRONTAL SINUS EXPLORATION Bilateral 02/28/2020   Procedure: FRONTAL SINUS EXPLORATION;  Surgeon: Clyde Canterbury, MD;  Location: [place];  Service: ENT;  Laterality: Bilateral;  . IMAGE GUIDED SINUS SURGERY N/A 02/28/2020   Procedure: IMAGE GUIDED SINUS SURGERY;  Surgeon: Clyde Canterbury, MD;  Location: [place];  Service: ENT;  Laterality: N/A;  . MAXILLARY ANTROSTOMY Bilateral 02/28/2020   Procedure: MAXILLARY ANTROSTOMY WITH TISSUE REMOVAL;  Surgeon: Clyde Canterbury, MD;  Location: [place];  Service: ENT;  Laterality: Bilateral;  . SPHENOIDECTOMY Bilateral 02/28/2020   Procedure: SPHENOIDECTOMY;  Surgeon: Clyde Canterbury, MD;  Location: [place];  Service: ENT;  Laterality: Bilateral;  . TUBAL LIGATION      Prior to Admission medications   Medication Sig Start Date End Date Taking? Authorizing Provider  predniSONE (DELTASONE) 50 MG tablet Take 1 tablet (50 mg total) by mouth daily with breakfast. 01/17/21  Yes Lavonia Drafts, MD  albuterol (VENTOLIN HFA) 108 (90 Base) MCG/ACT inhaler Inhale 2 puffs into the lungs every 4 (four) hours as needed for wheezing or shortness of breath. 09/09/20   Sowles, Drue Stager, MD  fluticasone furoate-vilanterol (BREO ELLIPTA) 100-25 MCG/INH AEPB Inhale 1 puff into the lungs daily. 10/24/18   Steele Sizer, MD  montelukast (SINGULAIR) 10 MG tablet Take 1 tablet (10 mg total) by mouth at bedtime. 09/09/20   Steele Sizer, MD  SYNTHROID 88 MCG tablet TAKE 1 TABLET BY MOUTH DAILY BEFORE BREAKFAST ON MONDAY THROUGH FRIDAY 09/30/20   Steele Sizer, MD  Vitamin D, Ergocalciferol, (DRISDOL)  1.25 MG (50000 UNIT) CAPS capsule Take 1 capsule (50,000 Units total) by mouth every 7 (seven) days. 09/09/20   Steele Sizer, MD     Allergies Aspirin and Nsaids  Family History  Problem Relation Age of Onset  . Heart disease Mother   . Hypertension Mother   . Hyperlipidemia Mother   . SIDS Son   . Breast cancer Sister 1  . Hypothyroidism Sister   . Hypertension Sister     Social History Social History   Tobacco Use  . Smoking status: Former Smoker     Packs/day: 0.50    Years: 5.00    Pack years: 2.50    Types: Cigarettes    Start date: 11/16/1979    Quit date: 09/18/1985    Years since quitting: 35.3  . Smokeless tobacco: Never Used  Vaping Use  . Vaping Use: Never used  Substance Use Topics  . Alcohol use: Yes    Alcohol/week: 0.0 standard drinks    Comment: wine/socially/rarely  . Drug use: No    Review of Systems  Constitutional: No fever/chills Eyes: No visual changes.  ENT: No sore throat. Cardiovascular: As above Respiratory: As above Gastrointestinal: No abdominal pain.  No nausea, no vomiting.   Genitourinary: Negative for dysuria. Musculoskeletal: Negative for back pain. Skin: Negative for rash. Neurological: Negative for headaches or weakness   ____________________________________________   PHYSICAL EXAM:  VITAL SIGNS: ED Triage Vitals  Enc Vitals Group     BP 01/17/21 0838 100/77     Pulse Rate 01/17/21 0838 90     Resp 01/17/21 0838 18     Temp 01/17/21 0838 97.6 F (36.4 C)     Temp Source 01/17/21 0838 Oral     SpO2 01/17/21 0838 97 %     Weight 01/17/21 0835 51.3 kg (113 lb)     Height --      Head Circumference --      Peak Flow --      Pain Score 01/17/21 0943 8     Pain Loc --      Pain Edu? --      Excl. in [place]? --     Constitutional: Alert and oriented. No acute distress.   Nose: No congestion/rhinnorhea. Mouth/Throat: Mucous membranes are moist.   Neck:  Painless ROM Cardiovascular: Normal rate, regular rhythm. Grossly normal heart sounds.  Good peripheral circulation. Respiratory: Normal respiratory effort.  No retractions.  Scattered mild wheezes Gastrointestinal: Soft and nontender. No distention.  No CVA tenderness. Musculoskeletal: No lower extremity tenderness nor edema.  Warm and well perfused Neurologic:  Normal speech and language. No gross focal neurologic deficits are appreciated.  Skin:  Skin is warm, dry and intact. No rash noted. Psychiatric: Mood and affect are  normal. Speech and behavior are normal.  ____________________________________________   LABS (all labs ordered are listed, but only abnormal results are displayed)  Labs Reviewed  CBC - Abnormal; Notable for the following components:      Result Value   WBC 2.9 (*)    Hemoglobin 10.3 (*)    HCT 32.5 (*)    MCH 25.8 (*)    All other components within normal limits  BASIC METABOLIC PANEL  TROPONIN I (HIGH SENSITIVITY)   ____________________________________________  EKG  ED ECG REPORT I, Lavonia Drafts, the attending physician, personally viewed and interpreted this ECG.  Date: 01/17/2021  Rhythm: normal sinus rhythm QRS Axis: normal Intervals: normal ST/T Wave abnormalities: Nonspecific changes   ____________________________________________  RADIOLOGY  Chest x-ray viewed by me, no infiltrate or effusion ____________________________________________   PROCEDURES  Procedure(s) performed: No  Procedures   Critical Care performed: No ____________________________________________   INITIAL IMPRESSION / ASSESSMENT AND PLAN / ED COURSE  Pertinent labs & imaging results that were available during my care of the patient were reviewed by me and considered in my medical decision making (see chart for details).  Patient presents with chest tightness as above, on exam scattered very mild wheezes suspicious for bronchospasm related to upper respiratory infection.  High sensitive troponin is normal.  Patient has chronically low white blood cell count.  Chest x-ray unremarkable, reviewed by me.  Patient with complete resolution after 2 DuoNeb's.  Will treat with steroid burst Rx, return precautions discussed, otherwise outpatient follow-up as needed    ____________________________________________   FINAL CLINICAL IMPRESSION(S) / ED DIAGNOSES  Final diagnoses:  Bronchospasm, acute  Upper respiratory tract infection, unspecified type        Note:  This  document was prepared using Dragon voice recognition software and may include unintentional dictation errors.   Lavonia Drafts, MD 01/17/21 1139

## 2021-01-17 NOTE — ED Triage Notes (Signed)
Pt in with central "full-feeling" cp that began last night at rest. States she took some Tylenol, no relief. Pt also states she has been feeling dehydrated the past few days. Reports some nausea, but no sob.

## 2021-01-19 ENCOUNTER — Other Ambulatory Visit: Payer: Self-pay | Admitting: Family Medicine

## 2021-01-19 DIAGNOSIS — J4520 Mild intermittent asthma, uncomplicated: Secondary | ICD-10-CM

## 2021-01-19 NOTE — Telephone Encounter (Signed)
Requested Prescriptions  Pending Prescriptions Disp Refills  . albuterol (VENTOLIN HFA) 108 (90 Base) MCG/ACT inhaler [Pharmacy Med Name: ALBUTEROL HFA INH(200 PUFFS)18GM] 18 g 1    Sig: INHALE 2 PUFFS INTO THE LUNGS EVERY 4 HOURS AS NEEDED FOR WHEEZING OR SHORTNESS OF BREATH     Pulmonology:  Beta Agonists Failed - 01/19/2021  9:21 PM      Failed - One inhaler should last at least one month. If the patient is requesting refills earlier, contact the patient to check for uncontrolled symptoms.      Passed - Valid encounter within last 12 months    Recent Outpatient Visits          1 month ago Positive ANA (antinuclear antibody)   [facility] Steele Sizer, MD   4 months ago Positive ANA (antinuclear antibody)   [facility] Steele Sizer, MD   1 year ago Positive ANA (antinuclear antibody)   [facility] Steele Sizer, MD   1 year ago Joint stiffness   [facility] Steele Sizer, MD   1 year ago Positive ANA (antinuclear antibody)   [facility] Steele Sizer, MD

## 2021-01-20 ENCOUNTER — Other Ambulatory Visit: Payer: Self-pay | Admitting: Family Medicine

## 2021-01-20 DIAGNOSIS — J45901 Unspecified asthma with (acute) exacerbation: Secondary | ICD-10-CM

## 2021-01-20 MED ORDER — BREO ELLIPTA 100-25 MCG/INH IN AEPB: 1.0000 | INHALATION_SPRAY | Freq: Every day | RESPIRATORY_TRACT | 1 refills | Status: DC

## 2021-01-20 NOTE — Telephone Encounter (Signed)
   Notes to clinic: requested script has expired  Review for continue use and refill    Requested Prescriptions  Pending Prescriptions Disp Refills   fluticasone furoate-vilanterol (BREO ELLIPTA) 100-25 MCG/INH AEPB 60 each 1    Sig: Inhale 1 puff into the lungs daily.      Pulmonology:  Combination Products Passed - 01/20/2021  8:38 AM      Passed - Valid encounter within last 12 months    Recent Outpatient Visits           2 months ago Positive ANA (antinuclear antibody)   [facility] Steele Sizer, MD   4 months ago Positive ANA (antinuclear antibody)   [facility] Steele Sizer, MD   1 year ago Positive ANA (antinuclear antibody)   [facility] Steele Sizer, MD   1 year ago Joint stiffness   [facility] Steele Sizer, MD   1 year ago Positive ANA (antinuclear antibody)   [facility] Steele Sizer, MD

## 2021-01-20 NOTE — Telephone Encounter (Signed)
Medication Refill - Medication:  fluticasone furoate-vilanterol (BREO ELLIPTA) 100-25 MCG/INH AEPB 60 each 1 10/24/2018    Sig - Route: Inhale 1 puff into the lungs daily. - Inhalation      Has the patient contacted their pharmacy?YES (Agent: If no, request that the patient contact the pharmacy for the refill.) (Agent: If yes, when and what did the pharmacy advise?) Pharmacy is calling on behalf of pt stating pt is requesting   Preferred Pharmacy (with phone number or street name): *Preferred Pharmacies    [place], [place] Phone:  [phone]  Fax:  [phone]     Pharmacy states pt needs this inhaler again, please submit for refill  Agent: Please be advised that RX refills may take up to 3 business days. We ask that you follow-up with your pharmacy.

## 2021-02-05 ENCOUNTER — Other Ambulatory Visit: Payer: Self-pay

## 2021-02-05 DIAGNOSIS — E039 Hypothyroidism, unspecified: Secondary | ICD-10-CM

## 2021-02-05 MED ORDER — SYNTHROID 88 MCG PO TABS
ORAL_TABLET | ORAL | 1 refills | Status: DC
Start: 2021-02-05 — End: 2021-04-23

## 2021-02-12 ENCOUNTER — Ambulatory Visit: Payer: BC Managed Care – PPO | Admitting: Neurology

## 2021-02-12 ENCOUNTER — Encounter: Payer: Self-pay | Admitting: Neurology

## 2021-02-12 ENCOUNTER — Telehealth: Payer: Self-pay | Admitting: *Deleted

## 2021-02-12 NOTE — Telephone Encounter (Signed)
No showed new patient appointment. 

## 2021-03-29 ENCOUNTER — Other Ambulatory Visit: Payer: Self-pay | Admitting: Family Medicine

## 2021-03-29 DIAGNOSIS — E559 Vitamin D deficiency, unspecified: Secondary | ICD-10-CM

## 2021-03-29 NOTE — Telephone Encounter (Signed)
Requested medication (s) are due for refill today: yes  Requested medication (s) are on the active medication list: yes  Last refill:  09/09/20  Future visit scheduled: no  Notes to clinic:  Med not delegated to NT to RF/lab work   Requested Prescriptions  Pending Prescriptions Disp Refills   Vitamin D, Ergocalciferol, (DRISDOL) 1.25 MG (50000 UNIT) CAPS capsule [Pharmacy Med Name: VITAMIN D2 50,000IU (ERGO) CAP RX] 12 capsule 1    Sig: TAKE 1 CAPSULE BY MOUTH EVERY 7 DAYS      Endocrinology:  Vitamins - Vitamin D Supplementation Failed - 03/29/2021 12:28 PM      Failed - 50,000 IU strengths are not delegated      Failed - Phosphate in normal range and within 360 days    No results found for: PHOS        Failed - Vitamin D in normal range and within 360 days    Vit D, 25-Hydroxy  Date Value Ref Range Status  07/18/2019 41 30 - 100 ng/mL Final    Comment:    Vitamin D Status         25-OH Vitamin D: . Deficiency:                    <20 ng/mL Insufficiency:             20 - 29 ng/mL Optimal:                 > or = 30 ng/mL . For 25-OH Vitamin D testing on patients on  D2-supplementation and patients for whom quantitation  of D2 and D3 fractions is required, the [employer](TM) 25-OH VIT D, (D2,D3), LC/MS/MS is recommended: order  code [id number] (patients >58yrs). See Note 1 . Note 1 . For additional information, please refer to  http://education.[facility].com/faq/FAQ199  (This link is being provided for informational/ educational purposes only.)           Passed - Ca in normal range and within 360 days    Calcium  Date Value Ref Range Status  01/17/2021 9.2 8.9 - 10.3 mg/dL Final          Passed - Valid encounter within last 12 months    Recent Outpatient Visits           4 months ago Positive ANA (antinuclear antibody)   [facility] Steele Sizer, MD   6 months ago Positive ANA (antinuclear antibody)   [facility] Steele Sizer, MD   1 year ago Positive ANA (antinuclear antibody)   [facility] Steele Sizer, MD   1 year ago Joint stiffness   [facility] Steele Sizer, MD   1 year ago Positive ANA (antinuclear antibody)   [facility] Steele Sizer, MD

## 2021-03-30 NOTE — Telephone Encounter (Signed)
Last seen 1.7.2022 and no up coming appts

## 2021-03-31 NOTE — Telephone Encounter (Signed)
lvm to inform pt that prescription has been sent to pharmacy & for her to return call to schedule appt

## 2021-04-14 ENCOUNTER — Telehealth: Payer: Self-pay

## 2021-04-14 ENCOUNTER — Other Ambulatory Visit: Payer: Self-pay | Admitting: Family Medicine

## 2021-04-14 DIAGNOSIS — E039 Hypothyroidism, unspecified: Secondary | ICD-10-CM

## 2021-04-14 NOTE — Telephone Encounter (Signed)
Copied from [place] [phone] [id number]. Topic: General - Other >> Apr 14, 2021  4:30 PM Pawlus, Brayton Layman A wrote: Reason for CRM: Pt stated she will be out of SYNTHROID 88 MCG tablet next week, pt wants to know if she can get lab orders placed to get her bloodwork done so she can get a refill before she runs out. There were no open office visits until mid June.

## 2021-04-15 NOTE — Telephone Encounter (Signed)
Pt.notified

## 2021-04-22 LAB — TSH: TSH: 0.62 mIU/L (ref 0.40–4.50)

## 2021-04-23 ENCOUNTER — Other Ambulatory Visit: Payer: Self-pay | Admitting: Family Medicine

## 2021-04-23 DIAGNOSIS — E039 Hypothyroidism, unspecified: Secondary | ICD-10-CM

## 2021-04-23 MED ORDER — SYNTHROID 88 MCG PO TABS
ORAL_TABLET | ORAL | 1 refills | Status: DC
Start: 2021-04-23 — End: 2022-05-21

## 2021-05-05 ENCOUNTER — Other Ambulatory Visit: Payer: Self-pay

## 2021-05-05 ENCOUNTER — Ambulatory Visit (INDEPENDENT_AMBULATORY_CARE_PROVIDER_SITE_OTHER): Payer: BC Managed Care – PPO | Admitting: Family Medicine

## 2021-05-05 ENCOUNTER — Encounter: Payer: Self-pay | Admitting: Family Medicine

## 2021-05-05 VITALS — BP 112/68 | HR 96 | Temp 98.1°F | Resp 14 | Ht 62.0 in | Wt 106.7 lb

## 2021-05-05 DIAGNOSIS — D649 Anemia, unspecified: Secondary | ICD-10-CM

## 2021-05-05 DIAGNOSIS — J3089 Other allergic rhinitis: Secondary | ICD-10-CM | POA: Diagnosis not present

## 2021-05-05 DIAGNOSIS — R7303 Prediabetes: Secondary | ICD-10-CM

## 2021-05-05 DIAGNOSIS — J4520 Mild intermittent asthma, uncomplicated: Secondary | ICD-10-CM | POA: Diagnosis not present

## 2021-05-05 DIAGNOSIS — E559 Vitamin D deficiency, unspecified: Secondary | ICD-10-CM

## 2021-05-05 DIAGNOSIS — R209 Unspecified disturbances of skin sensation: Secondary | ICD-10-CM | POA: Insufficient documentation

## 2021-05-05 DIAGNOSIS — Z1231 Encounter for screening mammogram for malignant neoplasm of breast: Secondary | ICD-10-CM

## 2021-05-05 DIAGNOSIS — E039 Hypothyroidism, unspecified: Secondary | ICD-10-CM

## 2021-05-05 DIAGNOSIS — J45901 Unspecified asthma with (acute) exacerbation: Secondary | ICD-10-CM

## 2021-05-05 MED ORDER — FLUTICASONE FUROATE-VILANTEROL 100-25 MCG/INH IN AEPB: 1.0000 | INHALATION_SPRAY | Freq: Every day | RESPIRATORY_TRACT | 1 refills | Status: DC

## 2021-05-05 MED ORDER — MONTELUKAST SODIUM 10 MG PO TABS: 10.0000 mg | ORAL_TABLET | Freq: Every day | ORAL | 1 refills | Status: DC

## 2021-05-05 MED ORDER — ALBUTEROL SULFATE HFA 108 (90 BASE) MCG/ACT IN AERS
2.0000 | INHALATION_SPRAY | RESPIRATORY_TRACT | 2 refills | Status: DC | PRN
Start: 2021-05-05 — End: 2022-08-16

## 2021-05-05 NOTE — Progress Notes (Signed)
Name: Miranda Stafford   MRN: [id number]    DOB: [birth year]   Date:05/05/2021       Progress Note  Chief Complaint  Patient presents with   Medication Refill    Subjective:   Miranda Stafford is a 65 y.o. female, presents to clinic for hypothyroid f/up and med refill  Hypothyroidism: Labs done recently, she takes 34 mcg 5 d a week and doesn't take on sat and Sun, she will be symptomatic if she misses mondays dose or she runs out of meds, she is here for f/up, meds were previously refilled by PCP for 6 months Current Symptoms: denies fatigue, weight changes, heat/cold intolerance, bowel/skin changes or CVS symptoms Lab Results  Component Value Date   TSH 0.62 04/21/2021   Chronic anemia - worked up extensively in the past -  H/H is baseline and stable Hemoglobin  Date Value Ref Range Status  01/17/2021 10.3 (L) 12.0 - 15.0 g/dL Final  11/21/2020 10.3 (L) 11.7 - 15.5 g/dL Final  09/09/2020 9.5 (L) 11.7 - 15.5 g/dL Final  11/28/2019 10.4 (L) 11.7 - 15.5 g/dL Final  Pt states previously saw hematology, oncology, had bone marrow biopsy - last labs showed chronic anemia w/o iron deficiency   Autoimmune markers positive - following with PCP, she has seen rheumatology in the past no definitive dx, joint pain to hands and LE, fatigue often  Asthma - needs refills On Breo - not taking regularly, not taking right now, but continuing montelukast 05/05/2021    1215   Asthma History   Symptoms 0-2 days/week  Nighttime Awakenings 3-4/month  Asthma interference with normal activity Minor limitations  SABA use (not for EIB) 0-2 days/wk  Risk: Exacerbations requiring oral systemic steroids 0-1 / year  Asthma Severity Intermittent   HLD  not on meds, last done last Oct Prediabetic Lab Results  Component Value Date   HGBA1C 5.9 (H) 09/09/2020  She wants to wait to do labs until next PCP f/up visit  Last vitamin D Vit D deficiency - on Rx strength dose Lab Results  Component Value Date    VD25OH 41 07/18/2019      Current Outpatient Medications:    albuterol (VENTOLIN HFA) 108 (90 Base) MCG/ACT inhaler, INHALE 2 PUFFS INTO THE LUNGS EVERY 4 HOURS AS NEEDED FOR WHEEZING OR SHORTNESS OF BREATH, Disp: 18 g, Rfl: 1   fluticasone furoate-vilanterol (BREO ELLIPTA) 100-25 MCG/INH AEPB, Inhale 1 puff into the lungs daily., Disp: 60 each, Rfl: 1   montelukast (SINGULAIR) 10 MG tablet, Take 1 tablet (10 mg total) by mouth at bedtime., Disp: 90 tablet, Rfl: 1   predniSONE (DELTASONE) 50 MG tablet, Take 1 tablet (50 mg total) by mouth daily with breakfast., Disp: 4 tablet, Rfl: 0   SYNTHROID 88 MCG tablet, TAKE 1 TABLET BY MOUTH DAILY BEFORE BREAKFAST ON MONDAY THROUGH FRIDAY, Disp: 90 tablet, Rfl: 1   Vitamin D, Ergocalciferol, (DRISDOL) 1.25 MG (50000 UNIT) CAPS capsule, TAKE 1 CAPSULE BY MOUTH EVERY 7 DAYS, Disp: 12 capsule, Rfl: 0  Patient Active Problem List   Diagnosis Date Noted   Skin sensation disturbance 05/05/2021   Connective tissue disease ([place]) 12/19/2019   Other neutropenia ([place]) 01/10/2018   Allergic rhinitis due to allergen 08/03/2017   B12 deficiency 02/25/2017   Family history of breast cancer in sister 05/24/2016   Hypothyroidism 09/19/2015   Asthma, moderate persistent, poorly-controlled 09/19/2015   Perennial allergic rhinitis with seasonal variation 09/19/2015   Major depression in remission ([place]) 09/19/2015  Past Surgical History:  Procedure Laterality Date   ABDOMINAL HYSTERECTOMY  2005   fibroid tumor   ADENOIDECTOMY     CESAREAN SECTION     ETHMOIDECTOMY Bilateral 02/28/2020   Procedure: TOTAL ETHMOIDECTOMY;  Surgeon: Clyde Canterbury, MD;  Location: [place];  Service: ENT;  Laterality: Bilateral;   FRONTAL SINUS EXPLORATION Bilateral 02/28/2020   Procedure: FRONTAL SINUS EXPLORATION;  Surgeon: Clyde Canterbury, MD;  Location: [place];  Service: ENT;  Laterality: Bilateral;   IMAGE GUIDED SINUS SURGERY N/A 02/28/2020    Procedure: IMAGE GUIDED SINUS SURGERY;  Surgeon: Clyde Canterbury, MD;  Location: [place];  Service: ENT;  Laterality: N/A;   MAXILLARY ANTROSTOMY Bilateral 02/28/2020   Procedure: MAXILLARY ANTROSTOMY WITH TISSUE REMOVAL;  Surgeon: Clyde Canterbury, MD;  Location: [place];  Service: ENT;  Laterality: Bilateral;   SPHENOIDECTOMY Bilateral 02/28/2020   Procedure: Coralee Pesa;  Surgeon: Clyde Canterbury, MD;  Location: [place];  Service: ENT;  Laterality: Bilateral;   TUBAL LIGATION      Family History  Problem Relation Age of Onset   Heart disease Mother    Hypertension Mother    Hyperlipidemia Mother    SIDS Son    Breast cancer Sister 27   Hypothyroidism Sister    Hypertension Sister     Social History   Tobacco Use   Smoking status: Former    Packs/day: 0.50    Years: 5.00    Pack years: 2.50    Types: Cigarettes    Start date: 11/16/1979    Quit date: 09/18/1985    Years since quitting: 35.6   Smokeless tobacco: Never  Vaping Use   Vaping Use: Never used  Substance Use Topics   Alcohol use: Yes    Alcohol/week: 0.0 standard drinks    Comment: wine/socially/rarely   Drug use: No     Allergies  Allergen Reactions   Aspirin Nausea And Vomiting   Nsaids     Nausea and vomiting     Health Maintenance  Topic Date Due   MAMMOGRAM  04/06/2020   PAP SMEAR-Modifier  05/05/2021 (Originally 08/03/2020)   COVID-19 Vaccine (3 - Pfizer risk series) 05/21/2021 (Originally 03/04/2020)   Zoster Vaccines- Shingrix (1 of 2) 08/05/2021 (Originally 03/28/1977)   Pneumococcal Vaccine 4-83 Years old (1 - PCV) 05/05/2022 (Originally 03/28/1964)   INFLUENZA VACCINE  06/15/2021   TETANUS/TDAP  07/03/2022   COLONOSCOPY (Pts 45-62yr Insurance coverage will need to be confirmed)  06/25/2024   Hepatitis C Screening  Completed   HIV Screening  Completed   HPV VACCINES  Aged Out    Chart Review Today: I personally reviewed active problem list, medication list,  allergies, family history, social history, health maintenance, notes from last encounter, lab results, imaging with the patient/caregiver today.  Review of Systems  Constitutional: Negative.   HENT: Negative.    Eyes: Negative.   Respiratory: Negative.    Cardiovascular: Negative.   Gastrointestinal: Negative.   Endocrine: Negative.   Genitourinary: Negative.   Musculoskeletal: Negative.   Skin: Negative.   Allergic/Immunologic: Negative.   Neurological: Negative.   Hematological: Negative.   Psychiatric/Behavioral: Negative.    All other systems reviewed and are negative.   Objective:   Vitals:   05/05/21 1135  BP: 112/68  Pulse: 96  Resp: 14  Temp: 98.1 F (36.7 C)  TempSrc: Oral  SpO2: 98%  Weight: 106 lb 11.2 oz (48.4 kg)  Height: _0  (1.575 m)    Body mass  index is 19.52 kg/m.  Physical Exam Vitals and nursing note reviewed.  Constitutional:      General: She is not in acute distress.    Appearance: Normal appearance. She is well-developed. She is not ill-appearing, toxic-appearing or diaphoretic.     Interventions: Face mask in place.  HENT:     Head: Normocephalic and atraumatic.     Right Ear: External ear normal.     Left Ear: External ear normal.  Eyes:     General: Lids are normal. No scleral icterus.       Right eye: No discharge.        Left eye: No discharge.     Conjunctiva/sclera: Conjunctivae normal.  Neck:     Trachea: Phonation normal. No tracheal deviation.  Cardiovascular:     Rate and Rhythm: Normal rate and regular rhythm.     Pulses: Normal pulses.          Radial pulses are 2+ on the right side and 2+ on the left side.       Posterior tibial pulses are 2+ on the right side and 2+ on the left side.     Heart sounds: Normal heart sounds. No murmur heard.   No friction rub. No gallop.  Pulmonary:     Effort: Pulmonary effort is normal. No respiratory distress.     Breath sounds: Normal breath sounds. No stridor. No wheezing,  rhonchi or rales.  Chest:     Chest wall: No tenderness.  Abdominal:     General: Bowel sounds are normal. There is no distension.     Palpations: Abdomen is soft.  Musculoskeletal:     Right lower leg: No edema.     Left lower leg: No edema.  Skin:    General: Skin is warm and dry.     Coloration: Skin is not jaundiced or pale.     Findings: No rash.  Neurological:     Mental Status: She is alert.     Motor: No abnormal muscle tone.     Gait: Gait normal.  Psychiatric:        Mood and Affect: Mood normal.        Speech: Speech normal.        Behavior: Behavior normal.        Assessment & Plan:     ICD-10-CM   1. Hypothyroidism, adult  E03.9    labs reviewed, TSH in normal range, no concerning sx, meds refilled    2. Mild intermittent asthma without complication  K24.09 albuterol (VENTOLIN HFA) 108 (90 Base) MCG/ACT inhaler    montelukast (SINGULAIR) 10 MG tablet   nighttime waking- I recommended using breo more consistently if nighttime sx every week, otherwise controlled, not using inhaler often, rare exacerbations    3. Mild asthma with exacerbation, unspecified whether persistent  J45.901 fluticasone furoate-vilanterol (BREO ELLIPTA) 100-25 MCG/INH AEPB   meds refilled - see above    4. Non-seasonal allergic rhinitis due to other allergic trigger  J30.89 montelukast (SINGULAIR) 10 MG tablet   meds refilled    5. Anemia, unspecified type  D64.9    H/H stable, asx, pt does not wish to repeat labs today, prior extensive work up, she will f/up with PCP in a few months    6. Pre-diabetes  R73.03    labs reviewed - pt will do labs with f/up appt with Dr. Ancil Boozer    7. Vitamin D deficiency  E55.9    on high dose  supplement, has 2 months left, encouraged daily supplement 1000-2000 IU when finished with Rx, last labs normal Vit D, f/up PCP    8. Breast cancer screening by mammogram  Z12.31 MM Digital Screening       Return for Oct or Nov f/up with PCP with labs -  HLD, thyroid, inflammatory markers A1C.   Delsa Grana, PA-C 05/05/21 12:01 PM

## 2021-06-15 ENCOUNTER — Encounter: Payer: Self-pay | Admitting: Oncology

## 2021-06-15 ENCOUNTER — Other Ambulatory Visit: Payer: Self-pay

## 2021-06-15 ENCOUNTER — Encounter: Payer: Self-pay | Admitting: Family Medicine

## 2021-06-15 ENCOUNTER — Ambulatory Visit (INDEPENDENT_AMBULATORY_CARE_PROVIDER_SITE_OTHER): Payer: BC Managed Care – PPO | Admitting: Family Medicine

## 2021-06-15 VITALS — BP 116/78 | HR 93 | Temp 98.0°F | Resp 16 | Ht 62.0 in | Wt 109.0 lb

## 2021-06-15 DIAGNOSIS — M549 Dorsalgia, unspecified: Secondary | ICD-10-CM | POA: Diagnosis not present

## 2021-06-15 DIAGNOSIS — J3489 Other specified disorders of nose and nasal sinuses: Secondary | ICD-10-CM

## 2021-06-15 MED ORDER — TIZANIDINE HCL 2 MG PO TABS: 2.0000 mg | ORAL_TABLET | Freq: Three times a day (TID) | ORAL | 0 refills | Status: DC | PRN

## 2021-06-15 NOTE — Progress Notes (Signed)
Name: Miranda Stafford   MRN: [id number]    DOB: July 29, 1956   Date:06/15/2021       Progress Note  Subjective  Chief Complaint  Back Pain  HPI  Back pain: patient states she fell from her bed, while reaching to the night stand a few weeks ago. She fell either side ways or back towards the floor, she was wedged between the nightstand and the bed. She states pain was intense and has improved but not resolving. Difficulty sleeping on her right side, also states certain movements intensify the pain. Never had any redness or bruising. No pain with deep inspiration.   During her visit I noticed she was sniffling, she also stated has a mild cough, she was in [place] this past weekend. Offered to check her for COVID but she states she had it about 2 months ago and is not feeling tired, no fever and not interested in getting checked.   Patient Active Problem List   Diagnosis Date Noted   Skin sensation disturbance 05/05/2021   Connective tissue disease ([place]) 12/19/2019   Other neutropenia ([place]) 01/10/2018   Allergic rhinitis due to allergen 08/03/2017   B12 deficiency 02/25/2017   Family history of breast cancer in sister 05/24/2016   Hypothyroidism 09/19/2015   Asthma, moderate persistent, poorly-controlled 09/19/2015   Perennial allergic rhinitis with seasonal variation 09/19/2015   Major depression in remission ([place]) 09/19/2015    Past Surgical History:  Procedure Laterality Date   ABDOMINAL HYSTERECTOMY  2005   fibroid tumor   ADENOIDECTOMY     CESAREAN SECTION     ETHMOIDECTOMY Bilateral 02/28/2020   Procedure: TOTAL ETHMOIDECTOMY;  Surgeon: Clyde Canterbury, MD;  Location: [place];  Service: ENT;  Laterality: Bilateral;   FRONTAL SINUS EXPLORATION Bilateral 02/28/2020   Procedure: FRONTAL SINUS EXPLORATION;  Surgeon: Clyde Canterbury, MD;  Location: [place];  Service: ENT;  Laterality: Bilateral;   IMAGE GUIDED SINUS SURGERY N/A 02/28/2020   Procedure: IMAGE GUIDED  SINUS SURGERY;  Surgeon: Clyde Canterbury, MD;  Location: [place];  Service: ENT;  Laterality: N/A;   MAXILLARY ANTROSTOMY Bilateral 02/28/2020   Procedure: MAXILLARY ANTROSTOMY WITH TISSUE REMOVAL;  Surgeon: Clyde Canterbury, MD;  Location: [facility];  Service: ENT;  Laterality: Bilateral;   SPHENOIDECTOMY Bilateral 02/28/2020   Procedure: Coralee Pesa;  Surgeon: Clyde Canterbury, MD;  Location: [place];  Service: ENT;  Laterality: Bilateral;   TUBAL LIGATION      Family History  Problem Relation Age of Onset   Heart disease Mother    Hypertension Mother    Hyperlipidemia Mother    SIDS Son    Breast cancer Sister 44   Hypothyroidism Sister    Hypertension Sister     Social History   Tobacco Use   Smoking status: Former    Packs/day: 0.50    Years: 5.00    Pack years: 2.50    Types: Cigarettes    Start date: 11/16/1979    Quit date: 09/18/1985    Years since quitting: 35.7   Smokeless tobacco: Never  Substance Use Topics   Alcohol use: Yes    Alcohol/week: 0.0 standard drinks    Comment: wine/socially/rarely     Current Outpatient Medications:    albuterol (VENTOLIN HFA) 108 (90 Base) MCG/ACT inhaler, Inhale 2 puffs into the lungs every 4 (four) hours as needed for wheezing or shortness of breath., Disp: 18 g, Rfl: 2   fluticasone furoate-vilanterol (BREO ELLIPTA) 100-25 MCG/INH AEPB, Inhale  1 puff into the lungs daily., Disp: 60 each, Rfl: 1   montelukast (SINGULAIR) 10 MG tablet, Take 1 tablet (10 mg total) by mouth at bedtime., Disp: 90 tablet, Rfl: 1   SYNTHROID 88 MCG tablet, TAKE 1 TABLET BY MOUTH DAILY BEFORE BREAKFAST ON MONDAY THROUGH FRIDAY, Disp: 90 tablet, Rfl: 1   Vitamin D, Ergocalciferol, (DRISDOL) 1.25 MG (50000 UNIT) CAPS capsule, TAKE 1 CAPSULE BY MOUTH EVERY 7 DAYS, Disp: 12 capsule, Rfl: 0  Allergies  Allergen Reactions   Aspirin Nausea And Vomiting   Nsaids     Nausea and vomiting     I personally reviewed active problem  list, medication list, allergies, family history, social history, health maintenance with the patient/caregiver today.   ROS  Ten systems reviewed and is negative except as mentioned in HPI   Objective  Vitals:   06/15/21 1526  BP: 116/78  Pulse: 93  Resp: 16  Temp: 98 F (36.7 C)  SpO2: 99%  Weight: 109 lb (49.4 kg)  Height: '5\' 2"'$  (1.575 m)    Body mass index is 19.94 kg/m.  Physical Exam  Constitutional: Patient appears well-developed and well-nourished. Petite  No distress.  HEENT: head atraumatic, normocephalic, pupils equal and reactive to light, ears normal bilaterally , neck supple, Oral exam not done  Cardiovascular: Normal rate, regular rhythm and normal heart sounds.  No murmur heard. No BLE edema. Pulmonary/Chest: Effort normal and breath sounds normal. No respiratory distress. Abdominal: Soft.  There is no tenderness. Muscular skeletal: tenderness during palpation of right scapular area and also below scapula towards posterior axillary line. Normal palpation of spinal processes, normal empty can sign, normal rom of shoulder but had some pain on back with abduction  Psychiatric: Patient has a normal mood and affect. behavior is normal. Judgment and thought content normal.   Recent Results (from the past 2160 hour(s))  TSH     Status: None   Collection Time: 04/21/21 10:20 AM  Result Value Ref Range   TSH 0.62 0.40 - 4.50 mIU/L     PHQ2/9: Depression screen [facility] 2/9 06/15/2021 05/05/2021 11/21/2020 09/09/2020 11/28/2019  Decreased Interest 0 0 0 0 0  Down, Depressed, Hopeless 0 0 0 0 0  PHQ - 2 Score 0 0 0 0 0  Altered sleeping 0 - - - 0  Tired, decreased energy 0 - - - 0  Change in appetite 0 - - - 0  Feeling bad or failure about yourself  0 - - - 0  Trouble concentrating 0 - - - 0  Moving slowly or fidgety/restless 0 - - - 0  Suicidal thoughts 0 - - - 0  PHQ-9 Score 0 - - - 0  Difficult doing work/chores - - - - -  Some recent data might be hidden    phq  9 is negative   Fall Risk: Fall Risk  06/15/2021 05/05/2021 11/21/2020 09/09/2020 11/28/2019  Falls in the past year? 1 0 0 0 0  Number falls in past yr: 0 0 0 0 0  Injury with Fall? 0 0 0 1 0  Follow up - Falls prevention discussed Falls evaluation completed - -      Functional Status Survey: Is the patient deaf or have difficulty hearing?: No Does the patient have difficulty seeing, even when wearing glasses/contacts?: No Does the patient have difficulty concentrating, remembering, or making decisions?: No Does the patient have difficulty walking or climbing stairs?: No Does the patient have difficulty dressing or bathing?:  No Does the patient have difficulty doing errands alone such as visiting a doctor's office or shopping?: No    Assessment & Plan  1. Upper back pain  - DG Ribs Unilateral Right; Future - tiZANidine (ZANAFLEX) 2 MG tablet; Take 1 tablet (2 mg total) by mouth every 8 (eight) hours as needed for muscle spasms.  Dispense: 40 tablet; Refill: 0  2. Rhinorrhea  Discussed covid-19 test but she refused

## 2021-09-04 ENCOUNTER — Other Ambulatory Visit: Payer: Self-pay | Admitting: Family Medicine

## 2021-09-04 DIAGNOSIS — Z1231 Encounter for screening mammogram for malignant neoplasm of breast: Secondary | ICD-10-CM

## 2021-09-24 ENCOUNTER — Ambulatory Visit
Admission: RE | Admit: 2021-09-24 | Discharge: 2021-09-24 | Disposition: A | Discharge status: Home / Self Care | Payer: BC Managed Care – PPO | Source: Ambulatory Visit | Attending: Family Medicine | Admitting: Family Medicine

## 2021-09-24 ENCOUNTER — Encounter: Payer: Self-pay | Admitting: Oncology

## 2021-09-24 ENCOUNTER — Other Ambulatory Visit: Payer: Self-pay

## 2021-09-24 DIAGNOSIS — Z1231 Encounter for screening mammogram for malignant neoplasm of breast: Secondary | ICD-10-CM | POA: Diagnosis present

## 2021-10-25 ENCOUNTER — Emergency Department: Payer: BC Managed Care – PPO

## 2021-10-25 ENCOUNTER — Other Ambulatory Visit: Payer: Self-pay

## 2021-10-25 DIAGNOSIS — J4540 Moderate persistent asthma, uncomplicated: Secondary | ICD-10-CM | POA: Insufficient documentation

## 2021-10-25 DIAGNOSIS — Z79899 Other long term (current) drug therapy: Secondary | ICD-10-CM | POA: Insufficient documentation

## 2021-10-25 DIAGNOSIS — E039 Hypothyroidism, unspecified: Secondary | ICD-10-CM | POA: Diagnosis not present

## 2021-10-25 DIAGNOSIS — M545 Low back pain, unspecified: Secondary | ICD-10-CM | POA: Insufficient documentation

## 2021-10-25 DIAGNOSIS — Z87891 Personal history of nicotine dependence: Secondary | ICD-10-CM | POA: Insufficient documentation

## 2021-10-25 DIAGNOSIS — R109 Unspecified abdominal pain: Secondary | ICD-10-CM | POA: Diagnosis present

## 2021-10-25 DIAGNOSIS — Z7951 Long term (current) use of inhaled steroids: Secondary | ICD-10-CM | POA: Insufficient documentation

## 2021-10-25 DIAGNOSIS — R1031 Right lower quadrant pain: Secondary | ICD-10-CM | POA: Diagnosis not present

## 2021-10-25 DIAGNOSIS — K5900 Constipation, unspecified: Secondary | ICD-10-CM | POA: Insufficient documentation

## 2021-10-25 MED ORDER — ONDANSETRON HCL 4 MG/2ML IJ SOLN: 4.0000 mg | Freq: Once | INTRAMUSCULAR | Status: DC

## 2021-10-25 MED ORDER — SODIUM CHLORIDE 0.9 % IV BOLUS
1000.0000 mL | Freq: Once | INTRAVENOUS | Status: AC
  Administered 2021-10-26: 1000 mL via INTRAVENOUS

## 2021-10-25 MED ORDER — MORPHINE SULFATE (PF) 4 MG/ML IV SOLN
4.0000 mg | Freq: Once | INTRAVENOUS | Status: AC
Start: 2021-10-25 — End: 2021-10-25
  Administered 2021-10-25: 4 mg via INTRAVENOUS
  Filled 2021-10-25: qty 1

## 2021-10-25 NOTE — ED Triage Notes (Signed)
Pt arrives with c/o right sided flank pain that started this morning. Pt denies painful urination/frequency or n/v.

## 2021-10-26 ENCOUNTER — Emergency Department
Admission: EM | Admit: 2021-10-26 | Discharge: 2021-10-26 | Disposition: A | Discharge status: Home / Self Care | Payer: BC Managed Care – PPO | Attending: Emergency Medicine | Admitting: Emergency Medicine

## 2021-10-26 DIAGNOSIS — R109 Unspecified abdominal pain: Secondary | ICD-10-CM

## 2021-10-26 DIAGNOSIS — R1031 Right lower quadrant pain: Secondary | ICD-10-CM

## 2021-10-26 LAB — CBC
HCT: 34.5 % — ABNORMAL LOW (ref 36.0–46.0)
Hemoglobin: 11 g/dL — ABNORMAL LOW (ref 12.0–15.0)
MCH: 26.1 pg (ref 26.0–34.0)
MCHC: 31.9 g/dL (ref 30.0–36.0)
MCV: 81.8 fL (ref 80.0–100.0)
Platelets: 215 10*3/uL (ref 150–400)
RBC: 4.22 MIL/uL (ref 3.87–5.11)
RDW: 13.6 % (ref 11.5–15.5)
WBC: 4.3 10*3/uL (ref 4.0–10.5)
nRBC: 0 % (ref 0.0–0.2)

## 2021-10-26 LAB — URINALYSIS, ROUTINE W REFLEX MICROSCOPIC
Bilirubin Urine: NEGATIVE
Glucose, UA: NEGATIVE mg/dL
Hgb urine dipstick: NEGATIVE
Ketones, ur: NEGATIVE mg/dL
Leukocytes,Ua: NEGATIVE
Nitrite: NEGATIVE
Protein, ur: NEGATIVE mg/dL
Specific Gravity, Urine: 1.005 (ref 1.005–1.030)
pH: 8 (ref 5.0–8.0)

## 2021-10-26 LAB — COMPREHENSIVE METABOLIC PANEL
ALT: 13 U/L (ref 0–44)
AST: 26 U/L (ref 15–41)
Albumin: 4.1 g/dL (ref 3.5–5.0)
Alkaline Phosphatase: 59 U/L (ref 38–126)
Anion gap: 7 (ref 5–15)
BUN: 11 mg/dL (ref 8–23)
CO2: 26 mmol/L (ref 22–32)
Calcium: 9.2 mg/dL (ref 8.9–10.3)
Chloride: 106 mmol/L (ref 98–111)
Creatinine, Ser: 0.94 mg/dL (ref 0.44–1.00)
GFR, Estimated: >60 mL/min (ref >60)
Glucose, Bld: 84 mg/dL (ref 70–99)
Potassium: 4.2 mmol/L (ref 3.5–5.1)
Sodium: 139 mmol/L (ref 135–145)
Total Bilirubin: 0.5 mg/dL (ref 0.3–1.2)
Total Protein: 7.7 g/dL (ref 6.5–8.1)

## 2021-10-26 MED ORDER — IBUPROFEN 400 MG PO TABS: 400.0000 mg | ORAL_TABLET | Freq: Once | ORAL | Status: DC

## 2021-10-26 NOTE — Discharge Instructions (Addendum)
Your blood work and CAT scan were all reassuring today.  Your appendix appeared normal on the CAT scan without contrast.  If your pain is worsening or you develop any new or concerning symptoms to you such as nausea and vomiting, fevers or worsening pain, please return to the emergency department.  Take NSAIDs including ibuprofen, Motrin, Advil etc. for your pain.

## 2021-10-26 NOTE — ED Provider Notes (Signed)
[facility]  ____________________________________________   Event Date/Time   First MD Initiated Contact with Patient 10/26/21 [phone]     (approximate)  I have reviewed the triage vital signs and the nursing notes.   HISTORY  Chief Complaint Flank Pain    HPI Miranda Stafford is a 65 y.o. female with past medical history of hyperlipidemia, hypothyroidism, anemia, arthritis who presents with back/abdominal pain.  Patient woke this morning with pain in her lower back.  Was in the bilateral lower back.  She took an over-the-counter pain medication and the back pain has now subsided but she still has some right-sided abdominal pain.  She denies any urinary symptoms including dysuria, urgency frequency or hematuria.  She denies nausea vomiting abdominal pain.  Does endorse some constipation.  She denies fevers or chills.  Has no history of similar pain.  No radiation of pain down the legs or weakness in her lower extremities.         Past Medical History:  Diagnosis Date   Allergy    Anemia    Arthritis    Asthma    Complication of anesthesia    slow to wake   Fibroid tumor    Herpes zoster    Hyperlipidemia    Hypothyroidism    Insomnia    Osteopenia    Thyroid disease    Wears dentures    partial upper and lower    Patient Active Problem List   Diagnosis Date Noted   Skin sensation disturbance 05/05/2021   Connective tissue disease ([place]) 12/19/2019   Other neutropenia ([place]) 01/10/2018   Allergic rhinitis due to allergen 08/03/2017   B12 deficiency 02/25/2017   Family history of breast cancer in sister 05/24/2016   Hypothyroidism 09/19/2015   Asthma, moderate persistent, poorly-controlled 09/19/2015   Perennial allergic rhinitis with seasonal variation 09/19/2015   Major depression in remission ([place]) 09/19/2015    Past Surgical History:  Procedure Laterality Date   ABDOMINAL HYSTERECTOMY  2005   fibroid tumor   ADENOIDECTOMY     CESAREAN  SECTION     ETHMOIDECTOMY Bilateral 02/28/2020   Procedure: TOTAL ETHMOIDECTOMY;  Surgeon: Clyde Canterbury, MD;  Location: [place];  Service: ENT;  Laterality: Bilateral;   FRONTAL SINUS EXPLORATION Bilateral 02/28/2020   Procedure: FRONTAL SINUS EXPLORATION;  Surgeon: Clyde Canterbury, MD;  Location: [place];  Service: ENT;  Laterality: Bilateral;   IMAGE GUIDED SINUS SURGERY N/A 02/28/2020   Procedure: IMAGE GUIDED SINUS SURGERY;  Surgeon: Clyde Canterbury, MD;  Location: [place];  Service: ENT;  Laterality: N/A;   MAXILLARY ANTROSTOMY Bilateral 02/28/2020   Procedure: MAXILLARY ANTROSTOMY WITH TISSUE REMOVAL;  Surgeon: Clyde Canterbury, MD;  Location: [place];  Service: ENT;  Laterality: Bilateral;   SPHENOIDECTOMY Bilateral 02/28/2020   Procedure: Coralee Pesa;  Surgeon: Clyde Canterbury, MD;  Location: [place];  Service: ENT;  Laterality: Bilateral;   TUBAL LIGATION      Prior to Admission medications   Medication Sig Start Date End Date Taking? Authorizing Provider  albuterol (VENTOLIN HFA) 108 (90 Base) MCG/ACT inhaler Inhale 2 puffs into the lungs every 4 (four) hours as needed for wheezing or shortness of breath. 05/05/21   Delsa Grana, PA-C  fluticasone furoate-vilanterol (BREO ELLIPTA) 100-25 MCG/INH AEPB Inhale 1 puff into the lungs daily. 05/05/21   Delsa Grana, PA-C  montelukast (SINGULAIR) 10 MG tablet Take 1 tablet (10 mg total) by mouth at bedtime. 05/05/21   Delsa Grana, PA-C  SYNTHROID 88 MCG tablet TAKE 1 TABLET BY MOUTH DAILY BEFORE BREAKFAST ON MONDAY THROUGH FRIDAY 04/23/21   Steele Sizer, MD  tiZANidine (ZANAFLEX) 2 MG tablet Take 1 tablet (2 mg total) by mouth every 8 (eight) hours as needed for muscle spasms. 06/15/21   Steele Sizer, MD  Vitamin D, Ergocalciferol, (DRISDOL) 1.25 MG (50000 UNIT) CAPS capsule TAKE 1 CAPSULE BY MOUTH EVERY 7 DAYS 03/30/21   Steele Sizer, MD    Allergies Aspirin and Nsaids  Family History   Problem Relation Age of Onset   Heart disease Mother    Hypertension Mother    Hyperlipidemia Mother    SIDS Son    Breast cancer Sister 62   Hypothyroidism Sister    Hypertension Sister     Social History Social History   Tobacco Use   Smoking status: Former    Packs/day: 0.50    Years: 5.00    Pack years: 2.50    Types: Cigarettes    Start date: 11/16/1979    Quit date: 09/18/1985    Years since quitting: 36.1   Smokeless tobacco: Never  Vaping Use   Vaping Use: Never used  Substance Use Topics   Alcohol use: Yes    Alcohol/week: 0.0 standard drinks    Comment: wine/socially/rarely   Drug use: No    Review of Systems   Review of Systems  Constitutional:  Negative for chills and fever.  Respiratory:  Negative for shortness of breath.   Cardiovascular:  Negative for chest pain.  Gastrointestinal:  Positive for abdominal pain and constipation. Negative for diarrhea, nausea and vomiting.  Genitourinary:  Positive for flank pain. Negative for dysuria.  Musculoskeletal:  Positive for back pain.  Neurological:  Negative for weakness and numbness.  All other systems reviewed and are negative.  Physical Exam Updated Vital Signs BP 124/68 (BP Location: Right Arm)   Pulse 70   Temp 98.5 F (36.9 C) (Oral)   Resp 18   SpO2 100%   Physical Exam Vitals and nursing note reviewed.  Constitutional:      General: She is not in acute distress.    Appearance: Normal appearance.  HENT:     Head: Normocephalic and atraumatic.  Eyes:     General: No scleral icterus.    Conjunctiva/sclera: Conjunctivae normal.  Pulmonary:     Effort: Pulmonary effort is normal. No respiratory distress.     Breath sounds: No stridor.  Abdominal:     General: Abdomen is flat.     Palpations: Abdomen is soft.     Comments: Tenderness to palpation in the right lower quadrant, no guarding  Musculoskeletal:        General: No deformity or signs of injury.     Cervical back: Normal range of  motion.     Comments: Mild right sided SI joint tenderness, no midline C, T or L-spine tenderness  Skin:    General: Skin is dry.     Coloration: Skin is not jaundiced or pale.  Neurological:     General: No focal deficit present.     Mental Status: She is alert and oriented to person, place, and time. Mental status is at baseline.  Psychiatric:        Mood and Affect: Mood normal.        Behavior: Behavior normal.     LABS (all labs ordered are listed, but only abnormal results are displayed)  Labs Reviewed  CBC - Abnormal; Notable for the following components:  Result Value   Hemoglobin 11.0 (*)    HCT 34.5 (*)    All other components within normal limits  URINALYSIS, ROUTINE W REFLEX MICROSCOPIC - Abnormal; Notable for the following components:   Color, Urine STRAW (*)    APPearance CLEAR (*)    All other components within normal limits  COMPREHENSIVE METABOLIC PANEL   ____________________________________________  EKG  N/a ____________________________________________  RADIOLOGY Almeta Monas, personally viewed and evaluated these images (plain radiographs) as part of my medical decision making, as well as reviewing the written report by the radiologist.  ED MD interpretation: I reviewed the CT renal study which is negative for acute process    ____________________________________________   PROCEDURES  Procedure(s) performed (including Critical Care):  Procedures   ____________________________________________   INITIAL IMPRESSION / ASSESSMENT AND PLAN / ED COURSE     Patient is a 65 year old female presenting with right-sided abdominal pain.  Initially started as bilateral lower back pain and that has largely subsided but she continues to have a dull aching pain in the right lower quadrant.  She has no associated symptoms including no fevers chills urinary symptoms nausea vomiting or diarrhea.  Does have some mild constipation.  On exam she is  overall very well-appearing, she does have some mild right SI joint tenderness but no midline lumbar thoracic or C-spine tenderness.  She is tender in the right lower quadrant.  CBC and CMP are all within normal limits, she has no leukocytosis.  Her UA is negative for blood or white cells.  CT renal study was obtained there is no stone or other GU pathology.  Her appendix was well visualized and is normal.  I reviewed the CAT scan and agree that the appendix is visualized and does not appear abnormal.  Given her normal CAT scan and normal labs my suspicion for appendicitis is exceedingly low.  I did explain to her that the ideal study would be a CAT scan with contrast although CT without contrast is still very reasonable.  We discussed that if she develops new symptoms of fever inability to tolerate p.o. or significantly worsening pain that she return for repeat evaluation.  Otherwise we will treat supportively with NSAIDs.      ____________________________________________   FINAL CLINICAL IMPRESSION(S) / ED DIAGNOSES  Final diagnoses:  Flank pain  RLQ abdominal pain     ED Discharge Orders     None        Note:  This document was prepared using Dragon voice recognition software and may include unintentional dictation errors.    Rada Hay, MD 10/26/21 [phone]

## 2021-10-27 ENCOUNTER — Encounter: Payer: Self-pay | Admitting: Nurse Practitioner

## 2021-10-27 ENCOUNTER — Ambulatory Visit (INDEPENDENT_AMBULATORY_CARE_PROVIDER_SITE_OTHER): Payer: BC Managed Care – PPO | Admitting: Nurse Practitioner

## 2021-10-27 ENCOUNTER — Other Ambulatory Visit: Payer: Self-pay

## 2021-10-27 VITALS — BP 130/72 | HR 88 | Temp 97.8°F | Resp 18 | Wt 111.5 lb

## 2021-10-27 DIAGNOSIS — M5441 Lumbago with sciatica, right side: Secondary | ICD-10-CM | POA: Diagnosis not present

## 2021-10-27 MED ORDER — METHOCARBAMOL 500 MG PO TABS: 500.0000 mg | ORAL_TABLET | Freq: Two times a day (BID) | ORAL | 0 refills | Status: DC | PRN

## 2021-10-27 MED ORDER — PREDNISONE 10 MG PO TABS: ORAL_TABLET | ORAL | 0 refills | Status: DC

## 2021-10-27 MED ORDER — TRAMADOL HCL 50 MG PO TABS: 50.0000 mg | ORAL_TABLET | Freq: Three times a day (TID) | ORAL | 0 refills | Status: AC | PRN

## 2021-10-27 NOTE — Progress Notes (Signed)
BP 130/72    Pulse 88    Temp 97.8 F (36.6 C) (Oral)    Resp 18    Wt 111 lb 8 oz (50.6 kg)    SpO2 98%    BMI 20.39 kg/m    Subjective:    Patient ID: Miranda Stafford, female    DOB: June 03, 1956, 65 y.o.   MRN: [id number]  HPI: Miranda Stafford is a 65 y.o. female  Chief Complaint  Patient presents with   Back Pain    Seen in ER   Right lower back pain: She was seen in Emergency room yesterday.  She says that on Sunday she was using her new vacuum cleaner and vacuumed the whole house.  She says her lower back started bothering her so she took some tylenol and used the heating pad and had very little relief.  She decided to go to the emergency room.  She says they did a ct scan which was negative, a urine which was negative and blood work which was negative.  She denies any urinary symptoms, numbness in legs, or incontinence.  She says the pain is sharp and shoots down her right leg.  She does have tenderness in the right lower back.  Discussed likely musculoskeletal pain.  Discussed treatment plan.  Continue using heat therapy.  Take tylenol for pain, robaxin and prednisone.  Ultram for when pain is at a pain level 8-10.    Relevant past medical, surgical, family and social history reviewed and updated as indicated. Interim medical history since our last visit reviewed. Allergies and medications reviewed and updated.  Review of Systems  Constitutional: Negative for fever or weight change.  Respiratory: Negative for cough and shortness of breath.   Cardiovascular: Negative for chest pain or palpitations.  Gastrointestinal: Negative for abdominal pain, no bowel changes.  Musculoskeletal: Positive for gait problem and right lower back pain, negative for  joint swelling.  Skin: Negative for rash.  Neurological: Negative for dizziness or headache.  No other specific complaints in a complete review of systems (except as listed in HPI above).      Objective:    BP 130/72    Pulse 88    Temp  97.8 F (36.6 C) (Oral)    Resp 18    Wt 111 lb 8 oz (50.6 kg)    SpO2 98%    BMI 20.39 kg/m   Wt Readings from Last 3 Encounters:  10/27/21 111 lb 8 oz (50.6 kg)  06/15/21 109 lb (49.4 kg)  05/05/21 106 lb 11.2 oz (48.4 kg)    Physical Exam  Constitutional: Patient appears well-developed and well-nourished. No distress.  HEENT: head atraumatic, normocephalic, pupils equal and reactive to light,  neck supple Cardiovascular: Normal rate, regular rhythm and normal heart sounds.  No murmur heard. No BLE edema. Pulmonary/Chest: Effort normal and breath sounds normal. No respiratory distress. Abdominal: Soft.  There is no tenderness. Musculoskeletal: Right lower back tenderness, decreased ROM Psychiatric: Patient has a normal mood and affect. behavior is normal. Judgment and thought content normal.   Results for orders placed or performed during the hospital encounter of 10/26/21  Comprehensive metabolic panel  Result Value Ref Range   Sodium 139 135 - 145 mmol/L   Potassium 4.2 3.5 - 5.1 mmol/L   Chloride 106 98 - 111 mmol/L   CO2 26 22 - 32 mmol/L   Glucose, Bld 84 70 - 99 mg/dL   BUN 11 8 - 23 mg/dL   Creatinine,  Ser 0.94 0.44 - 1.00 mg/dL   Calcium 9.2 8.9 - 10.3 mg/dL   Total Protein 7.7 6.5 - 8.1 g/dL   Albumin 4.1 3.5 - 5.0 g/dL   AST 26 15 - 41 U/L   ALT 13 0 - 44 U/L   Alkaline Phosphatase 59 38 - 126 U/L   Total Bilirubin 0.5 0.3 - 1.2 mg/dL   GFR, Estimated >60 >60 mL/min   Anion gap 7 5 - 15  CBC  Result Value Ref Range   WBC 4.3 4.0 - 10.5 K/uL   RBC 4.22 3.87 - 5.11 MIL/uL   Hemoglobin 11.0 (L) 12.0 - 15.0 g/dL   HCT 34.5 (L) 36.0 - 46.0 %   MCV 81.8 80.0 - 100.0 fL   MCH 26.1 26.0 - 34.0 pg   MCHC 31.9 30.0 - 36.0 g/dL   RDW 13.6 11.5 - 15.5 %   Platelets 215 150 - 400 K/uL   nRBC 0.0 0.0 - 0.2 %  Urinalysis, Routine w reflex microscopic Urine, Clean Catch  Result Value Ref Range   Color, Urine STRAW (A) YELLOW   APPearance CLEAR (A) CLEAR   Specific  Gravity, Urine 1.005 1.005 - 1.030   pH 8.0 5.0 - 8.0   Glucose, UA NEGATIVE NEGATIVE mg/dL   Hgb urine dipstick NEGATIVE NEGATIVE   Bilirubin Urine NEGATIVE NEGATIVE   Ketones, ur NEGATIVE NEGATIVE mg/dL   Protein, ur NEGATIVE NEGATIVE mg/dL   Nitrite NEGATIVE NEGATIVE   Leukocytes,Ua NEGATIVE NEGATIVE      Assessment & Plan:   1. Acute right-sided low back pain with right-sided sciatica -take tylenol for pain, only use ultram for pain 8-10 on pain level -continue heat therapy -when tolerated doe back stretches like discussed - methocarbamol (ROBAXIN) 500 MG tablet; Take 1 tablet (500 mg total) by mouth 2 (two) times daily as needed for muscle spasms.  Dispense: 20 tablet; Refill: 0 - traMADol (ULTRAM) 50 MG tablet; Take 1 tablet (50 mg total) by mouth every 8 (eight) hours as needed for up to 3 days.  Dispense: 9 tablet; Refill: 0 - predniSONE (DELTASONE) 10 MG tablet; Day 1 take 6 pills, day 2 take 5 pills, day 3 take 4 pills, day 4 take 3 pills, day 5 take 2 pills, day 6 take 1 pill  Dispense: 21 tablet; Refill: 0   Follow up plan: Return if symptoms worsen or fail to improve.

## 2021-12-28 ENCOUNTER — Telehealth: Payer: Self-pay | Admitting: Family Medicine

## 2021-12-28 NOTE — Telephone Encounter (Signed)
Pt is calling to order a TB test to work in [place]. Please advise CB- [phone]

## 2021-12-29 NOTE — Telephone Encounter (Signed)
Pt is scheduled for 01/01/22

## 2021-12-31 NOTE — Progress Notes (Signed)
Name: Miranda Stafford   MRN: [id number]    DOB: [birth year]   Date:01/01/2022       Progress Note  Subjective  Chief Complaint  Follow Up  HPI  Connective tissue diease flare: she was evaluated by Dr. Dossie Der,  she states her symptoms have resolved, the aching and pains resolved, no synovitis, feeling much better, Raynaud's still happens intermittently . 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 She is in remission    B12 deficiency: she is off B12 shots, we will recheck levels, no longer having paresthesias    Hypothyroidism: Levothyroxine 88 mcg M-F skipping weekends, denies hair loss, dry skin or change in bowel movements    Leucopenia and anemia: seen by Dr. Janese Banks in 2018, had bone marrow biopsy and was given reassurance. We will recheck levels today    Nasal polyps/seasonal allergies: had sinus surgery, taking singulair and is feeling better, also use nasal spray    Asthma moderate persistent: using singulair and Breo a few times a week and seems to be controlling symptoms. No wheezing or cough at this time   Patient Active Problem List   Diagnosis Date Noted   Skin sensation disturbance 05/05/2021   Connective tissue disease ([place]) 12/19/2019   Other neutropenia ([place]) 01/10/2018   Allergic rhinitis due to allergen 08/03/2017   B12 deficiency 02/25/2017   Family history of breast cancer in sister 05/24/2016   Hypothyroidism 09/19/2015   Asthma, moderate persistent, poorly-controlled 09/19/2015   Perennial allergic rhinitis with seasonal variation 09/19/2015   Major depression in remission ([place]) 09/19/2015    Past Surgical History:  Procedure Laterality Date   ABDOMINAL HYSTERECTOMY  2005   fibroid tumor   ADENOIDECTOMY     CESAREAN SECTION     ETHMOIDECTOMY Bilateral 02/28/2020   Procedure: TOTAL ETHMOIDECTOMY;  Surgeon: Clyde Canterbury, MD;  Location:  [facility];  Service: ENT;  Laterality: Bilateral;   FRONTAL SINUS EXPLORATION Bilateral 02/28/2020   Procedure: FRONTAL SINUS EXPLORATION;  Surgeon: Clyde Canterbury, MD;  Location: [facility];  Service: ENT;  Laterality: Bilateral;   IMAGE GUIDED SINUS SURGERY N/A 02/28/2020   Procedure: IMAGE GUIDED SINUS SURGERY;  Surgeon: Clyde Canterbury, MD;  Location: [facility];  Service: ENT;  Laterality: N/A;   MAXILLARY ANTROSTOMY Bilateral 02/28/2020   Procedure: MAXILLARY ANTROSTOMY WITH TISSUE REMOVAL;  Surgeon: Clyde Canterbury, MD;  Location: [facility];  Service: ENT;  Laterality: Bilateral;   SPHENOIDECTOMY Bilateral 02/28/2020   Procedure: Coralee Pesa;  Surgeon: Clyde Canterbury, MD;  Location: [facility];  Service: ENT;  Laterality: Bilateral;   TUBAL LIGATION      Family History  Problem Relation Age of Onset   Heart disease Mother    Hypertension Mother    Hyperlipidemia Mother    SIDS Son    Breast cancer Sister 2   Hypothyroidism Sister    Hypertension Sister     Social History   Tobacco Use   Smoking status: Former    Packs/day: 0.50    Years: 5.00    Pack years: 2.50    Types: Cigarettes    Start date: 11/16/1979    Quit date: 09/18/1985    Years since quitting: 36.3   Smokeless tobacco: Never  Substance Use Topics   Alcohol use: Yes    Alcohol/week: 0.0 standard drinks    Comment:  wine/socially/rarely     Current Outpatient Medications:    albuterol (VENTOLIN HFA) 108 (90 Base) MCG/ACT inhaler, Inhale 2 puffs into the lungs every 4 (four) hours as needed for wheezing or shortness of breath., Disp: 18 g, Rfl: 2   fluticasone furoate-vilanterol (BREO ELLIPTA) 100-25 MCG/INH AEPB, Inhale 1 puff into the lungs daily., Disp: 60 each, Rfl: 1   montelukast (SINGULAIR) 10 MG tablet, Take 1 tablet (10 mg total) by mouth at bedtime., Disp: 90 tablet, Rfl: 1   SYNTHROID 88 MCG tablet, TAKE 1 TABLET BY MOUTH DAILY BEFORE BREAKFAST ON MONDAY  THROUGH FRIDAY, Disp: 90 tablet, Rfl: 1   methocarbamol (ROBAXIN) 500 MG tablet, Take 1 tablet (500 mg total) by mouth 2 (two) times daily as needed for muscle spasms. (Patient not taking: Reported on 01/01/2022), Disp: 20 tablet, Rfl: 0   predniSONE (DELTASONE) 10 MG tablet, Day 1 take 6 pills, day 2 take 5 pills, day 3 take 4 pills, day 4 take 3 pills, day 5 take 2 pills, day 6 take 1 pill (Patient not taking: Reported on 01/01/2022), Disp: 21 tablet, Rfl: 0   Vitamin D, Ergocalciferol, (DRISDOL) 1.25 MG (50000 UNIT) CAPS capsule, TAKE 1 CAPSULE BY MOUTH EVERY 7 DAYS (Patient not taking: Reported on 01/01/2022), Disp: 12 capsule, Rfl: 0  Allergies  Allergen Reactions   Aspirin Nausea And Vomiting   Nsaids     Nausea and vomiting     I personally reviewed active problem list, medication list, allergies, family history, social history, health maintenance with the patient/caregiver today.   ROS  Constitutional: Negative for fever or weight change.  Respiratory: Negative for cough and shortness of breath.   Cardiovascular: Negative for chest pain or palpitations.  Gastrointestinal: Negative for abdominal pain, no bowel changes.  Musculoskeletal: Negative for gait problem or joint swelling.  Skin: Negative for rash.  Neurological: Negative for dizziness or headache.  No other specific complaints in a complete review of systems (except as listed in HPI above).   Objective  Vitals:   01/01/22 1002  BP: 100/64  Pulse: 87  Resp: 16  Temp: 97.7 F (36.5 C)  TempSrc: Oral  SpO2: 99%  Weight: 110 lb 4.8 oz (50 kg)  Height: 5\' 2"  (1.575 m)    Body mass index is 20.17 kg/m.  Physical Exam  Constitutional: Patient appears well-developed and well-nourished.  No distress.  HEENT: head atraumatic, normocephalic, pupils equal and reactive to light,  neck supple Cardiovascular: Normal rate, regular rhythm and normal heart sounds.  No murmur heard. No BLE edema. Pulmonary/Chest: Effort  normal and breath sounds normal. No respiratory distress. Abdominal: Soft.  There is no tenderness. Psychiatric: Patient has a normal mood and affect. behavior is normal. Judgment and thought content normal.   Recent Results (from the past 2160 hour(s))  Comprehensive metabolic panel     Status: None   Collection Time: 10/25/21 11:46 PM  Result Value Ref Range   Sodium 139 135 - 145 mmol/L   Potassium 4.2 3.5 - 5.1 mmol/L   Chloride 106 98 - 111 mmol/L   CO2 26 22 - 32 mmol/L   Glucose, Bld 84 70 - 99 mg/dL    Comment: Glucose reference range applies only to samples taken after fasting for at least 8 hours.   BUN 11 8 - 23 mg/dL   Creatinine, Ser 14/11/22 0.44 - 1.00 mg/dL   Calcium 9.2 8.9 - 7.84 mg/dL   Total Protein 7.7 6.5 - 8.1 g/dL  Albumin 4.1 3.5 - 5.0 g/dL   AST 26 15 - 41 U/L   ALT 13 0 - 44 U/L   Alkaline Phosphatase 59 38 - 126 U/L   Total Bilirubin 0.5 0.3 - 1.2 mg/dL   GFR, Estimated >60 >60 mL/min    Comment: (NOTE) Calculated using the CKD-EPI Creatinine Equation (2021)    Anion gap 7 5 - 15    Comment: Performed at [facility], [address] [place], [place] [address]  CBC     Status: Abnormal   Collection Time: 10/25/21 11:46 PM  Result Value Ref Range   WBC 4.3 4.0 - 10.5 K/uL   RBC 4.22 3.87 - 5.11 MIL/uL   Hemoglobin 11.0 (L) 12.0 - 15.0 g/dL   HCT 34.5 (L) 36.0 - 46.0 %   MCV 81.8 80.0 - 100.0 fL   MCH 26.1 26.0 - 34.0 pg   MCHC 31.9 30.0 - 36.0 g/dL   RDW 13.6 11.5 - 15.5 %   Platelets 215 150 - 400 K/uL   nRBC 0.0 0.0 - 0.2 %    Comment: Performed at [facility], [address] [place], [place] [address]  Urinalysis, Routine w reflex microscopic Urine, Clean Catch     Status: Abnormal   Collection Time: 10/25/21 11:46 PM  Result Value Ref Range   Color, Urine STRAW (A) YELLOW   APPearance CLEAR (A) CLEAR   Specific Gravity, Urine 1.005 1.005 - 1.030   pH 8.0 5.0 - 8.0   Glucose, UA NEGATIVE NEGATIVE mg/dL   Hgb urine  dipstick NEGATIVE NEGATIVE   Bilirubin Urine NEGATIVE NEGATIVE   Ketones, ur NEGATIVE NEGATIVE mg/dL   Protein, ur NEGATIVE NEGATIVE mg/dL   Nitrite NEGATIVE NEGATIVE   Leukocytes,Ua NEGATIVE NEGATIVE    Comment: Performed at [facility], [address] [place], [place] [address]    PHQ2/9: Depression screen [facility] 2/9 01/01/2022 10/27/2021 06/15/2021 05/05/2021 11/21/2020  Decreased Interest 0 0 0 0 0  Down, Depressed, Hopeless 0 0 0 0 0  PHQ - 2 Score 0 0 0 0 0  Altered sleeping 0 - 0 - -  Tired, decreased energy 0 - 0 - -  Change in appetite 0 - 0 - -  Feeling bad or failure about yourself  0 - 0 - -  Trouble concentrating 0 - 0 - -  Moving slowly or fidgety/restless 0 - 0 - -  Suicidal thoughts 0 - 0 - -  PHQ-9 Score 0 - 0 - -  Difficult doing work/chores Not difficult at all - - - -  Some recent data might be hidden    phq 9 is negative   Fall Risk: Fall Risk  01/01/2022 10/27/2021 06/15/2021 05/05/2021 11/21/2020  Falls in the past year? 0 0 1 0 0  Number falls in past yr: 0 0 0 0 0  Injury with Fall? 0 0 0 0 0  Risk for fall due to : No Fall Risks - - - -  Follow up Falls prevention discussed Falls evaluation completed - Falls prevention discussed Falls evaluation completed      Functional Status Survey: Is the patient deaf or have difficulty hearing?: No Does the patient have difficulty seeing, even when wearing glasses/contacts?: No Does the patient have difficulty concentrating, remembering, or making decisions?: No Does the patient have difficulty walking or climbing stairs?: No Does the patient have difficulty dressing or bathing?: No Does the patient have difficulty doing errands alone such as visiting  a doctor's office or shopping?: No    Assessment & Plan  1. Connective tissue disease ([place])  We will not check labs since she does not have symptoms   2. Major depression in remission ([place])   3. Other neutropenia ([facility])  - CBC with  Differential/Platelet  4. Hypothyroidism, adult  - TSH  5. Moderate persistent asthma without complication  - fluticasone furoate-vilanterol (BREO ELLIPTA) 100-25 MCG/ACT AEPB; Inhale 1 puff into the lungs daily.  Dispense: 1 each; Refill: 5  6. Pre-diabetes  - Hemoglobin A1c  7. Vitamin D deficiency  -Vitamin D (25 hydroxy) - Cholecalciferol (VITAMIN D3) 50 MCG (2000 UT) capsule; Take 1 capsule (2,000 Units total) by mouth daily.  Dispense: 100 capsule; Refill: 1  8. Raynaud's phenomenon without gangrene   9. B12 deficiency  - Vitamin B12  10. Positive ANA (antinuclear antibody)   11. Lipid screening  - Lipid panel  12. Non-seasonal allergic rhinitis due to other allergic trigger  - montelukast (SINGULAIR) 10 MG tablet; Take 1 tablet (10 mg total) by mouth at bedtime.  Dispense: 90 tablet; Refill: 1  13. Screening-pulmonary TB  - QuantiFERON-TB Gold Plus

## 2022-01-01 ENCOUNTER — Encounter: Payer: Self-pay | Admitting: Family Medicine

## 2022-01-01 ENCOUNTER — Other Ambulatory Visit: Payer: Self-pay

## 2022-01-01 ENCOUNTER — Ambulatory Visit: Payer: BC Managed Care – PPO | Admitting: Family Medicine

## 2022-01-01 VITALS — BP 100/64 | HR 87 | Temp 97.7°F | Resp 16 | Ht 62.0 in | Wt 110.3 lb

## 2022-01-01 DIAGNOSIS — F325 Major depressive disorder, single episode, in full remission: Secondary | ICD-10-CM

## 2022-01-01 DIAGNOSIS — D708 Other neutropenia: Secondary | ICD-10-CM

## 2022-01-01 DIAGNOSIS — J4540 Moderate persistent asthma, uncomplicated: Secondary | ICD-10-CM

## 2022-01-01 DIAGNOSIS — M359 Systemic involvement of connective tissue, unspecified: Secondary | ICD-10-CM

## 2022-01-01 DIAGNOSIS — E039 Hypothyroidism, unspecified: Secondary | ICD-10-CM

## 2022-01-01 DIAGNOSIS — E559 Vitamin D deficiency, unspecified: Secondary | ICD-10-CM

## 2022-01-01 DIAGNOSIS — J3089 Other allergic rhinitis: Secondary | ICD-10-CM

## 2022-01-01 DIAGNOSIS — E538 Deficiency of other specified B group vitamins: Secondary | ICD-10-CM

## 2022-01-01 DIAGNOSIS — I7300 Raynaud's syndrome without gangrene: Secondary | ICD-10-CM

## 2022-01-01 DIAGNOSIS — R768 Other specified abnormal immunological findings in serum: Secondary | ICD-10-CM

## 2022-01-01 DIAGNOSIS — Z13220 Encounter for screening for lipoid disorders: Secondary | ICD-10-CM

## 2022-01-01 DIAGNOSIS — Z111 Encounter for screening for respiratory tuberculosis: Secondary | ICD-10-CM

## 2022-01-01 DIAGNOSIS — R7303 Prediabetes: Secondary | ICD-10-CM

## 2022-01-01 MED ORDER — VITAMIN D3 50 MCG (2000 UT) PO CAPS: 2000.0000 [IU] | ORAL_CAPSULE | Freq: Every day | ORAL | 1 refills | Status: AC

## 2022-01-01 MED ORDER — MONTELUKAST SODIUM 10 MG PO TABS: 10.0000 mg | ORAL_TABLET | Freq: Every day | ORAL | 1 refills | Status: DC

## 2022-01-01 MED ORDER — FLUTICASONE FUROATE-VILANTEROL 100-25 MCG/ACT IN AEPB: 1.0000 | INHALATION_SPRAY | Freq: Every day | RESPIRATORY_TRACT | 5 refills | Status: DC

## 2022-01-05 ENCOUNTER — Encounter: Payer: Self-pay | Admitting: Family Medicine

## 2022-01-06 LAB — CBC WITH DIFFERENTIAL/PLATELET
Absolute Monocytes: 160 cells/uL — ABNORMAL LOW (ref 200–950)
Basophils Absolute: 42 cells/uL (ref 0–200)
Basophils Relative: 1 %
Eosinophils Absolute: 802 cells/uL — ABNORMAL HIGH (ref 15–500)
Eosinophils Relative: 19.1 %
HCT: 33.1 % — ABNORMAL LOW (ref 35.0–45.0)
Hemoglobin: 10.4 g/dL — ABNORMAL LOW (ref 11.7–15.5)
Lymphs Abs: 1987 cells/uL (ref 850–3900)
MCH: 26.2 pg — ABNORMAL LOW (ref 27.0–33.0)
MCHC: 31.4 g/dL — ABNORMAL LOW (ref 32.0–36.0)
MCV: 83.4 fL (ref 80.0–100.0)
MPV: 11.4 fL (ref 7.5–12.5)
Monocytes Relative: 3.8 %
Neutro Abs: 1210 cells/uL — ABNORMAL LOW (ref 1500–7800)
Neutrophils Relative %: 28.8 %
Platelets: 206 10*3/uL (ref 140–400)
RBC: 3.97 10*6/uL (ref 3.80–5.10)
RDW: 14.5 % (ref 11.0–15.0)
Total Lymphocyte: 47.3 %
WBC: 4.2 10*3/uL (ref 3.8–10.8)

## 2022-01-06 LAB — HEMOGLOBIN A1C
Hgb A1c MFr Bld: 5.8 % of total Hgb — ABNORMAL HIGH (ref <5.7)
Mean Plasma Glucose: 120 mg/dL
eAG (mmol/L): 6.6 mmol/L

## 2022-01-06 LAB — QUANTIFERON-TB GOLD PLUS
Mitogen-NIL: >10 IU/mL
NIL: 0.03 IU/mL
QuantiFERON-TB Gold Plus: NEGATIVE
TB1-NIL: 0 IU/mL
TB2-NIL: 0 IU/mL

## 2022-01-06 LAB — VITAMIN D 25 HYDROXY (VIT D DEFICIENCY, FRACTURES): Vit D, 25-Hydroxy: 33 ng/mL (ref 30–100)

## 2022-01-06 LAB — LIPID PANEL
Cholesterol: 275 mg/dL — ABNORMAL HIGH (ref <200)
HDL: 76 mg/dL (ref >=50)
LDL Cholesterol (Calc): 171 mg/dL (calc) — ABNORMAL HIGH
Non-HDL Cholesterol (Calc): 199 mg/dL (calc) — ABNORMAL HIGH (ref <130)
Total CHOL/HDL Ratio: 3.6 (calc) (ref <5.0)
Triglycerides: 141 mg/dL (ref <150)

## 2022-01-06 LAB — TSH: TSH: 2.33 mIU/L (ref 0.40–4.50)

## 2022-01-06 LAB — VITAMIN B12: Vitamin B-12: 289 pg/mL (ref 200–1100)

## 2022-01-11 ENCOUNTER — Other Ambulatory Visit: Payer: Self-pay | Admitting: Family Medicine

## 2022-01-11 DIAGNOSIS — D649 Anemia, unspecified: Secondary | ICD-10-CM

## 2022-01-11 DIAGNOSIS — E785 Hyperlipidemia, unspecified: Secondary | ICD-10-CM

## 2022-01-11 DIAGNOSIS — D708 Other neutropenia: Secondary | ICD-10-CM

## 2022-01-11 MED ORDER — ROSUVASTATIN CALCIUM 10 MG PO TABS: 10.0000 mg | ORAL_TABLET | Freq: Every day | ORAL | 1 refills | Status: DC

## 2022-02-21 ENCOUNTER — Other Ambulatory Visit: Payer: Self-pay | Admitting: Family Medicine

## 2022-02-21 DIAGNOSIS — E559 Vitamin D deficiency, unspecified: Secondary | ICD-10-CM

## 2022-05-21 ENCOUNTER — Other Ambulatory Visit: Payer: Self-pay | Admitting: Family Medicine

## 2022-05-21 DIAGNOSIS — E039 Hypothyroidism, unspecified: Secondary | ICD-10-CM

## 2022-05-24 NOTE — Telephone Encounter (Signed)
Pt informed and appt scheduled for 10.2.2023

## 2022-06-10 ENCOUNTER — Other Ambulatory Visit: Payer: Self-pay | Admitting: Family Medicine

## 2022-06-10 DIAGNOSIS — M549 Dorsalgia, unspecified: Secondary | ICD-10-CM

## 2022-06-15 ENCOUNTER — Other Ambulatory Visit: Payer: Self-pay

## 2022-06-15 ENCOUNTER — Encounter: Payer: Self-pay | Admitting: Radiology

## 2022-06-15 ENCOUNTER — Emergency Department: Payer: BC Managed Care – PPO

## 2022-06-15 ENCOUNTER — Emergency Department
Admission: EM | Admit: 2022-06-15 | Discharge: 2022-06-15 | Disposition: A | Discharge status: Home / Self Care | Payer: BC Managed Care – PPO | Attending: Emergency Medicine | Admitting: Emergency Medicine

## 2022-06-15 DIAGNOSIS — R531 Weakness: Secondary | ICD-10-CM | POA: Insufficient documentation

## 2022-06-15 DIAGNOSIS — K529 Noninfective gastroenteritis and colitis, unspecified: Secondary | ICD-10-CM | POA: Diagnosis not present

## 2022-06-15 DIAGNOSIS — E860 Dehydration: Secondary | ICD-10-CM | POA: Insufficient documentation

## 2022-06-15 DIAGNOSIS — R112 Nausea with vomiting, unspecified: Secondary | ICD-10-CM | POA: Diagnosis present

## 2022-06-15 LAB — BASIC METABOLIC PANEL
Anion gap: 12 (ref 5–15)
BUN: 25 mg/dL — ABNORMAL HIGH (ref 8–23)
CO2: 20 mmol/L — ABNORMAL LOW (ref 22–32)
Calcium: 8.7 mg/dL — ABNORMAL LOW (ref 8.9–10.3)
Chloride: 103 mmol/L (ref 98–111)
Creatinine, Ser: 1.14 mg/dL — ABNORMAL HIGH (ref 0.44–1.00)
GFR, Estimated: 54 mL/min — ABNORMAL LOW (ref >60)
Glucose, Bld: 66 mg/dL — ABNORMAL LOW (ref 70–99)
Potassium: 4 mmol/L (ref 3.5–5.1)
Sodium: 135 mmol/L (ref 135–145)

## 2022-06-15 LAB — CBC
HCT: 35.6 % — ABNORMAL LOW (ref 36.0–46.0)
Hemoglobin: 11.1 g/dL — ABNORMAL LOW (ref 12.0–15.0)
MCH: 25.3 pg — ABNORMAL LOW (ref 26.0–34.0)
MCHC: 31.2 g/dL (ref 30.0–36.0)
MCV: 81.1 fL (ref 80.0–100.0)
Platelets: 172 10*3/uL (ref 150–400)
RBC: 4.39 MIL/uL (ref 3.87–5.11)
RDW: 14.6 % (ref 11.5–15.5)
WBC: 3.7 10*3/uL — ABNORMAL LOW (ref 4.0–10.5)
nRBC: 0 % (ref 0.0–0.2)

## 2022-06-15 LAB — CBG MONITORING, ED
Glucose-Capillary: 52 mg/dL — ABNORMAL LOW (ref 70–99)
Glucose-Capillary: 155 mg/dL — ABNORMAL HIGH (ref 70–99)

## 2022-06-15 MED ORDER — SODIUM CHLORIDE 0.9 % IV BOLUS
1000.0000 mL | Freq: Once | INTRAVENOUS | Status: AC
  Administered 2022-06-15: 1000 mL via INTRAVENOUS

## 2022-06-15 MED ORDER — ACETAMINOPHEN 325 MG PO TABS
650.0000 mg | ORAL_TABLET | Freq: Once | ORAL | Status: AC
  Administered 2022-06-15: 650 mg via ORAL
  Filled 2022-06-15: qty 2

## 2022-06-15 MED ORDER — IOHEXOL 300 MG/ML  SOLN
75.0000 mL | Freq: Once | INTRAMUSCULAR | Status: AC | PRN
  Administered 2022-06-15: 75 mL via INTRAVENOUS

## 2022-06-15 MED ORDER — DEXTROSE 50 % IV SOLN
INTRAVENOUS | Status: AC
  Filled 2022-06-15: qty 50

## 2022-06-15 MED ORDER — ONDANSETRON 8 MG PO TBDP: 8.0000 mg | ORAL_TABLET | Freq: Three times a day (TID) | ORAL | 0 refills | Status: DC | PRN

## 2022-06-15 NOTE — ED Triage Notes (Signed)
Pt here with dehydration and weakness. Pt states she has been in the bed since Sunday with a feer. Pt denies pain.

## 2022-06-15 NOTE — ED Provider Triage Note (Signed)
  Emergency Medicine Provider Triage Evaluation Note  Miranda Stafford , a 66 y.o.female,  was evaluated in triage.  Pt complains of weakness, dehydration, and abdominal pain.  Reports its been going on for the past 5 days and has failed to improve.  Reports tenderness in her lower abdomen.   Review of Systems  Positive: Vomiting, weakness, abdominal pain Negative: Denies fever, chest pain, diarrhea  Physical Exam   Vitals:   06/15/22 1521  BP: 95/62  Pulse: 92  Resp: 16  Temp: 98 F (36.7 C)  SpO2: 99%   Gen:   Awake, appears uncomfortable. Resp:  Normal effort  MSK:   Moves extremities without difficulty  Other:  Tenderness appreciated in the lower abdomen, particular on the left side.  Medical Decision Making  Given the patient's initial medical screening exam, the following diagnostic evaluation has been ordered. The patient will be placed in the appropriate treatment space, once one is available, to complete the evaluation and treatment. I have discussed the plan of care with the patient and I have advised the patient that an ED physician or mid-level practitioner will reevaluate their condition after the test results have been received, as the results may give them additional insight into the type of treatment they may need.    Diagnostics: Labs, UA, abdominal CT  Treatments: IV fluids.   Teodoro Spray, [place] 06/15/22 [phone]

## 2022-06-18 ENCOUNTER — Ambulatory Visit: Payer: Self-pay

## 2022-06-18 NOTE — Telephone Encounter (Signed)
Pt is calling to report that she was to the ED on 06/15/22 . Pt is reporting that her glucose today is 155. Pt reports fatigue and dizziness advise     Chief Complaint: Seen in ED 06/15/22 with dehydration.Still has fatigue and dizziness, but feels better. Symptoms: Drinking well Frequency: 06/15/22 Pertinent Negatives: Patient denies  Disposition: '[]'$ ED /'[]'$ Urgent Care (no appt availability in office) / '[x]'$ Appointment(In office/virtual)/ '[]'$  [facility]/ '[]'$ Home Care/ '[]'$ Refused Recommended Disposition /'[]'$ [facility]/ '[]'$  Follow-up with PCP Additional Notes: Return to ED for worsening of symptoms.  Reason for Disposition  [1] MODERATE dizziness (e.g., interferes with normal activities) AND [2] has been evaluated by doctor (or NP/PA) for this  Answer Assessment - Initial Assessment Questions 1. DESCRIPTION: "Describe your dizziness."     Dizzy 2. LIGHTHEADED: "Do you feel lightheaded?" (e.g., somewhat faint, woozy, weak upon standing)     Woozy 3. VERTIGO: "Do you feel like either you or the room is spinning or tilting?" (i.e. vertigo)     No 4. SEVERITY: "How bad is it?"  "Do you feel like you are going to faint?" "Can you stand and walk?"   - MILD: Feels slightly dizzy, but walking normally.   - MODERATE: Feels unsteady when walking, but not falling; interferes with normal activities (e.g., school, work).   - SEVERE: Unable to walk without falling, or requires assistance to walk without falling; feels like passing out now.      Moderate 5. ONSET:  "When did the dizziness begin?"     06/15/22 6. AGGRAVATING FACTORS: "Does anything make it worse?" (e.g., standing, change in head position)     Walking 7. HEART RATE: "Can you tell me your heart rate?" "How many beats in 15 seconds?"  (Note: not all patients can do this)       No 8. CAUSE: "What do you think is causing the dizziness?"     Was dehydrated 9. RECURRENT SYMPTOM: "Have you had dizziness before?" If Yes, ask: "When  was the last time?" "What happened that time?"     No 10. OTHER SYMPTOMS: "Do you have any other symptoms?" (e.g., fever, chest pain, vomiting, diarrhea, bleeding)       No 11. PREGNANCY: "Is there any chance you are pregnant?" "When was your last menstrual period?"       No  Protocols used: Dizziness - Lightheadedness-A-AH

## 2022-06-22 ENCOUNTER — Ambulatory Visit: Payer: BC Managed Care – PPO | Admitting: Family Medicine

## 2022-06-22 ENCOUNTER — Encounter: Payer: Self-pay | Admitting: Family Medicine

## 2022-06-22 VITALS — BP 108/66 | HR 73 | Resp 16 | Ht 62.0 in | Wt 105.0 lb

## 2022-06-22 DIAGNOSIS — I700 Atherosclerosis of aorta: Secondary | ICD-10-CM | POA: Insufficient documentation

## 2022-06-22 DIAGNOSIS — R944 Abnormal results of kidney function studies: Secondary | ICD-10-CM | POA: Diagnosis not present

## 2022-06-22 DIAGNOSIS — E860 Dehydration: Secondary | ICD-10-CM

## 2022-06-22 DIAGNOSIS — E538 Deficiency of other specified B group vitamins: Secondary | ICD-10-CM | POA: Diagnosis not present

## 2022-06-22 MED ORDER — VITAMIN B-12 1000 MCG SL SUBL: 1.0000 | SUBLINGUAL_TABLET | Freq: Every day | SUBLINGUAL | 0 refills | Status: DC

## 2022-06-22 MED ORDER — CYANOCOBALAMIN 1000 MCG/ML IJ SOLN
1000.0000 ug | Freq: Once | INTRAMUSCULAR | Status: AC
  Administered 2022-06-22: 1000 ug via INTRAMUSCULAR

## 2022-06-22 MED ORDER — ROSUVASTATIN CALCIUM 10 MG PO TABS: 10.0000 mg | ORAL_TABLET | Freq: Every day | ORAL | 1 refills | Status: DC

## 2022-06-22 NOTE — ED Provider Notes (Signed)
[facility] Provider Note   Event Date/Time   First MD Initiated Contact with Patient 06/15/22 1956     (approximate) History  Weakness and Emesis  HPI Miranda Stafford is a 66 y.o. female who presents for dehydration, weakness, nausea, and vomiting has been present over the last 3 days.  Patient states that she has been p.o. intolerant over the past 24 hours.  Patient denies any significant abdominal pain.  Patient denies any recent travel, sick contacts, or food out of the ordinary.  Patient denies any diarrhea. ROS: Patient currently denies any vision changes, tinnitus, difficulty speaking, facial droop, sore throat, chest pain, shortness of breath, abdominal pain, diarrhea, dysuria, or weakness/numbness/paresthesias in any extremity   Physical Exam  Triage Vital Signs: ED Triage Vitals  Enc Vitals Group     BP 06/15/22 1521 95/62     Pulse Rate 06/15/22 1521 92     Resp 06/15/22 1521 16     Temp 06/15/22 1521 98 F (36.7 C)     Temp Source 06/15/22 1521 Oral     SpO2 06/15/22 1521 99 %     Weight 06/15/22 1520 110 lb 3.7 oz (50 kg)     Height 06/15/22 1520 '5\' 2"'$  (1.575 m)     Head Circumference --      Peak Flow --      Pain Score 06/15/22 1520 0     Pain Loc --      Pain Edu? --      Excl. in [place]? --    Most recent vital signs: Vitals:   06/15/22 2130 06/15/22 2330  BP: 92/64 98/60  Pulse: (!) 101 92  Resp:    Temp:    SpO2: 98% 98%   General: Awake, oriented x4. CV:  Good peripheral perfusion.  Resp:  Normal effort.  Abd:  No distention.  Other:  Elderly African-American female laying in bed in no acute distress ED Results / Procedures / Treatments  Labs (all labs ordered are listed, but only abnormal results are displayed) Labs Reviewed  BASIC METABOLIC PANEL - Abnormal; Notable for the following components:      Result Value   CO2 20 (*)    Glucose, Bld 66 (*)    BUN 25 (*)    Creatinine, Ser 1.14 (*)    Calcium 8.7 (*)    GFR,  Estimated 54 (*)    All other components within normal limits  CBC - Abnormal; Notable for the following components:   WBC 3.7 (*)    Hemoglobin 11.1 (*)    HCT 35.6 (*)    MCH 25.3 (*)    All other components within normal limits  CBG MONITORING, ED - Abnormal; Notable for the following components:   Glucose-Capillary 52 (*)    All other components within normal limits  CBG MONITORING, ED - Abnormal; Notable for the following components:   Glucose-Capillary 155 (*)    All other components within normal limits   EKG ED ECG REPORT I, Naaman Plummer, the attending physician, personally viewed and interpreted this ECG. Date: 06/22/2022 EKG Time: 1528 Rate: 93 Rhythm: normal sinus rhythm QRS Axis: normal Intervals: normal ST/T Wave abnormalities: normal Narrative Interpretation: no evidence of acute ischemia RADIOLOGY ED MD interpretation: CT of the abdomen and pelvis with IV contrast interpreted by me and shows no acute evidence of intra-abdominal pathology -Agree with radiology assessment Official radiology report(s): No results found. PROCEDURES: Critical Care performed: No .1-3 Lead  EKG Interpretation  Performed by: Naaman Plummer, MD Authorized by: Naaman Plummer, MD     Interpretation: normal     ECG rate:  91   ECG rate assessment: normal     Rhythm: sinus rhythm     Ectopy: none     Conduction: normal    MEDICATIONS ORDERED IN ED: Medications  sodium chloride 0.9 % bolus 1,000 mL (0 mLs Intravenous Stopped 06/15/22 1930)  iohexol (OMNIPAQUE) 300 MG/ML solution 75 mL (75 mLs Intravenous Contrast Given 06/15/22 1750)  dextrose 50 % solution (  Given 06/15/22 1901)  acetaminophen (TYLENOL) tablet 650 mg (650 mg Oral Given 06/15/22 2137)   IMPRESSION / MDM / ASSESSMENT AND PLAN / ED COURSE  I reviewed the triage vital signs and the nursing notes.                             The patient is on the cardiac monitor to evaluate for evidence of arrhythmia and/or significant  heart rate changes. Patient's presentation is most consistent with acute presentation with potential threat to life or bodily function. Patient presents for acute nausea/vomiting The cause of the patients symptoms is not clear, but the patient is overall well appearing and is suspected to have a transient course of illness.  Given History and Exam there does not appear to be an emergent cause of the symptoms such as small bowel obstruction, coronary syndrome, bowel ischemia, DKA, pancreatitis, appendicitis, other acute abdomen or other emergent problem.  Reassessment: After treatment, the patient is feeling much better, tolerating PO fluids, and shows no signs of severe dehydration.   Disposition: Discharge home with prompt primary care physician follow up in the next 48 hours. Strict return precautions discussed.   FINAL CLINICAL IMPRESSION(S) / ED DIAGNOSES   Final diagnoses:  Dehydration  Nausea and vomiting, unspecified vomiting type  Gastroenteritis   Rx / DC Orders   ED Discharge Orders          Ordered    ondansetron (ZOFRAN-ODT) 8 MG disintegrating tablet  Every 8 hours PRN,   Status:  Discontinued        06/15/22 2303           Note:  This document was prepared using Dragon voice recognition software and may include unintentional dictation errors.   Naaman Plummer, MD 06/22/22 1320

## 2022-06-22 NOTE — Progress Notes (Signed)
Name: Miranda Stafford   MRN: [id number]    DOB: Apr 17, 1956   Date:06/22/2022       Progress Note  Subjective  Chief Complaint  ED Follow Up  HPI  Dehydration: she was at [facility] for a workshop and the Sunday before she returned ( 06/13/2020 ) she had breakfast - one pancake and one bite of a sausage  and went to the hotel because she did not feel well . She had a mild cough and slept the rest of the day. She woke up the following morning and did not have breakfast,  slept during her flights . She was with her sister and states does not recall her flight home. Did not eat anything. She vomited multiple times on 06/14/2022 felt weak, tired, nauseated but no diarrhea. She went to [facility] the following day, she was hypoglycemic , dehydrated, had CT abdomen and pelvis that was negative for acute problems, but showed aorta atherosclerosis.   She no longer feels nauseated, vomiting resolved, no abdominal pain, but appetite is still poor and feels tired and not her normal self. She has been tearful . She did not get checked for COVID  She was not checked for COVID  Denies sob, cough improved    Patient Active Problem List   Diagnosis Date Noted   Atherosclerosis of aorta ([place]) 06/22/2022   Skin sensation disturbance 05/05/2021   Connective tissue disease ([place]) 12/19/2019   Other neutropenia ([place]) 01/10/2018   Allergic rhinitis due to allergen 08/03/2017   B12 deficiency 02/25/2017   Family history of breast cancer in sister 05/24/2016   Hypothyroidism 09/19/2015   Asthma, moderate persistent, poorly-controlled 09/19/2015   Perennial allergic rhinitis with seasonal variation 09/19/2015   Major depression in remission ([place]) 09/19/2015    Past Surgical History:  Procedure Laterality Date   ABDOMINAL HYSTERECTOMY  2005   fibroid tumor   ADENOIDECTOMY     CESAREAN SECTION     ETHMOIDECTOMY Bilateral 02/28/2020   Procedure: TOTAL ETHMOIDECTOMY;  Surgeon: Clyde Canterbury, MD;  Location: [place];  Service: ENT;  Laterality: Bilateral;   FRONTAL SINUS EXPLORATION Bilateral 02/28/2020   Procedure: FRONTAL SINUS EXPLORATION;  Surgeon: Clyde Canterbury, MD;  Location: [place];  Service: ENT;  Laterality: Bilateral;   IMAGE GUIDED SINUS SURGERY N/A 02/28/2020   Procedure: IMAGE GUIDED SINUS SURGERY;  Surgeon: Clyde Canterbury, MD;  Location: [place];  Service: ENT;  Laterality: N/A;   MAXILLARY ANTROSTOMY Bilateral 02/28/2020   Procedure: MAXILLARY ANTROSTOMY WITH TISSUE REMOVAL;  Surgeon: Clyde Canterbury, MD;  Location: [place];  Service: ENT;  Laterality: Bilateral;   SPHENOIDECTOMY Bilateral 02/28/2020   Procedure: Coralee Pesa;  Surgeon: Clyde Canterbury, MD;  Location: [place];  Service: ENT;  Laterality: Bilateral;   TUBAL LIGATION      Family History  Problem Relation Age of Onset   Heart disease Mother    Hypertension Mother    Hyperlipidemia Mother    SIDS Son    Breast cancer Sister 56   Hypothyroidism Sister    Hypertension Sister     Social History   Tobacco Use   Smoking status: Former    Packs/day: 0.50    Years: 5.00    Total pack years: 2.50    Types: Cigarettes    Start date: 11/16/1979    Quit date: 09/18/1985    Years since quitting: 36.7   Smokeless tobacco: Never  Substance Use Topics   Alcohol use: Yes  Alcohol/week: 0.0 standard drinks of alcohol    Comment: wine/socially/rarely     Current Outpatient Medications:    albuterol (VENTOLIN HFA) 108 (90 Base) MCG/ACT inhaler, Inhale 2 puffs into the lungs every 4 (four) hours as needed for wheezing or shortness of breath., Disp: 18 g, Rfl: 2   Cholecalciferol (VITAMIN D3) 50 MCG (2000 UT) capsule, Take 1 capsule (2,000 Units total) by mouth daily., Disp: 100 capsule, Rfl: 1   Cyanocobalamin (VITAMIN B-12) 1000 MCG SUBL, Place 1 tablet (1,000 mcg total) under the tongue daily at 12 noon., Disp: 30 tablet, Rfl: 0   fluticasone furoate-vilanterol (BREO  ELLIPTA) 100-25 MCG/ACT AEPB, Inhale 1 puff into the lungs daily., Disp: 1 each, Rfl: 5   montelukast (SINGULAIR) 10 MG tablet, Take 1 tablet (10 mg total) by mouth at bedtime., Disp: 90 tablet, Rfl: 1   SYNTHROID 88 MCG tablet, TAKE 1 TABLET BY MOUTH DAILY BEFORE BREAKFAST ON MONDAY THROUGH FRIDAY, Disp: 90 tablet, Rfl: 0   rosuvastatin (CRESTOR) 10 MG tablet, Take 1 tablet (10 mg total) by mouth daily., Disp: 90 tablet, Rfl: 1  Current Facility-Administered Medications:    cyanocobalamin (VITAMIN B12) injection 1,000 mcg, 1,000 mcg, Intramuscular, Once, Johnathon Mittal, Drue Stager, MD  Allergies  Allergen Reactions   Aspirin Nausea And Vomiting   Nsaids     Nausea and vomiting     I personally reviewed active problem list, medication list, allergies, family history, social history, health maintenance with the patient/caregiver today.   ROS  Ten systems reviewed and is negative except as mentioned in HPI   Objective  Vitals:   06/22/22 1014  BP: 108/66  Pulse: 73  Resp: 16  Weight: 105 lb (47.6 kg)  Height: '5\' 2"'$  (1.575 m)    Body mass index is 19.2 kg/m.  Physical Exam  Constitutional: Patient appears well-developed and well-nourished.  No distress.  HEENT: head atraumatic, normocephalic, pupils equal and reactive to light, neck supple, throat within normal limits Cardiovascular: Normal rate, regular rhythm and normal heart sounds.  No murmur heard. No BLE edema. Pulmonary/Chest: Effort normal and breath sounds normal. No respiratory distress. Abdominal: Soft.  There is no tenderness. Psychiatric: Patient has a normal mood and affect. behavior is normal. Judgment and thought content normal.   Recent Results (from the past 2160 hour(s))  Basic metabolic panel     Status: Abnormal   Collection Time: 06/15/22  3:22 PM  Result Value Ref Range   Sodium 135 135 - 145 mmol/L   Potassium 4.0 3.5 - 5.1 mmol/L   Chloride 103 98 - 111 mmol/L   CO2 20 (L) 22 - 32 mmol/L   Glucose, Bld  66 (L) 70 - 99 mg/dL    Comment: Glucose reference range applies only to samples taken after fasting for at least 8 hours.   BUN 25 (H) 8 - 23 mg/dL   Creatinine, Ser 1.14 (H) 0.44 - 1.00 mg/dL   Calcium 8.7 (L) 8.9 - 10.3 mg/dL   GFR, Estimated 54 (L) >60 mL/min    Comment: (NOTE) Calculated using the CKD-EPI Creatinine Equation (2021)    Anion gap 12 5 - 15    Comment: Performed at [facility], [address] [place], [place] [address]  CBC     Status: Abnormal   Collection Time: 06/15/22  3:22 PM  Result Value Ref Range   WBC 3.7 (L) 4.0 - 10.5 K/uL   RBC 4.39 3.87 - 5.11 MIL/uL   Hemoglobin 11.1 (L) 12.0 -  15.0 g/dL   HCT 35.6 (L) 36.0 - 46.0 %   MCV 81.1 80.0 - 100.0 fL   MCH 25.3 (L) 26.0 - 34.0 pg   MCHC 31.2 30.0 - 36.0 g/dL   RDW 14.6 11.5 - 15.5 %   Platelets 172 150 - 400 K/uL   nRBC 0.0 0.0 - 0.2 %    Comment: Performed at [facility], [address] [place], [place] [address]  CBG monitoring, ED     Status: Abnormal   Collection Time: 06/15/22  6:55 PM  Result Value Ref Range   Glucose-Capillary 52 (L) 70 - 99 mg/dL    Comment: Glucose reference range applies only to samples taken after fasting for at least 8 hours.  CBG monitoring, ED     Status: Abnormal   Collection Time: 06/15/22  8:33 PM  Result Value Ref Range   Glucose-Capillary 155 (H) 70 - 99 mg/dL    Comment: Glucose reference range applies only to samples taken after fasting for at least 8 hours.    PHQ2/9:    06/22/2022   10:13 AM 01/01/2022    9:55 AM 10/27/2021    1:28 PM 06/15/2021    3:27 PM 05/05/2021   11:37 AM  Depression screen PHQ 2/9  Decreased Interest 0 0 0 0 0  Down, Depressed, Hopeless 0 0 0 0 0  PHQ - 2 Score 0 0 0 0 0  Altered sleeping 0 0  0   Tired, decreased energy 3 0  0   Change in appetite 3 0  0   Feeling bad or failure about yourself  0 0  0   Trouble concentrating 0 0  0   Moving slowly or fidgety/restless 0 0  0   Suicidal thoughts 0 0  0    PHQ-9 Score 6 0  0   Difficult doing work/chores  Not difficult at all       phq 9 is positive   Fall Risk:    06/22/2022   10:13 AM 01/01/2022    9:54 AM 10/27/2021    1:28 PM 06/15/2021    3:27 PM 05/05/2021   11:37 AM  Fall Risk   Falls in the past year? 0 0 0 1 0  Number falls in past yr: 0 0 0 0 0  Injury with Fall? 0 0 0 0 0  Risk for fall due to : No Fall Risks No Fall Risks     Follow up Falls prevention discussed Falls prevention discussed Falls evaluation completed  Falls prevention discussed     Functional Status Survey: Is the patient deaf or have difficulty hearing?: No Does the patient have difficulty seeing, even when wearing glasses/contacts?: No Does the patient have difficulty concentrating, remembering, or making decisions?: No Does the patient have difficulty walking or climbing stairs?: No Does the patient have difficulty dressing or bathing?: No Does the patient have difficulty doing errands alone such as visiting a doctor's office or shopping?: No    Assessment & Plan  1. Atherosclerosis of aorta [facility])  She is willing to try crestor, she states it was not at the pharmacy last time   2. Dehydration  - COMPLETE METABOLIC PANEL WITH GFR  She may have had COVID due to her symptoms of fatigue, feeling depressed, dry cough, vomiting, she did not get tested  3. Decreased GFR  - COMPLETE METABOLIC PANEL WITH GFR  4. Low serum vitamin B12  - Cyanocobalamin (VITAMIN B-12) 1000 MCG  SUBL; Place 1 tablet (1,000 mcg total) under the tongue daily at 12 noon.  Dispense: 30 tablet; Refill: 0 - cyanocobalamin (VITAMIN B12) injection 1,000 mcg

## 2022-06-23 LAB — COMPLETE METABOLIC PANEL WITH GFR
AG Ratio: 1.4 (calc) (ref 1.0–2.5)
ALT: 14 U/L (ref 6–29)
AST: 23 U/L (ref 10–35)
Albumin: 4.2 g/dL (ref 3.6–5.1)
Alkaline phosphatase (APISO): 51 U/L (ref 37–153)
BUN: 9 mg/dL (ref 7–25)
CO2: 27 mmol/L (ref 20–32)
Calcium: 9.2 mg/dL (ref 8.6–10.4)
Chloride: 104 mmol/L (ref 98–110)
Creat: 0.9 mg/dL (ref 0.50–1.05)
Globulin: 3.1 g/dL (calc) (ref 1.9–3.7)
Glucose, Bld: 93 mg/dL (ref 65–99)
Potassium: 4.1 mmol/L (ref 3.5–5.3)
Sodium: 139 mmol/L (ref 135–146)
Total Bilirubin: 0.3 mg/dL (ref 0.2–1.2)
Total Protein: 7.3 g/dL (ref 6.1–8.1)
eGFR: 71 mL/min/{1.73_m2} (ref >=60)

## 2022-08-13 NOTE — Progress Notes (Unsigned)
Name: Miranda Stafford   MRN: [id number]    DOB: Jan 05, 1956   Date:08/13/2022       Progress Note  Subjective  Chief Complaint  Medication Refill  HPI  Connective tissue diease flare: she was evaluated by Dr. Dossie Der,  she states her symptoms have resolved, the aching and pains resolved, no synovitis, feeling much better, Raynaud's still happens intermittently . 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 She is in remission    B12 deficiency: she is off B12 shots, we will recheck levels, no longer having paresthesias    Hypothyroidism: Levothyroxine 88 mcg M-F skipping weekends, denies hair loss, dry skin or change in bowel movements    Leucopenia and anemia: seen by Dr. Janese Banks in 2018, had bone marrow biopsy and was given reassurance. We will recheck levels today    Nasal polyps/seasonal allergies: had sinus surgery, taking singulair and is feeling better, also use nasal spray    Asthma moderate persistent: using singulair and Breo a few times a week and seems to be controlling symptoms. No wheezing or cough at this time   Patient Active Problem List   Diagnosis Date Noted   Atherosclerosis of aorta ([place]) 06/22/2022   Skin sensation disturbance 05/05/2021   Connective tissue disease ([place]) 12/19/2019   Other neutropenia ([place]) 01/10/2018   Allergic rhinitis due to allergen 08/03/2017   B12 deficiency 02/25/2017   Family history of breast cancer in sister 05/24/2016   Hypothyroidism 09/19/2015   Asthma, moderate persistent, poorly-controlled 09/19/2015   Perennial allergic rhinitis with seasonal variation 09/19/2015   Major depression in remission ([place]) 09/19/2015    Past Surgical History:  Procedure Laterality Date   ABDOMINAL HYSTERECTOMY  2005   fibroid tumor   ADENOIDECTOMY     CESAREAN SECTION     ETHMOIDECTOMY Bilateral 02/28/2020   Procedure: TOTAL  ETHMOIDECTOMY;  Surgeon: Clyde Canterbury, MD;  Location: [place];  Service: ENT;  Laterality: Bilateral;   FRONTAL SINUS EXPLORATION Bilateral 02/28/2020   Procedure: FRONTAL SINUS EXPLORATION;  Surgeon: Clyde Canterbury, MD;  Location: [place];  Service: ENT;  Laterality: Bilateral;   IMAGE GUIDED SINUS SURGERY N/A 02/28/2020   Procedure: IMAGE GUIDED SINUS SURGERY;  Surgeon: Clyde Canterbury, MD;  Location: [place];  Service: ENT;  Laterality: N/A;   MAXILLARY ANTROSTOMY Bilateral 02/28/2020   Procedure: MAXILLARY ANTROSTOMY WITH TISSUE REMOVAL;  Surgeon: Clyde Canterbury, MD;  Location: [place];  Service: ENT;  Laterality: Bilateral;   SPHENOIDECTOMY Bilateral 02/28/2020   Procedure: Coralee Pesa;  Surgeon: Clyde Canterbury, MD;  Location: [place];  Service: ENT;  Laterality: Bilateral;   TUBAL LIGATION      Family History  Problem Relation Age of Onset   Heart disease Mother    Hypertension Mother    Hyperlipidemia Mother    SIDS Son    Breast cancer Sister 48   Hypothyroidism Sister    Hypertension Sister     Social History   Tobacco Use   Smoking status: Former    Packs/day: 0.50    Years: 5.00    Total pack years: 2.50    Types: Cigarettes    Start date: 11/16/1979    Quit date: 09/18/1985    Years since quitting: 36.9   Smokeless tobacco: Never  Substance Use Topics   Alcohol use: Yes  Alcohol/week: 0.0 standard drinks of alcohol    Comment: wine/socially/rarely     Current Outpatient Medications:    albuterol (VENTOLIN HFA) 108 (90 Base) MCG/ACT inhaler, Inhale 2 puffs into the lungs every 4 (four) hours as needed for wheezing or shortness of breath., Disp: 18 g, Rfl: 2   Cholecalciferol (VITAMIN D3) 50 MCG (2000 UT) capsule, Take 1 capsule (2,000 Units total) by mouth daily., Disp: 100 capsule, Rfl: 1   Cyanocobalamin (VITAMIN B-12) 1000 MCG SUBL, Place 1 tablet (1,000 mcg total) under the tongue daily at 12 noon.,  Disp: 30 tablet, Rfl: 0   fluticasone furoate-vilanterol (BREO ELLIPTA) 100-25 MCG/ACT AEPB, Inhale 1 puff into the lungs daily., Disp: 1 each, Rfl: 5   montelukast (SINGULAIR) 10 MG tablet, Take 1 tablet (10 mg total) by mouth at bedtime., Disp: 90 tablet, Rfl: 1   rosuvastatin (CRESTOR) 10 MG tablet, Take 1 tablet (10 mg total) by mouth daily., Disp: 90 tablet, Rfl: 1   SYNTHROID 88 MCG tablet, TAKE 1 TABLET BY MOUTH DAILY BEFORE BREAKFAST ON MONDAY THROUGH FRIDAY, Disp: 90 tablet, Rfl: 0  Allergies  Allergen Reactions   Aspirin Nausea And Vomiting   Nsaids     Nausea and vomiting     I personally reviewed active problem list, medication list, allergies, family history, social history, health maintenance with the patient/caregiver today.   ROS  ***  Objective  There were no vitals filed for this visit.  There is no height or weight on file to calculate BMI.  Physical Exam ***  Recent Results (from the past 2160 hour(s))  Basic metabolic panel     Status: Abnormal   Collection Time: 06/15/22  3:22 PM  Result Value Ref Range   Sodium 135 135 - 145 mmol/L   Potassium 4.0 3.5 - 5.1 mmol/L   Chloride 103 98 - 111 mmol/L   CO2 20 (L) 22 - 32 mmol/L   Glucose, Bld 66 (L) 70 - 99 mg/dL    Comment: Glucose reference range applies only to samples taken after fasting for at least 8 hours.   BUN 25 (H) 8 - 23 mg/dL   Creatinine, Ser 1.14 (H) 0.44 - 1.00 mg/dL   Calcium 8.7 (L) 8.9 - 10.3 mg/dL   GFR, Estimated 54 (L) >60 mL/min    Comment: (NOTE) Calculated using the CKD-EPI Creatinine Equation (2021)    Anion gap 12 5 - 15    Comment: Performed at [facility], [address] [place], [place] [address]  CBC     Status: Abnormal   Collection Time: 06/15/22  3:22 PM  Result Value Ref Range   WBC 3.7 (L) 4.0 - 10.5 K/uL   RBC 4.39 3.87 - 5.11 MIL/uL   Hemoglobin 11.1 (L) 12.0 - 15.0 g/dL   HCT 35.6 (L) 36.0 - 46.0 %   MCV 81.1 80.0 - 100.0 fL   MCH 25.3 (L) 26.0  - 34.0 pg   MCHC 31.2 30.0 - 36.0 g/dL   RDW 14.6 11.5 - 15.5 %   Platelets 172 150 - 400 K/uL   nRBC 0.0 0.0 - 0.2 %    Comment: Performed at [facility], [address] [place], [place] [address]  CBG monitoring, ED     Status: Abnormal   Collection Time: 06/15/22  6:55 PM  Result Value Ref Range   Glucose-Capillary 52 (L) 70 - 99 mg/dL    Comment: Glucose reference range applies only to samples taken after fasting  for at least 8 hours.  CBG monitoring, ED     Status: Abnormal   Collection Time: 06/15/22  8:33 PM  Result Value Ref Range   Glucose-Capillary 155 (H) 70 - 99 mg/dL    Comment: Glucose reference range applies only to samples taken after fasting for at least 8 hours.  COMPLETE METABOLIC PANEL WITH GFR     Status: None   Collection Time: 06/22/22 11:04 AM  Result Value Ref Range   Glucose, Bld 93 65 - 99 mg/dL    Comment: .            Fasting reference interval .    BUN 9 7 - 25 mg/dL   Creat 0.90 0.50 - 1.05 mg/dL   eGFR 71 > OR = 60 mL/min/1.52m   BUN/Creatinine Ratio SEE NOTE: 6 - 22 (calc)    Comment:    Not Reported: BUN and Creatinine are within    reference range. .    Sodium 139 135 - 146 mmol/L   Potassium 4.1 3.5 - 5.3 mmol/L   Chloride 104 98 - 110 mmol/L   CO2 27 20 - 32 mmol/L   Calcium 9.2 8.6 - 10.4 mg/dL   Total Protein 7.3 6.1 - 8.1 g/dL   Albumin 4.2 3.6 - 5.1 g/dL   Globulin 3.1 1.9 - 3.7 g/dL (calc)   AG Ratio 1.4 1.0 - 2.5 (calc)   Total Bilirubin 0.3 0.2 - 1.2 mg/dL   Alkaline phosphatase (APISO) 51 37 - 153 U/L   AST 23 10 - 35 U/L   ALT 14 6 - 29 U/L    PHQ2/9:    06/22/2022   10:13 AM 01/01/2022    9:55 AM 10/27/2021    1:28 PM 06/15/2021    3:27 PM 05/05/2021   11:37 AM  Depression screen PHQ 2/9  Decreased Interest 0 0 0 0 0  Down, Depressed, Hopeless 0 0 0 0 0  PHQ - 2 Score 0 0 0 0 0  Altered sleeping 0 0  0   Tired, decreased energy 3 0  0   Change in appetite 3 0  0   Feeling bad or failure about yourself   0 0  0   Trouble concentrating 0 0  0   Moving slowly or fidgety/restless 0 0  0   Suicidal thoughts 0 0  0   PHQ-9 Score 6 0  0   Difficult doing work/chores  Not difficult at all       phq 9 is {gen pos [id number]}  Fall Risk:    06/22/2022   10:13 AM 01/01/2022    9:54 AM 10/27/2021    1:28 PM 06/15/2021    3:27 PM 05/05/2021   11:37 AM  Fall Risk   Falls in the past year? 0 0 0 1 0  Number falls in past yr: 0 0 0 0 0  Injury with Fall? 0 0 0 0 0  Risk for fall due to : No Fall Risks No Fall Risks     Follow up Falls prevention discussed Falls prevention discussed Falls evaluation completed  Falls prevention discussed      Functional Status Survey:      Assessment & Plan  *** There are no diagnoses linked to this encounter.

## 2022-08-16 ENCOUNTER — Ambulatory Visit (INDEPENDENT_AMBULATORY_CARE_PROVIDER_SITE_OTHER): Payer: BC Managed Care – PPO | Admitting: Family Medicine

## 2022-08-16 ENCOUNTER — Encounter: Payer: Self-pay | Admitting: Family Medicine

## 2022-08-16 VITALS — BP 118/70 | HR 82 | Resp 16 | Ht 62.0 in | Wt 109.0 lb

## 2022-08-16 DIAGNOSIS — E538 Deficiency of other specified B group vitamins: Secondary | ICD-10-CM

## 2022-08-16 DIAGNOSIS — E039 Hypothyroidism, unspecified: Secondary | ICD-10-CM

## 2022-08-16 DIAGNOSIS — I700 Atherosclerosis of aorta: Secondary | ICD-10-CM

## 2022-08-16 DIAGNOSIS — J3089 Other allergic rhinitis: Secondary | ICD-10-CM

## 2022-08-16 DIAGNOSIS — M359 Systemic involvement of connective tissue, unspecified: Secondary | ICD-10-CM

## 2022-08-16 DIAGNOSIS — E559 Vitamin D deficiency, unspecified: Secondary | ICD-10-CM

## 2022-08-16 DIAGNOSIS — Z1231 Encounter for screening mammogram for malignant neoplasm of breast: Secondary | ICD-10-CM

## 2022-08-16 DIAGNOSIS — D708 Other neutropenia: Secondary | ICD-10-CM

## 2022-08-16 DIAGNOSIS — J4540 Moderate persistent asthma, uncomplicated: Secondary | ICD-10-CM | POA: Diagnosis not present

## 2022-08-16 DIAGNOSIS — Z79899 Other long term (current) drug therapy: Secondary | ICD-10-CM

## 2022-08-16 DIAGNOSIS — R739 Hyperglycemia, unspecified: Secondary | ICD-10-CM

## 2022-08-16 DIAGNOSIS — E785 Hyperlipidemia, unspecified: Secondary | ICD-10-CM

## 2022-08-16 DIAGNOSIS — Z23 Encounter for immunization: Secondary | ICD-10-CM

## 2022-08-16 DIAGNOSIS — J4520 Mild intermittent asthma, uncomplicated: Secondary | ICD-10-CM

## 2022-08-16 MED ORDER — MONTELUKAST SODIUM 10 MG PO TABS: 10.0000 mg | ORAL_TABLET | Freq: Every day | ORAL | 1 refills | Status: DC

## 2022-08-16 MED ORDER — ALBUTEROL SULFATE HFA 108 (90 BASE) MCG/ACT IN AERS: 2.0000 | INHALATION_SPRAY | RESPIRATORY_TRACT | 0 refills | Status: DC | PRN

## 2022-08-16 MED ORDER — CYANOCOBALAMIN 1000 MCG/ML IJ SOLN
1000.0000 ug | Freq: Once | INTRAMUSCULAR | Status: AC
  Administered 2022-08-16: 1000 ug via INTRAMUSCULAR

## 2022-08-16 MED ORDER — ATORVASTATIN CALCIUM 20 MG PO TABS: 20.0000 mg | ORAL_TABLET | Freq: Every day | ORAL | 1 refills | Status: DC

## 2022-08-16 MED ORDER — FLUTICASONE FUROATE-VILANTEROL 100-25 MCG/ACT IN AEPB: 1.0000 | INHALATION_SPRAY | Freq: Every day | RESPIRATORY_TRACT | 5 refills | Status: DC

## 2022-08-16 MED ORDER — RYALTRIS 665-25 MCG/ACT NA SUSP: 2.0000 | Freq: Every day | NASAL | 5 refills | Status: DC

## 2022-11-18 ENCOUNTER — Ambulatory Visit
Admission: RE | Admit: 2022-11-18 | Discharge: 2022-11-18 | Disposition: A | Discharge status: Home / Self Care | Payer: BC Managed Care – PPO | Source: Ambulatory Visit | Attending: Family Medicine | Admitting: Family Medicine

## 2022-11-18 DIAGNOSIS — Z1231 Encounter for screening mammogram for malignant neoplasm of breast: Secondary | ICD-10-CM | POA: Insufficient documentation

## 2022-11-23 ENCOUNTER — Other Ambulatory Visit: Payer: Self-pay | Admitting: Family Medicine

## 2022-11-23 DIAGNOSIS — E039 Hypothyroidism, unspecified: Secondary | ICD-10-CM

## 2022-11-23 NOTE — Telephone Encounter (Signed)
Pt is coming for Blood work 11-24-2022 AT 3 and  has made a 6 month fillowup

## 2022-11-24 NOTE — Telephone Encounter (Signed)
Called and lvm inquiring if patient is completely out. I let her know to inform us when she arrives for labs.

## 2022-11-25 ENCOUNTER — Other Ambulatory Visit: Payer: Self-pay | Admitting: Family Medicine

## 2022-11-25 DIAGNOSIS — E039 Hypothyroidism, unspecified: Secondary | ICD-10-CM

## 2022-11-25 LAB — CBC WITH DIFFERENTIAL/PLATELET
Absolute Monocytes: 228 cells/uL (ref 200–950)
Basophils Absolute: 20 cells/uL (ref 0–200)
Basophils Relative: 0.5 %
Eosinophils Absolute: 360 cells/uL (ref 15–500)
Eosinophils Relative: 9 %
HCT: 32.7 % — ABNORMAL LOW (ref 35.0–45.0)
Hemoglobin: 10.4 g/dL — ABNORMAL LOW (ref 11.7–15.5)
Lymphs Abs: 1944 cells/uL (ref 850–3900)
MCH: 25.7 pg — ABNORMAL LOW (ref 27.0–33.0)
MCHC: 31.8 g/dL — ABNORMAL LOW (ref 32.0–36.0)
MCV: 80.7 fL (ref 80.0–100.0)
MPV: 11.5 fL (ref 7.5–12.5)
Monocytes Relative: 5.7 %
Neutro Abs: 1448 cells/uL — ABNORMAL LOW (ref 1500–7800)
Neutrophils Relative %: 36.2 %
Platelets: 221 10*3/uL (ref 140–400)
RBC: 4.05 10*6/uL (ref 3.80–5.10)
RDW: 13 % (ref 11.0–15.0)
Total Lymphocyte: 48.6 %
WBC: 4 10*3/uL (ref 3.8–10.8)

## 2022-11-25 LAB — COMPLETE METABOLIC PANEL WITHOUT GFR
AG Ratio: 1.4 (calc) (ref 1.0–2.5)
ALT: 11 U/L (ref 6–29)
AST: 22 U/L (ref 10–35)
Albumin: 4.3 g/dL (ref 3.6–5.1)
Alkaline phosphatase (APISO): 57 U/L (ref 37–153)
BUN: 13 mg/dL (ref 7–25)
CO2: 26 mmol/L (ref 20–32)
Calcium: 9.4 mg/dL (ref 8.6–10.4)
Chloride: 106 mmol/L (ref 98–110)
Creat: 0.85 mg/dL (ref 0.50–1.05)
Globulin: 3 g/dL (ref 1.9–3.7)
Glucose, Bld: 89 mg/dL (ref 65–99)
Potassium: 4.3 mmol/L (ref 3.5–5.3)
Sodium: 141 mmol/L (ref 135–146)
Total Bilirubin: 0.3 mg/dL (ref 0.2–1.2)
Total Protein: 7.3 g/dL (ref 6.1–8.1)
eGFR: 76 mL/min/1.73m2 (ref >=60)

## 2022-11-25 LAB — HEMOGLOBIN A1C
Hgb A1c MFr Bld: 6.1 % of total Hgb — ABNORMAL HIGH (ref <5.7)
Mean Plasma Glucose: 128 mg/dL
eAG (mmol/L): 7.1 mmol/L

## 2022-11-25 LAB — VITAMIN D 25 HYDROXY (VIT D DEFICIENCY, FRACTURES): Vit D, 25-Hydroxy: 29 ng/mL — ABNORMAL LOW (ref 30–100)

## 2022-11-25 LAB — LIPID PANEL
Cholesterol: 235 mg/dL — ABNORMAL HIGH (ref <200)
HDL: 66 mg/dL (ref >=50)
LDL Cholesterol (Calc): 144 mg/dL — ABNORMAL HIGH
Non-HDL Cholesterol (Calc): 169 mg/dL — ABNORMAL HIGH (ref <130)
Total CHOL/HDL Ratio: 3.6 (calc) (ref <5.0)
Triglycerides: 127 mg/dL (ref <150)

## 2022-11-25 LAB — TSH: TSH: 0.29 mIU/L — ABNORMAL LOW (ref 0.40–4.50)

## 2022-11-25 LAB — VITAMIN B12: Vitamin B-12: 328 pg/mL (ref 200–1100)

## 2022-11-25 MED ORDER — SYNTHROID 88 MCG PO TABS: 88.0000 ug | ORAL_TABLET | Freq: Every day | ORAL | 0 refills | Status: DC

## 2022-11-30 ENCOUNTER — Other Ambulatory Visit: Payer: Self-pay | Admitting: Family Medicine

## 2022-11-30 DIAGNOSIS — E039 Hypothyroidism, unspecified: Secondary | ICD-10-CM

## 2022-12-29 ENCOUNTER — Ambulatory Visit: Payer: Self-pay | Admitting: *Deleted

## 2022-12-29 ENCOUNTER — Encounter: Payer: Self-pay | Admitting: Family Medicine

## 2022-12-29 ENCOUNTER — Ambulatory Visit: Payer: BC Managed Care – PPO | Admitting: Family Medicine

## 2022-12-29 VITALS — BP 122/74 | HR 103 | Temp 97.7°F | Resp 16 | Ht 62.0 in | Wt 108.0 lb

## 2022-12-29 DIAGNOSIS — J029 Acute pharyngitis, unspecified: Secondary | ICD-10-CM | POA: Diagnosis not present

## 2022-12-29 DIAGNOSIS — B349 Viral infection, unspecified: Secondary | ICD-10-CM

## 2022-12-29 LAB — POCT RAPID STREP A (OFFICE): Rapid Strep A Screen: NEGATIVE

## 2022-12-29 MED ORDER — LIDOCAINE VISCOUS HCL 2 % MT SOLN: 15.0000 mL | Freq: Four times a day (QID) | OROMUCOSAL | 0 refills | Status: DC | PRN

## 2022-12-29 NOTE — Telephone Encounter (Signed)
  Chief Complaint: sore throat Symptoms: sore throat-swollen glands, fever, fatigue, headache Frequency: started Monday Pertinent Negatives: Patient denies difficulty breathing, rash Disposition: '[]'$ ED /'[]'$ Urgent Care (no appt availability in office) / '[x]'$ Appointment(In office/virtual)/ '[]'$  [place] [facility]/ '[]'$ Home Care/ '[]'$ Refused Recommended Disposition /'[]'$ [facility] [facility] [place] [facility]/ '[]'$  Follow-up with PCP Additional Notes: Call to office- appointment scheduled by office

## 2022-12-29 NOTE — Telephone Encounter (Signed)
Reason for Disposition  Fever present > 3 days (72 hours)  Answer Assessment - Initial Assessment Questions 1. ONSET: "When did the throat start hurting?" (Hours or days ago)      Monday night 2. SEVERITY: "How bad is the sore throat?" (Scale 1-10; mild, moderate or severe)   - MILD (1-3):  Doesn't interfere with eating or normal activities.   - MODERATE (4-7): Interferes with eating some solids and normal activities.   - SEVERE (8-10):  Excruciating pain, interferes with most normal activities.   - SEVERE WITH DYSPHAGIA (10): Can't swallow liquids, drooling.     mild 3. STREP EXPOSURE: "Has there been any exposure to strep within the past week?" If Yes, ask: "What type of contact occurred?"      unknown 4.  VIRAL SYMPTOMS: "Are there any symptoms of a cold, such as a runny nose, cough, hoarse voice or red eyes?"      Fever, exhaustion  5. FEVER: "Do you have a fever?" If Yes, ask: "What is your temperature, how was it measured, and when did it start?"     99 6. PUS ON THE TONSILS: "Is there pus on the tonsils in the back of your throat?"     no 7. OTHER SYMPTOMS: "Do you have any other symptoms?" (e.g., difficulty breathing, headache, rash)     headache 8. PREGNANCY: "Is there any chance you are pregnant?" "When was your last menstrual period?"     na  Protocols used: Sore Throat-A-AH

## 2022-12-29 NOTE — Progress Notes (Unsigned)
Name: Miranda Stafford   MRN: [id number]    DOB: October 01, 1956   Date:12/29/2022       Progress Note  Subjective  Chief Complaint  Sore Throat/ Fever  HPI  Patient developed fatigue late Monday, followed by sore throat, post-nasal drainage, body aches, fever - Tmax 101 - tender and swollen lymphadenopathy, lack of appetite. Home COVID-19 test was negative . Denies cough or sob, no change in bowel movements, denies nausea or vomiting   She states she is feeling better today, still has a sore throat, but appetite has improved a little ( able to eat today), fatigue not as intense.    Patient Active Problem List   Diagnosis Date Noted   Atherosclerosis of aorta ([place]) 06/22/2022   Skin sensation disturbance 05/05/2021   Connective tissue disease ([place]) 12/19/2019   Other neutropenia ([place]) 01/10/2018   Allergic rhinitis due to allergen 08/03/2017   B12 deficiency 02/25/2017   Family history of breast cancer in sister 05/24/2016   Hypothyroidism 09/19/2015   Asthma, moderate persistent, poorly-controlled 09/19/2015   Perennial allergic rhinitis with seasonal variation 09/19/2015   Major depression in remission ([place]) 09/19/2015    Past Surgical History:  Procedure Laterality Date   ABDOMINAL HYSTERECTOMY  2005   fibroid tumor   ADENOIDECTOMY     CESAREAN SECTION     ETHMOIDECTOMY Bilateral 02/28/2020   Procedure: TOTAL ETHMOIDECTOMY;  Surgeon: Clyde Canterbury, MD;  Location: [facility];  Service: ENT;  Laterality: Bilateral;   FRONTAL SINUS EXPLORATION Bilateral 02/28/2020   Procedure: FRONTAL SINUS EXPLORATION;  Surgeon: Clyde Canterbury, MD;  Location: [facility];  Service: ENT;  Laterality: Bilateral;   IMAGE GUIDED SINUS SURGERY N/A 02/28/2020   Procedure: IMAGE GUIDED SINUS SURGERY;  Surgeon: Clyde Canterbury, MD;  Location: [place];  Service: ENT;  Laterality: N/A;   MAXILLARY ANTROSTOMY Bilateral 02/28/2020   Procedure: MAXILLARY ANTROSTOMY WITH TISSUE REMOVAL;   Surgeon: Clyde Canterbury, MD;  Location: [place];  Service: ENT;  Laterality: Bilateral;   SPHENOIDECTOMY Bilateral 02/28/2020   Procedure: Coralee Pesa;  Surgeon: Clyde Canterbury, MD;  Location: [facility];  Service: ENT;  Laterality: Bilateral;   TUBAL LIGATION      Family History  Problem Relation Age of Onset   Heart disease Mother    Hypertension Mother    Hyperlipidemia Mother    SIDS Son    Breast cancer Sister 30   Hypothyroidism Sister    Hypertension Sister     Social History   Tobacco Use   Smoking status: Former    Packs/day: 0.50    Years: 5.00    Total pack years: 2.50    Types: Cigarettes    Start date: 11/16/1979    Quit date: 09/18/1985    Years since quitting: 37.3   Smokeless tobacco: Never  Substance Use Topics   Alcohol use: Yes    Alcohol/week: 0.0 standard drinks of alcohol    Comment: wine/socially/rarely     Current Outpatient Medications:    albuterol (VENTOLIN HFA) 108 (90 Base) MCG/ACT inhaler, Inhale 2 puffs into the lungs every 4 (four) hours as needed for wheezing or shortness of breath., Disp: 18 g, Rfl: 0   atorvastatin (LIPITOR) 20 MG tablet, Take 1 tablet (20 mg total) by mouth daily., Disp: 90 tablet, Rfl: 1   Cholecalciferol (VITAMIN D3) 50 MCG (2000 UT) capsule, Take 1 capsule (2,000 Units total) by mouth daily., Disp: 100 capsule, Rfl: 1   fluticasone furoate-vilanterol (BREO ELLIPTA) 100-25  MCG/ACT AEPB, Inhale 1 puff into the lungs daily., Disp: 1 each, Rfl: 5   loratadine (CLARITIN) 10 MG tablet, Take 10 mg by mouth daily., Disp: , Rfl:    montelukast (SINGULAIR) 10 MG tablet, Take 1 tablet (10 mg total) by mouth at bedtime., Disp: 90 tablet, Rfl: 1   SYNTHROID 88 MCG tablet, Take 1 tablet (88 mcg total) by mouth daily before breakfast. Take one M,T,W,T and half on Friday, Disp: 45 tablet, Rfl: 0   Olopatadine-Mometasone (RYALTRIS) 665-25 MCG/ACT SUSP, Place 2 sprays into the nose daily at 12 noon. (Patient not  taking: Reported on 12/29/2022), Disp: 29 g, Rfl: 5  Allergies  Allergen Reactions   Aspirin Nausea And Vomiting   Nsaids     Nausea and vomiting     I personally reviewed active problem list, medication list, allergies, family history, social history, health maintenance with the patient/caregiver today.   ROS  Ten systems reviewed and is negative except as mentioned in HPI   Objective  Vitals:   12/29/22 1519  BP: 122/74  Pulse: (!) 103  Resp: 16  Temp: 97.7 F (36.5 C)  TempSrc: Oral  SpO2: 98%  Weight: 108 lb (49 kg)  Height: 5' 2"$  (1.575 m)    Body mass index is 19.75 kg/m.  Physical Exam  Constitutional: Patient appears well-developed and well-nourished.  No distress.  HEENT: head atraumatic, normocephalic, pupils equal and reactive to light, ears normal TM, neck supple, throat within normal limits Cardiovascular: Normal rate, regular rhythm and normal heart sounds.  No murmur heard. No BLE edema. Pulmonary/Chest: Effort normal and breath sounds normal. No respiratory distress. Abdominal: Soft.  There is no tenderness. Psychiatric: Patient has a normal mood and affect. behavior is normal. Judgment and thought content normal.    PHQ2/9:    12/29/2022    3:14 PM 08/16/2022   10:35 AM 06/22/2022   10:13 AM 01/01/2022    9:55 AM 10/27/2021    1:28 PM  Depression screen PHQ 2/9  Decreased Interest 0 0 0 0 0  Down, Depressed, Hopeless 0 0 0 0 0  PHQ - 2 Score 0 0 0 0 0  Altered sleeping 1 0 0 0   Tired, decreased energy 0 1 3 0   Change in appetite 0 0 3 0   Feeling bad or failure about yourself  0 0 0 0   Trouble concentrating 0 0 0 0   Moving slowly or fidgety/restless 0 0 0 0   Suicidal thoughts 0 0 0 0   PHQ-9 Score 1 1 6 $ 0   Difficult doing work/chores    Not difficult at all     phq 9 is negative   Fall Risk:    12/29/2022    3:14 PM 08/16/2022   10:35 AM 06/22/2022   10:13 AM 01/01/2022    9:54 AM 10/27/2021    1:28 PM  Fall Risk   Falls in the  past year? 0 0 0 0 0  Number falls in past yr: 0 0 0 0 0  Injury with Fall? 0 0 0 0 0  Risk for fall due to : No Fall Risks No Fall Risks No Fall Risks No Fall Risks   Follow up Falls prevention discussed Falls prevention discussed Falls prevention discussed Falls prevention discussed Falls evaluation completed      Functional Status Survey: Is the patient deaf or have difficulty hearing?: No Does the patient have difficulty seeing, even when wearing glasses/contacts?: No  Does the patient have difficulty concentrating, remembering, or making decisions?: No Does the patient have difficulty walking or climbing stairs?: No Does the patient have difficulty dressing or bathing?: No Does the patient have difficulty doing errands alone such as visiting a doctor's office or shopping?: No    Assessment & Plan  1. Sore throat  - POCT rapid strep A - Novel Coronavirus, NAA ([facility])  - magic mouthwash (lidocaine, diphenhydrAMINE, alum & mag hydroxide) suspension; Swish and spit 15 mLs 4 (four) times daily as needed for mouth pain.  Dispense: 360 mL; Refill: 0   2. Viral illness  - Novel Coronavirus, NAA ([facility])

## 2022-12-30 LAB — NOVEL CORONAVIRUS, NAA: SARS-CoV-2, NAA: NOT DETECTED

## 2023-02-23 NOTE — Progress Notes (Deleted)
Name: Miranda Stafford   MRN: [id number]    DOB: [birth year]   Date:02/23/2023       Progress Note  Subjective  Chief Complaint  Follow Up  HPI  Connective tissue diease flare: she was evaluated by Dr. Kathi LudwigSyed,  she states her symptoms have resolved, the aching and pains resolved, no synovitis, feeling much better, Raynaud's still happens intermittently . Diagnosed in 2020 She states released from Dr. 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 She is in remission Still doing well    B12 deficiency: she is off B12 shots, we will recheck levels, no longer having paresthesias    Hypothyroidism: Levothyroxine 88 mcg M-F skipping weekends, denies hair loss, dry skin or change in bowel movements , weight is back to baseline and we will recheck labs in about 6 weeks    Leucopenia and anemia: seen by Dr. Smith Robertao in 2018, had bone marrow biopsy and was given reassurance. We will continue to monitor labs here   Hyperlipidemia/Atherosclerosis aorta: she never started crestor, got scared but willing to take atorvastatin    Nasal polyps/seasonal allergies: had sinus surgery, she has been out of singulair , we will try another nasal spray since the one she got from ENT is not working, she is not sure of the name    Asthma moderate persistent: she has been out of Singulair and Breo, she has noticed increase in nasal congestion, dry cough and SOB with activity. No wheezing. She states symptoms started with change in weather about 10 days ago   Patient Active Problem List   Diagnosis Date Noted   Atherosclerosis of aorta 06/22/2022   Skin sensation disturbance 05/05/2021   Connective tissue disease 12/19/2019   Other neutropenia 01/10/2018   Allergic rhinitis due to allergen 08/03/2017   B12 deficiency 02/25/2017   Family history of breast cancer in sister  05/24/2016   Hypothyroidism 09/19/2015   Asthma, moderate persistent, poorly-controlled 09/19/2015   Perennial allergic rhinitis with seasonal variation 09/19/2015   Major depression in remission 09/19/2015    Past Surgical History:  Procedure Laterality Date   ABDOMINAL HYSTERECTOMY  2005   fibroid tumor   ADENOIDECTOMY     CESAREAN SECTION     ETHMOIDECTOMY Bilateral 02/28/2020   Procedure: TOTAL ETHMOIDECTOMY;  Surgeon: Geanie LoganBennett, Paul, MD;  [facility];  Service: ENT;  Laterality: Bilateral;   FRONTAL SINUS EXPLORATION Bilateral 02/28/2020   Procedure: FRONTAL SINUS EXPLORATION;  Surgeon: Geanie LoganBennett, Paul, MD;  [facility];  Service: ENT;  Laterality: Bilateral;   IMAGE GUIDED SINUS SURGERY N/A 02/28/2020   Procedure: IMAGE GUIDED SINUS SURGERY;  Surgeon: Geanie LoganBennett, Paul, MD;  [facility];  Service: ENT;  Laterality: N/A;   MAXILLARY ANTROSTOMY Bilateral 02/28/2020   Procedure: MAXILLARY ANTROSTOMY WITH TISSUE REMOVAL;  Surgeon: Geanie LoganBennett, Paul, MD;  [facility];  Service: ENT;  Laterality: Bilateral;   SPHENOIDECTOMY Bilateral 02/28/2020   Procedure: Selina CooleySPHENOIDECTOMY;  Surgeon: Geanie LoganBennett, Paul, MD;  [facility];  Service: ENT;  Laterality: Bilateral;   TUBAL LIGATION      Family History  Problem Relation Age of Onset   Heart disease Mother    Hypertension Mother    Hyperlipidemia Mother    SIDS Son    Breast cancer Sister 1140   Hypothyroidism  Sister    Hypertension Sister     Social History   Tobacco Use   Smoking status: Former    Packs/day: 0.50    Years: 5.00    Additional pack years: 0.00    Total pack years: 2.50    Types: Cigarettes    Start date: 11/16/1979    Quit date: 09/18/1985    Years since quitting: 37.4   Smokeless tobacco: Never  Substance Use Topics   Alcohol use: Yes    Alcohol/week: 0.0 standard drinks of alcohol    Comment: wine/socially/rarely     Current Outpatient  Medications:    albuterol (VENTOLIN HFA) 108 (90 Base) MCG/ACT inhaler, Inhale 2 puffs into the lungs every 4 (four) hours as needed for wheezing or shortness of breath., Disp: 18 g, Rfl: 0   atorvastatin (LIPITOR) 20 MG tablet, Take 1 tablet (20 mg total) by mouth daily., Disp: 90 tablet, Rfl: 1   Cholecalciferol (VITAMIN D3) 50 MCG (2000 UT) capsule, Take 1 capsule (2,000 Units total) by mouth daily., Disp: 100 capsule, Rfl: 1   fluticasone furoate-vilanterol (BREO ELLIPTA) 100-25 MCG/ACT AEPB, Inhale 1 puff into the lungs daily., Disp: 1 each, Rfl: 5   loratadine (CLARITIN) 10 MG tablet, Take 10 mg by mouth daily., Disp: , Rfl:    magic mouthwash (lidocaine, diphenhydrAMINE, alum & mag hydroxide) suspension, Swish and spit 15 mLs 4 (four) times daily as needed for mouth pain., Disp: 360 mL, Rfl: 0   montelukast (SINGULAIR) 10 MG tablet, Take 1 tablet (10 mg total) by mouth at bedtime., Disp: 90 tablet, Rfl: 1   SYNTHROID 88 MCG tablet, Take 1 tablet (88 mcg total) by mouth daily before breakfast. Take one M,T,W,T and half on Friday, Disp: 45 tablet, Rfl: 0  Allergies  Allergen Reactions   Aspirin Nausea And Vomiting   Nsaids     Nausea and vomiting     I personally reviewed active problem list, medication list, allergies, family history, social history, health maintenance with the patient/caregiver today.   ROS  ***  Objective  There were no vitals filed for this visit.  There is no height or weight on file to calculate BMI.  Physical Exam ***  Recent Results (from the past 2160 hour(s))  POCT rapid strep A     Status: None   Collection Time: 12/29/22  3:18 PM  Result Value Ref Range   Rapid Strep A Screen Negative Negative  Novel Coronavirus, NAA ([facility])     Status: None   Collection Time: 12/29/22  6:59 PM   Specimen: Nasopharyngeal(NP) swabs in vial transport medium   Nasopharynge  Previous  Result Value Ref Range   SARS-CoV-2, NAA Not Detected Not Detected     Comment: This nucleic acid amplification test was developed and its performance characteristics determined by [employer]. Nucleic acid amplification tests include RT-PCR and TMA. This test has not been FDA cleared or approved. This test has been authorized by FDA under an Emergency Use Authorization (EUA). This test is only authorized for the duration of time the declaration that circumstances exist justifying the authorization of the emergency use of in vitro diagnostic tests for detection of SARS-CoV-2 virus and/or diagnosis of COVID-19 infection under section 564(b)(1) of the Act, 21 U.S.C. 390ZES-9(Q) (1), unless the authorization is terminated or revoked sooner. When diagnostic testing is negative, the possibility of a false negative result should be considered in the context of a patient's recent exposures and the presence of clinical signs and  symptoms consistent with COVID-19. An individual without symptoms of COVID-19 and who is not shedding SARS-CoV-2 virus wo uld expect to have a negative (not detected) result in this assay.     PHQ2/9:    12/29/2022    3:14 PM 08/16/2022   10:35 AM 06/22/2022   10:13 AM 01/01/2022    9:55 AM 10/27/2021    1:28 PM  Depression screen PHQ 2/9  Decreased Interest 0 0 0 0 0  Down, Depressed, Hopeless 0 0 0 0 0  PHQ - 2 Score 0 0 0 0 0  Altered sleeping 1 0 0 0   Tired, decreased energy 0 1 3 0   Change in appetite 0 0 3 0   Feeling bad or failure about yourself  0 0 0 0   Trouble concentrating 0 0 0 0   Moving slowly or fidgety/restless 0 0 0 0   Suicidal thoughts 0 0 0 0   PHQ-9 Score 1 1 6  0   Difficult doing work/chores    Not difficult at all     phq 9 is {gen pos [id number]}   Fall Risk:    12/29/2022    3:14 PM 08/16/2022   10:35 AM 06/22/2022   10:13 AM 01/01/2022    9:54 AM 10/27/2021    1:28 PM  Fall Risk   Falls in the past year? 0 0 0 0 0  Number falls in past yr: 0 0 0 0 0  Injury with Fall? 0 0 0 0 0  Risk  for fall due to : No Fall Risks No Fall Risks No Fall Risks No Fall Risks   Follow up Falls prevention discussed Falls prevention discussed Falls prevention discussed Falls prevention discussed Falls evaluation completed      Functional Status Survey:      Assessment & Plan  *** There are no diagnoses linked to this encounter.

## 2023-02-24 ENCOUNTER — Ambulatory Visit: Payer: BC Managed Care – PPO | Admitting: Family Medicine

## 2023-04-01 ENCOUNTER — Ambulatory Visit (INDEPENDENT_AMBULATORY_CARE_PROVIDER_SITE_OTHER): Payer: Medicare HMO | Admitting: Family Medicine

## 2023-04-01 ENCOUNTER — Encounter: Payer: Self-pay | Admitting: Family Medicine

## 2023-04-01 ENCOUNTER — Encounter: Payer: Self-pay | Admitting: Oncology

## 2023-04-01 VITALS — BP 116/64 | HR 67 | Temp 97.7°F | Resp 16 | Ht 62.0 in | Wt 108.5 lb

## 2023-04-01 DIAGNOSIS — E538 Deficiency of other specified B group vitamins: Secondary | ICD-10-CM

## 2023-04-01 DIAGNOSIS — R222 Localized swelling, mass and lump, trunk: Secondary | ICD-10-CM

## 2023-04-01 DIAGNOSIS — D649 Anemia, unspecified: Secondary | ICD-10-CM

## 2023-04-01 DIAGNOSIS — E039 Hypothyroidism, unspecified: Secondary | ICD-10-CM | POA: Diagnosis not present

## 2023-04-01 DIAGNOSIS — J4540 Moderate persistent asthma, uncomplicated: Secondary | ICD-10-CM

## 2023-04-01 DIAGNOSIS — R5383 Other fatigue: Secondary | ICD-10-CM | POA: Diagnosis not present

## 2023-04-01 DIAGNOSIS — E559 Vitamin D deficiency, unspecified: Secondary | ICD-10-CM | POA: Diagnosis not present

## 2023-04-01 DIAGNOSIS — D708 Other neutropenia: Secondary | ICD-10-CM

## 2023-04-01 DIAGNOSIS — M359 Systemic involvement of connective tissue, unspecified: Secondary | ICD-10-CM | POA: Diagnosis not present

## 2023-04-01 DIAGNOSIS — J3089 Other allergic rhinitis: Secondary | ICD-10-CM | POA: Diagnosis not present

## 2023-04-01 DIAGNOSIS — N6310 Unspecified lump in the right breast, unspecified quadrant: Secondary | ICD-10-CM

## 2023-04-01 NOTE — Progress Notes (Signed)
Patient ID: Kassia Staves, female    DOB: November 09, 1956, 67 y.o.   MRN: [id number]  PCP: Alba Cory, MD  Chief Complaint  Patient presents with   Fatigue    Onset for 2 weeks, been sleepy a lot    Subjective:   Nelrose Wimbley is a 67 y.o. female, presents to clinic with CC of the following:  HPI  Sleeping a lot, very fatigued for 2 weeks Not sleeping well at night, she can fall asleep but wakes up at night and can't go back to sleep for a few hours Prior sleep study -she states that her sleep patterns have not changed has been like this since she has been retired and she would not do a subsequent sleep study.  She has got extremely fatigued and tired the last 2 weeks without her sleep patterns changing at all -she is mostly concerned about rechecking her thyroid Anemia - not on iron supplement -she reports this was worked up by her PCP and hematology and she was discharged she does not know the underlying cause of her anemia B12 a little low-not on a supplement Vit D was near normal so she stopped supplement Last TSH was suppressed on levothyroxine 88 mcg 5 d a week  -the prescription was changed to 4 days a week and 1/2 pill on the fifth day on Fridays however she has not changed her prescription she denies being out of her medications and she would like her TSH rechecked. Lab Results  Component Value Date   TSH 0.29 (L) 11/24/2022    Hemoglobin  Date Value Ref Range Status  11/24/2022 10.4 (L) 11.7 - 15.5 g/dL Final  40/98/1191 47.8 (L) 12.0 - 15.0 g/dL Final  29/56/2130 86.5 (L) 11.7 - 15.5 g/dL Final  78/46/9629 52.8 (L) 12.0 - 15.0 g/dL Final   Lab Results  Component Value Date   VITAMINB12 328 11/24/2022   Last vitamin D Lab Results  Component Value Date   VD25OH 29 (L) 11/24/2022   Asthma - worse with the weather she is on breo and allergies and congestion no SOB -she does not think her asthma is related to her fatigue she overall reports no cough and no worst  breathing or shortness of breath Her allergies and congestion are much worse than normal  Feels moods are good     04/01/2023    1:17 PM 12/29/2022    3:14 PM 08/16/2022   10:35 AM  Depression screen PHQ 2/9  Decreased Interest 0 0 0  Down, Depressed, Hopeless 0 0 0  PHQ - 2 Score 0 0 0  Altered sleeping 0 1 0  Tired, decreased energy 0 0 1  Change in appetite 0 0 0  Feeling bad or failure about yourself  0 0 0  Trouble concentrating 0 0 0  Moving slowly or fidgety/restless 0 0 0  Suicidal thoughts 0 0 0  PHQ-9 Score 0 1 1  Difficult doing work/chores Not difficult at all     Knot on chest right anterior chest for a day -she states she is very concerned about this she just noticed it yesterday she also complains of pain from this it seems to radiate to her neck upper back and right arm  No weight changes, no appetite changes Wt Readings from Last 5 Encounters:  04/01/23 108 lb 8 oz (49.2 kg)  12/29/22 108 lb (49 kg)  08/16/22 109 lb (49.4 kg)  06/22/22 105 lb (47.6 kg)  06/15/22 110  lb 3.7 oz (50 kg)   BMI Readings from Last 5 Encounters:  04/01/23 19.84 kg/m  12/29/22 19.75 kg/m  08/16/22 19.94 kg/m  06/22/22 19.20 kg/m  06/15/22 20.16 kg/m       Patient Active Problem List   Diagnosis Date Noted   Atherosclerosis of aorta (HCC) 06/22/2022   Skin sensation disturbance 05/05/2021   Connective tissue disease (HCC) 12/19/2019   Other neutropenia (HCC) 01/10/2018   Allergic rhinitis due to allergen 08/03/2017   B12 deficiency 02/25/2017   Family history of breast cancer in sister 05/24/2016   Hypothyroidism 09/19/2015   Asthma, moderate persistent, poorly-controlled 09/19/2015   Perennial allergic rhinitis with seasonal variation 09/19/2015   Major depression in remission (HCC) 09/19/2015      Current Outpatient Medications:    albuterol (VENTOLIN HFA) 108 (90 Base) MCG/ACT inhaler, Inhale 2 puffs into the lungs every 4 (four) hours as needed for wheezing  or shortness of breath., Disp: 18 g, Rfl: 0   atorvastatin (LIPITOR) 20 MG tablet, Take 1 tablet (20 mg total) by mouth daily., Disp: 90 tablet, Rfl: 1   Cholecalciferol (VITAMIN D3) 50 MCG (2000 UT) capsule, Take 1 capsule (2,000 Units total) by mouth daily., Disp: 100 capsule, Rfl: 1   fluticasone furoate-vilanterol (BREO ELLIPTA) 100-25 MCG/ACT AEPB, Inhale 1 puff into the lungs daily., Disp: 1 each, Rfl: 5   loratadine (CLARITIN) 10 MG tablet, Take 10 mg by mouth daily., Disp: , Rfl:    magic mouthwash (lidocaine, diphenhydrAMINE, alum & mag hydroxide) suspension, Swish and spit 15 mLs 4 (four) times daily as needed for mouth pain., Disp: 360 mL, Rfl: 0   montelukast (SINGULAIR) 10 MG tablet, Take 1 tablet (10 mg total) by mouth at bedtime., Disp: 90 tablet, Rfl: 1   SYNTHROID 88 MCG tablet, Take 1 tablet (88 mcg total) by mouth daily before breakfast. Take one M,T,W,T and half on Friday, Disp: 45 tablet, Rfl: 0   Allergies  Allergen Reactions   Aspirin Nausea And Vomiting   Nsaids     Nausea and vomiting      Social History   Tobacco Use   Smoking status: Former    Packs/day: 0.50    Years: 5.00    Additional pack years: 0.00    Total pack years: 2.50    Types: Cigarettes    Start date: 11/16/1979    Quit date: 09/18/1985    Years since quitting: 37.5   Smokeless tobacco: Never  Vaping Use   Vaping Use: Never used  Substance Use Topics   Alcohol use: Yes    Alcohol/week: 0.0 standard drinks of alcohol    Comment: wine/socially/rarely   Drug use: No      Chart Review Today: I personally reviewed active problem list, medication list, allergies, family history, social history, health maintenance, notes from last encounter, lab results, imaging with the patient/caregiver today.   Review of Systems  Constitutional: Negative.   HENT: Negative.    Eyes: Negative.   Respiratory: Negative.    Cardiovascular: Negative.   Gastrointestinal: Negative.   Endocrine: Negative.    Genitourinary: Negative.   Musculoskeletal: Negative.   Skin: Negative.   Allergic/Immunologic: Negative.   Neurological: Negative.   Hematological: Negative.   Psychiatric/Behavioral: Negative.    All other systems reviewed and are negative.      Objective:   Vitals:   04/01/23 1318  BP: 116/64  Pulse: 67  Resp: 16  Temp: 97.7 F (36.5 C)  TempSrc: Oral  SpO2:  100%  Weight: 108 lb 8 oz (49.2 kg)  Height: 5\' 2"  (1.575 m)    Body mass index is 19.84 kg/m.  Physical Exam Vitals and nursing note reviewed.  Constitutional:      General: She is not in acute distress.    Appearance: Normal appearance. She is well-developed. She is not ill-appearing, toxic-appearing or diaphoretic.     Comments: Thin, well-appearing female looks stated age  HENT:     Head: Normocephalic and atraumatic.     Right Ear: External ear normal.     Left Ear: External ear normal.     Nose: Rhinorrhea present. No congestion. Rhinorrhea is clear.     Right Turbinates: Swollen and pale.     Left Turbinates: Swollen and pale.     Mouth/Throat:     Mouth: Mucous membranes are moist.     Pharynx: Oropharynx is clear. No oropharyngeal exudate or posterior oropharyngeal erythema.  Eyes:     General: No scleral icterus.       Right eye: No discharge.        Left eye: No discharge.     Conjunctiva/sclera: Conjunctivae normal.  Neck:     Trachea: No tracheal deviation.  Cardiovascular:     Rate and Rhythm: Normal rate and regular rhythm.     Pulses: Normal pulses.     Heart sounds: No murmur heard.    No friction rub. No gallop.  Pulmonary:     Effort: Pulmonary effort is normal. No respiratory distress.     Breath sounds: Normal breath sounds. No stridor.  Chest:  Breasts:    Right: Mass present.     Comments: Right anterior chest wall/breast solid nonmobile mass roughly 2.5 cm in diameter Abdominal:     Palpations: Abdomen is soft.  Musculoskeletal:        General: Normal range of motion.      Cervical back: Normal range of motion. No erythema or rigidity. Muscular tenderness (right neck and upper right trap) present. Normal range of motion.  Skin:    General: Skin is warm and dry.     Findings: No rash.  Neurological:     Mental Status: She is alert.     Motor: No abnormal muscle tone.     Coordination: Coordination normal.  Psychiatric:        Behavior: Behavior normal.      Results for orders placed or performed in visit on 12/29/22  Novel Coronavirus, NAA ([facility])   Specimen: Nasopharyngeal(NP) swabs in vial transport medium   Nasopharynge  Previous  Result Value Ref Range   SARS-CoV-2, NAA Not Detected Not Detected  POCT rapid strep A  Result Value Ref Range   Rapid Strep A Screen Negative Negative       Assessment & Plan:   1. Other fatigue Patient presents with 2 weeks of increased fatigue and sleepiness Multiple chronic conditions including anemia, hypothyroid, B12 deficiency and vitamin D deficiency also connective tissue disorder which patient explains has flares of inflammation and pain and sometimes  She is complaining of right neck and shoulder pain I explained to her that there are innumerable possible diagnoses which could cause fatigue -her biggest concern is her thyroid (see below) She refuses to do any sleep evaluation despite reporting waking up every night for several hours in the middle of the night - CBC with Differential/Platelet - COMPLETE METABOLIC PANEL WITH GFR - TSH - VITAMIN D 25 Hydroxy (Vit-D Deficiency, Fractures) - Vitamin B12  2. Hypothyroidism, adult Last TSH was in February which was suppressed her PCP reduced her med dose however the patient has not adjusted her medications and she reports she has not run out of medication she is continue to take 88 mcg 5 days a week Wishes to have the TSH rechecked today - CBC with Differential/Platelet - TSH  3. Asthma, moderate persistent, poorly-controlled She reports her asthma  is well-controlled and she denies any coughing or increased respiratory symptoms although she sounds very congested and a later point in the encounter she reported some dyspnea on exertion - CBC with Differential/Platelet  4. Non-seasonal allergic rhinitis due to other allergic trigger Patient is very congested with clear nasal discharge and very large boggy nasal turbinates bilaterally - CBC with Differential/Platelet  5. Connective tissue disease (HCC) Reports negative prior workup for lupus but her PCP suspects some autoimmune or connective tissue disease which tends to flareup here and there Is unclear if this is a flare or not she does complain of multiple areas of pain and fatigue  6. B12 deficiency Recheck and optimize supplementation if still low - CBC with Differential/Platelet - Vitamin B12  7. Other neutropenia (HCC) She reports chronic neutropenia and being discharged from hematology - CBC with Differential/Platelet  8. Vitamin D deficiency She thought her last labs look better so she stopped her supplement - COMPLETE METABOLIC PANEL WITH GFR - VITAMIN D 25 Hydroxy (Vit-D Deficiency, Fractures)  9. Anemia, unspecified type Patient cannot tell me the etiology of her anemia but states she was discharged from hematology she is not on any supplements or iron - CBC with Differential/Platelet  10. Mass of right chest wall Difficult to tell if it is in the breast tissue or chest wall, imaging ordered, last mammogram was January - [facility] Unilat R, right [place] axilla ordered      Danelle Berry, PA-C 04/01/23 1:36 PM

## 2023-04-02 LAB — CBC WITH DIFFERENTIAL/PLATELET
Absolute Monocytes: 168 cells/uL — ABNORMAL LOW (ref 200–950)
Basophils Absolute: 20 cells/uL (ref 0–200)
Basophils Relative: 0.5 %
Eosinophils Absolute: 347 cells/uL (ref 15–500)
Eosinophils Relative: 8.9 %
HCT: 33.9 % — ABNORMAL LOW (ref 35.0–45.0)
Hemoglobin: 10.6 g/dL — ABNORMAL LOW (ref 11.7–15.5)
Lymphs Abs: 2020 cells/uL (ref 850–3900)
MCH: 25.7 pg — ABNORMAL LOW (ref 27.0–33.0)
MCHC: 31.3 g/dL — ABNORMAL LOW (ref 32.0–36.0)
MCV: 82.3 fL (ref 80.0–100.0)
MPV: 10.8 fL (ref 7.5–12.5)
Monocytes Relative: 4.3 %
Neutro Abs: 1346 cells/uL — ABNORMAL LOW (ref 1500–7800)
Neutrophils Relative %: 34.5 %
Platelets: 199 10*3/uL (ref 140–400)
RBC: 4.12 10*6/uL (ref 3.80–5.10)
RDW: 13.4 % (ref 11.0–15.0)
Total Lymphocyte: 51.8 %
WBC: 3.9 10*3/uL (ref 3.8–10.8)

## 2023-04-02 LAB — COMPLETE METABOLIC PANEL WITH GFR
AG Ratio: 1.5 (calc) (ref 1.0–2.5)
ALT: 22 U/L (ref 6–29)
AST: 35 U/L (ref 10–35)
Albumin: 4.1 g/dL (ref 3.6–5.1)
Alkaline phosphatase (APISO): 63 U/L (ref 37–153)
BUN: 10 mg/dL (ref 7–25)
CO2: 26 mmol/L (ref 20–32)
Calcium: 9.1 mg/dL (ref 8.6–10.4)
Chloride: 107 mmol/L (ref 98–110)
Creat: 0.91 mg/dL (ref 0.50–1.05)
Globulin: 2.7 g/dL (calc) (ref 1.9–3.7)
Glucose, Bld: 80 mg/dL (ref 65–99)
Potassium: 4.3 mmol/L (ref 3.5–5.3)
Sodium: 141 mmol/L (ref 135–146)
Total Bilirubin: 0.2 mg/dL (ref 0.2–1.2)
Total Protein: 6.8 g/dL (ref 6.1–8.1)
eGFR: 70 mL/min/{1.73_m2} (ref >=60)

## 2023-04-02 LAB — TSH: TSH: 0.14 mIU/L — ABNORMAL LOW (ref 0.40–4.50)

## 2023-04-02 LAB — VITAMIN B12: Vitamin B-12: 354 pg/mL (ref 200–1100)

## 2023-04-02 LAB — VITAMIN D 25 HYDROXY (VIT D DEFICIENCY, FRACTURES): Vit D, 25-Hydroxy: 28 ng/mL — ABNORMAL LOW (ref 30–100)

## 2023-04-04 ENCOUNTER — Other Ambulatory Visit: Payer: Self-pay | Admitting: Family Medicine

## 2023-04-04 DIAGNOSIS — E039 Hypothyroidism, unspecified: Secondary | ICD-10-CM

## 2023-04-04 MED ORDER — SYNTHROID 88 MCG PO TABS
ORAL_TABLET | ORAL | 0 refills | Status: DC
Start: 2023-04-04 — End: 2023-07-20

## 2023-04-07 NOTE — Progress Notes (Signed)
Name: Miranda Stafford   MRN: [id number]    DOB: [birth year]   Date:04/08/2023       Progress Note  Subjective  Chief Complaint  Discuss Thyroid  HPI  Hypothyroidism: she was recently seen by Danelle Berry , PA for evaluation of fatigue. She had multiple labs done and reviewed it with patient today . Her last TSH was suppressed and we decreased dose to one tablets M-T and half on Fridays but she is still taking one 88 mcg tablets 5 days a week. Explained TSH high and low can cause fatigue. She will make adjustment on dose and return for labs in about 6 weeks   Atherosclerosis of aorta: she stopped taking Atorvastatin, discussed plaque build up on vessels and increase risk of heart attacks and strokes but she does not want to resume statin therapy  Pneumococcal vaccine: refused   Patient Active Problem List   Diagnosis Date Noted   Atherosclerosis of aorta (HCC) 06/22/2022   Skin sensation disturbance 05/05/2021   Connective tissue disease (HCC) 12/19/2019   Other neutropenia (HCC) 01/10/2018   Allergic rhinitis due to allergen 08/03/2017   B12 deficiency 02/25/2017   Family history of breast cancer in sister 05/24/2016   Hypothyroidism 09/19/2015   Asthma, moderate persistent, poorly-controlled 09/19/2015   Perennial allergic rhinitis with seasonal variation 09/19/2015    Past Surgical History:  Procedure Laterality Date   ABDOMINAL HYSTERECTOMY  2005   fibroid tumor   ADENOIDECTOMY     CESAREAN SECTION     ETHMOIDECTOMY Bilateral 02/28/2020   Procedure: TOTAL ETHMOIDECTOMY;  Surgeon: Geanie Logan, MD;  Location: [facility];  Service: ENT;  Laterality: Bilateral;   FRONTAL SINUS EXPLORATION Bilateral 02/28/2020   Procedure: FRONTAL SINUS EXPLORATION;  Surgeon: Geanie Logan, MD;  Location: [facility];  Service: ENT;  Laterality: Bilateral;   IMAGE GUIDED SINUS SURGERY N/A 02/28/2020   Procedure: IMAGE GUIDED SINUS SURGERY;  Surgeon: Geanie Logan, MD;  Location:  [facility];  Service: ENT;  Laterality: N/A;   MAXILLARY ANTROSTOMY Bilateral 02/28/2020   Procedure: MAXILLARY ANTROSTOMY WITH TISSUE REMOVAL;  Surgeon: Geanie Logan, MD;  Location: [facility];  Service: ENT;  Laterality: Bilateral;   SPHENOIDECTOMY Bilateral 02/28/2020   Procedure: Selina Cooley;  Surgeon: Geanie Logan, MD;  Location: [facility];  Service: ENT;  Laterality: Bilateral;   TUBAL LIGATION      Family History  Problem Relation Age of Onset   Heart disease Mother    Hypertension Mother    Hyperlipidemia Mother    SIDS Son    Breast cancer Sister 30   Hypothyroidism Sister    Hypertension Sister     Social History   Tobacco Use   Smoking status: Former    Packs/day: 0.50    Years: 5.00    Additional pack years: 0.00    Total pack years: 2.50    Types: Cigarettes    Start date: 11/16/1979    Quit date: 09/18/1985    Years since quitting: 37.5   Smokeless tobacco: Never  Substance Use Topics   Alcohol use: Yes    Alcohol/week: 0.0 standard drinks of alcohol    Comment: wine/socially/rarely     Current Outpatient Medications:    albuterol (VENTOLIN HFA) 108 (90 Base) MCG/ACT inhaler, Inhale 2 puffs into the lungs every 4 (four) hours as needed for wheezing or shortness of breath., Disp: 18 g, Rfl: 0   Cholecalciferol (VITAMIN D3) 50 MCG (2000 UT) capsule, Take 1 capsule (2,000  Units total) by mouth daily., Disp: 100 capsule, Rfl: 1   fluticasone furoate-vilanterol (BREO ELLIPTA) 100-25 MCG/ACT AEPB, Inhale 1 puff into the lungs daily., Disp: 1 each, Rfl: 5   loratadine (CLARITIN) 10 MG tablet, Take 10 mg by mouth daily., Disp: , Rfl:    montelukast (SINGULAIR) 10 MG tablet, Take 1 tablet (10 mg total) by mouth at bedtime., Disp: 90 tablet, Rfl: 1   SYNTHROID 88 MCG tablet, Take 1 tablet (88 mcg total) by mouth daily before breakfast on  M,T,W,T and take half tab (44 mcg) poqam on Friday, no dose on sat/sun, Disp: 54 tablet, Rfl:  0  Allergies  Allergen Reactions   Aspirin Nausea And Vomiting   Nsaids     Nausea and vomiting     I personally reviewed active problem list, medication list, allergies, family history, social history, health maintenance with the patient/caregiver today.   ROS  Ten systems reviewed and is negative except as mentioned in HPI   Objective  Vitals:   04/08/23 1358  BP: 114/66  Pulse: 86  Resp: 14  Temp: 97.7 F (36.5 C)  TempSrc: Oral  SpO2: 95%  Weight: 109 lb 14.4 oz (49.9 kg)  Height: 5\' 2"  (1.575 m)    Body mass index is 20.1 kg/m.  Physical Exam  Constitutional: Patient appears well-developed and well-nourished. Petite  No distress.  HEENT: head atraumatic, normocephalic, pupils equal and reactive to light, neck supple Cardiovascular: Normal rate, regular rhythm and normal heart sounds.  No murmur heard. No BLE edema. Pulmonary/Chest: Effort normal and breath sounds normal. No respiratory distress. Abdominal: Soft.  There is no tenderness. Psychiatric: Patient has a normal mood and affect. behavior is normal. Judgment and thought content normal.   Recent Results (from the past 2160 hour(s))  CBC with Differential/Platelet     Status: Abnormal   Collection Time: 04/01/23  1:54 PM  Result Value Ref Range   WBC 3.9 3.8 - 10.8 Thousand/uL   RBC 4.12 3.80 - 5.10 Million/uL   Hemoglobin 10.6 (L) 11.7 - 15.5 g/dL   HCT 16.1 (L) 09.6 - 04.5 %   MCV 82.3 80.0 - 100.0 fL   MCH 25.7 (L) 27.0 - 33.0 pg   MCHC 31.3 (L) 32.0 - 36.0 g/dL   RDW 40.9 81.1 - 91.4 %   Platelets 199 140 - 400 Thousand/uL   MPV 10.8 7.5 - 12.5 fL   Neutro Abs 1,346 (L) 1,500 - 7,800 cells/uL   Lymphs Abs 2,020 850 - 3,900 cells/uL   Absolute Monocytes 168 (L) 200 - 950 cells/uL   Eosinophils Absolute 347 15 - 500 cells/uL   Basophils Absolute 20 0 - 200 cells/uL   Neutrophils Relative % 34.5 %   Total Lymphocyte 51.8 %   Monocytes Relative 4.3 %   Eosinophils Relative 8.9 %   Basophils  Relative 0.5 %  COMPLETE METABOLIC PANEL WITH GFR     Status: None   Collection Time: 04/01/23  1:54 PM  Result Value Ref Range   Glucose, Bld 80 65 - 99 mg/dL    Comment: .            Fasting reference interval .    BUN 10 7 - 25 mg/dL   Creat 7.82 9.56 - 2.13 mg/dL   eGFR 70 > OR = 60 YQ/MVH/8.46N6   BUN/Creatinine Ratio SEE NOTE: 6 - 22 (calc)    Comment:    Not Reported: BUN and Creatinine are within    reference  range. .    Sodium 141 135 - 146 mmol/L   Potassium 4.3 3.5 - 5.3 mmol/L   Chloride 107 98 - 110 mmol/L   CO2 26 20 - 32 mmol/L   Calcium 9.1 8.6 - 10.4 mg/dL   Total Protein 6.8 6.1 - 8.1 g/dL   Albumin 4.1 3.6 - 5.1 g/dL   Globulin 2.7 1.9 - 3.7 g/dL (calc)   AG Ratio 1.5 1.0 - 2.5 (calc)   Total Bilirubin 0.2 0.2 - 1.2 mg/dL   Alkaline phosphatase (APISO) 63 37 - 153 U/L   AST 35 10 - 35 U/L   ALT 22 6 - 29 U/L  TSH     Status: Abnormal   Collection Time: 04/01/23  1:54 PM  Result Value Ref Range   TSH 0.14 (L) 0.40 - 4.50 mIU/L  VITAMIN D 25 Hydroxy (Vit-D Deficiency, Fractures)     Status: Abnormal   Collection Time: 04/01/23  1:54 PM  Result Value Ref Range   Vit D, 25-Hydroxy 28 (L) 30 - 100 ng/mL    Comment: Vitamin D Status         25-OH Vitamin D: . Deficiency:                    <20 ng/mL Insufficiency:             20 - 29 ng/mL Optimal:                 > or = 30 ng/mL . For 25-OH Vitamin D testing on patients on  D2-supplementation and patients for whom quantitation  of D2 and D3 fractions is required, the [facility](TM) 25-OH VIT D, (D2,D3), LC/MS/MS is recommended: order  code 19147 (patients >17yrs). . See Note 1 . Note 1 . For additional information, please refer to  http://education.[facility].com/faq/FAQ199  (This link is being provided for informational/ educational purposes only.)   Vitamin B12     Status: None   Collection Time: 04/01/23  1:54 PM  Result Value Ref Range   Vitamin B-12 354 200 - 1,100 pg/mL     Comment: . Please Note: Although the reference range for vitamin B12 is [id number] pg/mL, it has been reported that between 5 and 10% of patients with values between 200 and 400 pg/mL may experience neuropsychiatric and hematologic abnormalities due to occult B12 deficiency; less than 1% of patients with values above 400 pg/mL will have symptoms. .     PHQ2/9:    04/08/2023    2:00 PM 04/01/2023    1:17 PM 12/29/2022    3:14 PM 08/16/2022   10:35 AM 06/22/2022   10:13 AM  Depression screen PHQ 2/9  Decreased Interest 0 0 0 0 0  Down, Depressed, Hopeless 0 0 0 0 0  PHQ - 2 Score 0 0 0 0 0  Altered sleeping 0 0 1 0 0  Tired, decreased energy 0 0 0 1 3  Change in appetite 0 0 0 0 3  Feeling bad or failure about yourself  0 0 0 0 0  Trouble concentrating 0 0 0 0 0  Moving slowly or fidgety/restless 0 0 0 0 0  Suicidal thoughts 0 0 0 0 0  PHQ-9 Score 0 0 1 1 6   Difficult doing work/chores  Not difficult at all       phq 9 is negative   Fall Risk:    04/08/2023    2:00 PM 04/01/2023    1:17 PM 12/29/2022  3:14 PM 08/16/2022   10:35 AM 06/22/2022   10:13 AM  Fall Risk   Falls in the past year? 0 0 0 0 0  Number falls in past yr:  0 0 0 0  Injury with Fall?  0 0 0 0  Risk for fall due to : No Fall Risks No Fall Risks No Fall Risks No Fall Risks No Fall Risks  Follow up Falls prevention discussed Falls prevention discussed;Education provided;Falls evaluation completed Falls prevention discussed Falls prevention discussed Falls prevention discussed     Assessment & Plan  1. Hypothyroidism, adult  - DG Bone Density; Future  She just takes Synthoroid 88 mcg daily and was advised to take half once a week and we will recheck it in 6 weeks   2. Osteoporosis screening  - DG Bone Density; Future   3. Atherosclerosis of aorta (HCC)  She stopped taking statin therapy on her own, she does not want to start it today

## 2023-04-08 ENCOUNTER — Encounter: Payer: Self-pay | Admitting: Oncology

## 2023-04-08 ENCOUNTER — Ambulatory Visit (INDEPENDENT_AMBULATORY_CARE_PROVIDER_SITE_OTHER): Payer: Medicare HMO | Admitting: Family Medicine

## 2023-04-08 ENCOUNTER — Encounter: Payer: Self-pay | Admitting: Family Medicine

## 2023-04-08 VITALS — BP 114/66 | HR 86 | Temp 97.7°F | Resp 14 | Ht 62.0 in | Wt 109.9 lb

## 2023-04-08 DIAGNOSIS — E039 Hypothyroidism, unspecified: Secondary | ICD-10-CM | POA: Diagnosis not present

## 2023-04-08 DIAGNOSIS — Z13820 Encounter for screening for osteoporosis: Secondary | ICD-10-CM | POA: Diagnosis not present

## 2023-04-08 DIAGNOSIS — I700 Atherosclerosis of aorta: Secondary | ICD-10-CM

## 2023-04-15 ENCOUNTER — Other Ambulatory Visit: Payer: BC Managed Care – PPO

## 2023-04-18 ENCOUNTER — Ambulatory Visit
Admission: RE | Admit: 2023-04-18 | Discharge: 2023-04-18 | Disposition: A | Discharge status: Home / Self Care | Payer: Medicare HMO | Source: Ambulatory Visit | Attending: Family Medicine | Admitting: Family Medicine

## 2023-04-18 DIAGNOSIS — N6312 Unspecified lump in the right breast, upper inner quadrant: Secondary | ICD-10-CM | POA: Insufficient documentation

## 2023-04-18 DIAGNOSIS — N6310 Unspecified lump in the right breast, unspecified quadrant: Secondary | ICD-10-CM | POA: Insufficient documentation

## 2023-04-18 DIAGNOSIS — R222 Localized swelling, mass and lump, trunk: Secondary | ICD-10-CM | POA: Insufficient documentation

## 2023-05-17 ENCOUNTER — Emergency Department
Admission: EM | Admit: 2023-05-17 | Discharge: 2023-05-17 | Disposition: A | Discharge status: Home / Self Care | Payer: Medicare HMO | Attending: Emergency Medicine | Admitting: Emergency Medicine

## 2023-05-17 ENCOUNTER — Emergency Department: Payer: Medicare HMO

## 2023-05-17 ENCOUNTER — Other Ambulatory Visit: Payer: Self-pay

## 2023-05-17 ENCOUNTER — Encounter: Payer: Self-pay | Admitting: Emergency Medicine

## 2023-05-17 DIAGNOSIS — R109 Unspecified abdominal pain: Secondary | ICD-10-CM | POA: Diagnosis not present

## 2023-05-17 DIAGNOSIS — N83291 Other ovarian cyst, right side: Secondary | ICD-10-CM | POA: Diagnosis not present

## 2023-05-17 DIAGNOSIS — R319 Hematuria, unspecified: Secondary | ICD-10-CM | POA: Diagnosis not present

## 2023-05-17 LAB — CBC
HCT: 31.2 % — ABNORMAL LOW (ref 36.0–46.0)
Hemoglobin: 9.9 g/dL — ABNORMAL LOW (ref 12.0–15.0)
MCH: 25.4 pg — ABNORMAL LOW (ref 26.0–34.0)
MCHC: 31.7 g/dL (ref 30.0–36.0)
MCV: 80.2 fL (ref 80.0–100.0)
Platelets: 187 10*3/uL (ref 150–400)
RBC: 3.89 MIL/uL (ref 3.87–5.11)
RDW: 13.6 % (ref 11.5–15.5)
WBC: 3.3 10*3/uL — ABNORMAL LOW (ref 4.0–10.5)
nRBC: 0 % (ref 0.0–0.2)

## 2023-05-17 LAB — URINALYSIS, ROUTINE W REFLEX MICROSCOPIC
Bacteria, UA: NONE SEEN
Bilirubin Urine: NEGATIVE
Glucose, UA: NEGATIVE mg/dL
Ketones, ur: NEGATIVE mg/dL
Leukocytes,Ua: NEGATIVE
Nitrite: NEGATIVE
Protein, ur: NEGATIVE mg/dL
Specific Gravity, Urine: 1.01 (ref 1.005–1.030)
pH: 7 (ref 5.0–8.0)

## 2023-05-17 LAB — COMPREHENSIVE METABOLIC PANEL
ALT: 14 U/L (ref 0–44)
AST: 25 U/L (ref 15–41)
Albumin: 3.8 g/dL (ref 3.5–5.0)
Alkaline Phosphatase: 51 U/L (ref 38–126)
Anion gap: 8 (ref 5–15)
BUN: 12 mg/dL (ref 8–23)
CO2: 23 mmol/L (ref 22–32)
Calcium: 8.8 mg/dL — ABNORMAL LOW (ref 8.9–10.3)
Chloride: 105 mmol/L (ref 98–111)
Creatinine, Ser: 0.75 mg/dL (ref 0.44–1.00)
GFR, Estimated: >60 mL/min (ref >60)
Glucose, Bld: 99 mg/dL (ref 70–99)
Potassium: 4.2 mmol/L (ref 3.5–5.1)
Sodium: 136 mmol/L (ref 135–145)
Total Bilirubin: 0.6 mg/dL (ref 0.3–1.2)
Total Protein: 6.8 g/dL (ref 6.5–8.1)

## 2023-05-17 LAB — LIPASE, BLOOD: Lipase: 32 U/L (ref 11–51)

## 2023-05-17 MED ORDER — ONDANSETRON HCL 4 MG/2ML IJ SOLN
4.0000 mg | Freq: Once | INTRAMUSCULAR | Status: AC
  Administered 2023-05-17: 4 mg via INTRAVENOUS
  Filled 2023-05-17: qty 2

## 2023-05-17 MED ORDER — MORPHINE SULFATE (PF) 4 MG/ML IV SOLN
4.0000 mg | Freq: Once | INTRAVENOUS | Status: AC
  Administered 2023-05-17: 4 mg via INTRAVENOUS
  Filled 2023-05-17: qty 1

## 2023-05-17 NOTE — Discharge Instructions (Signed)
I suspect he passed a kidney stone because of the small amount of blood in your urine and the flank pain that you are having that is better now.  CT scan is very reassuring

## 2023-05-17 NOTE — ED Triage Notes (Signed)
Pt here with right flank pain that started last night. Pt thought her pain was just gas but states it has gotten stronger in nature. Pt moaning in triage.

## 2023-05-17 NOTE — ED Provider Notes (Signed)
[facility] Provider Note    Event Date/Time   First MD Initiated Contact with Patient 05/17/23 1420     (approximate)   History   Flank Pain   HPI  Miranda Stafford is a 67 y.o. female presents with complaints of right flank pain which started last night and is worsened this morning, she reports the pain is severe in nature.  She denies fevers or chills.  No dysuria reported.  No history of kidney stones has not take anything for this.     Physical Exam   Triage Vital Signs: ED Triage Vitals  Enc Vitals Group     BP 05/17/23 1357 121/78     Pulse Rate 05/17/23 1356 81     Resp 05/17/23 1358 18     Temp 05/17/23 1356 (!) 97.3 F (36.3 C)     Temp Source 05/17/23 1356 Oral     SpO2 05/17/23 1358 100 %     Weight 05/17/23 1357 49.9 kg (110 lb 0.2 oz)     Height 05/17/23 1357 1.575 m (5\' 2" )     Head Circumference --      Peak Flow --      Pain Score 05/17/23 1357 10     Pain Loc --      Pain Edu? --      Excl. in GC? --     Most recent vital signs: Vitals:   05/17/23 1358 05/17/23 1719  BP:  120/70  Pulse:  78  Resp: 18 18  Temp:    SpO2: 100% 100%     General: Awake, clearly uncomfortable CV:  Good peripheral perfusion.  Resp:  Normal effort.  Abd:  No distention.  Other:     ED Results / Procedures / Treatments   Labs (all labs ordered are listed, but only abnormal results are displayed) Labs Reviewed  CBC - Abnormal; Notable for the following components:      Result Value   WBC 3.3 (*)    Hemoglobin 9.9 (*)    HCT 31.2 (*)    MCH 25.4 (*)    All other components within normal limits  URINALYSIS, ROUTINE W REFLEX MICROSCOPIC - Abnormal; Notable for the following components:   Color, Urine YELLOW (*)    APPearance CLEAR (*)    Hgb urine dipstick SMALL (*)    All other components within normal limits  COMPREHENSIVE METABOLIC PANEL - Abnormal; Notable for the following components:   Calcium 8.8 (*)    All other  components within normal limits  LIPASE, BLOOD     EKG     RADIOLOGY CT renal stone study viewed interpret by me, no stone seen, pending radiology review    PROCEDURES:  Critical Care performed:   Procedures   MEDICATIONS ORDERED IN ED: Medications  morphine (PF) 4 MG/ML injection 4 mg (4 mg Intravenous Given 05/17/23 1515)  ondansetron (ZOFRAN) injection 4 mg (4 mg Intravenous Given 05/17/23 1513)     IMPRESSION / MDM / ASSESSMENT AND PLAN / ED COURSE  I reviewed the triage vital signs and the nursing notes. Patient's presentation is most consistent with acute presentation with potential threat to life or bodily function.  Patient presents with right-sided flank pain and abdominal pain as detailed above.  Differential includes ureterolithiasis, UTI, pyelonephritis, appendicitis  Will treat with IV morphine, IV Zofran, obtain labs, urinalysis, CT renal stone study  CT scan is reassuring, no evidence of ureterolithiasis however she does have hematuria  on her UA no signs of infection, question possible passed stone, she is feeling improved and has no pain, appropriate for discharge at this time, return precautions discussed      FINAL CLINICAL IMPRESSION(S) / ED DIAGNOSES   Final diagnoses:  Flank pain  Hematuria, unspecified type     Rx / DC Orders   ED Discharge Orders     None        Note:  This document was prepared using Dragon voice recognition software and may include unintentional dictation errors.   Jene Every, MD 05/17/23 [phone]

## 2023-05-24 ENCOUNTER — Ambulatory Visit: Payer: Self-pay | Admitting: *Deleted

## 2023-05-24 ENCOUNTER — Encounter: Payer: Self-pay | Admitting: Internal Medicine

## 2023-05-24 ENCOUNTER — Ambulatory Visit (INDEPENDENT_AMBULATORY_CARE_PROVIDER_SITE_OTHER): Payer: Medicare HMO | Admitting: Internal Medicine

## 2023-05-24 ENCOUNTER — Telehealth: Payer: Self-pay

## 2023-05-24 VITALS — BP 122/68 | HR 96 | Temp 97.8°F | Resp 16 | Ht 62.0 in | Wt 108.0 lb

## 2023-05-24 DIAGNOSIS — R1032 Left lower quadrant pain: Secondary | ICD-10-CM

## 2023-05-24 DIAGNOSIS — K5900 Constipation, unspecified: Secondary | ICD-10-CM | POA: Diagnosis not present

## 2023-05-24 NOTE — Telephone Encounter (Signed)
Reason for Disposition  [1] SEVERE back pain (e.g., excruciating, unable to do any normal activities) AND [2] not improved 2 hours after pain medicine  Protocols used: Back Pain-A-AH

## 2023-05-24 NOTE — Telephone Encounter (Signed)
  Chief Complaint: Left flank pain radiating down both of her legs, urinary frequency, voiding very small amounts of urine. Symptoms: Seen in ED and dx with a kidney stone 05/17/2023.   Felt better while there on morphine but after getting home and morphine wore off she is in severe pain again.   The pain was on the right flank area however now it's on the left and going down both legs.  This morning the pain is more intense.   Frequency: Now Pertinent Negatives: Patient denies blood in urine.   Was not given a strainer to urinate through so unsure whether she has passed the stone or not. Disposition: [] ED /[] Urgent Care (no appt availability in office) / [x] Appointment(In office/virtual)/ []  [facility]/ [] Home Care/ [] Refused Recommended Disposition /[]  Mobile Bus/ []  Follow-up with PCP Additional Notes: Appt made for today with Dr. Caralee Ates at 3:40.      Instructions given to go to the ED if she becomes unable to void, blood returns in urine or the flank pain becomes worse.   She verbalized understanding though she mentioned,  "I don't really want to go back over there".    "It's so busy and the wait is so long".

## 2023-05-24 NOTE — Telephone Encounter (Signed)
Transition Care Management Follow-up Telephone Call Date of discharge and from where: [facility] 7/2 How have you been since you were released from the hospital? Still in pain and following up with pcp over the phone  Any questions or concerns? No  Items Reviewed: Did the pt receive and understand the discharge instructions provided? Yes  Medications obtained and verified? Yes  Other? No  Any new allergies since your discharge? No  Dietary orders reviewed? No Do you have support at home? Yes     Follow up appointments reviewed:  PCP Hospital f/u appt confirmed? Yes  Scheduled to see  on  @ . Specialist Hospital f/u appt confirmed? Yes  Scheduled to see  on  @ . Are transportation arrangements needed? No  If their condition worsens, is the pt aware to call PCP or go to the Emergency Dept.? Yes Was the patient provided with contact information for the PCP's office or ED? Yes Was to pt encouraged to call back with questions or concerns? Yes

## 2023-05-24 NOTE — Progress Notes (Signed)
Acute Office Visit  Subjective:     Patient ID: Miranda Stafford, female    DOB: 04/29/1956, 67 y.o.   MRN: [id number]  Chief Complaint  Patient presents with   Abdominal Pain    Left side pain    HPI Patient is in today for left flank pain. Woke up one week ago and felt like she was having labor pains with right sided flank pain. Went to the ER, labs normal, CT scan negative for stones or other abdominal pathology. Blood in urine at the time, no sign of infection. Used a heating pad, after a couple days right flank pain resolved. A few days later now having left sided flank/abdominal pain radiating in to the groin and into upper thigh. Took some Ibuprofen this morning which caused vomiting (she has known allergy). Last BM was this morning but it was a small amount.  She feels like she has not been emptying her bowels completely, endorses straining and passing dry hard stools.  Denies blood in the stools.  Denies fevers.  Review of Systems  Constitutional:  Negative for chills and fever.  Respiratory:  Negative for shortness of breath.   Cardiovascular:  Negative for chest pain.  Gastrointestinal:  Positive for abdominal pain and constipation. Negative for blood in stool, diarrhea, nausea and vomiting.  Genitourinary:  Positive for flank pain. Negative for dysuria, frequency and urgency.        Objective:    BP 122/68   Pulse 96   Temp 97.8 F (36.6 C)   Resp 16   Ht 5\' 2"  (1.575 m)   Wt 108 lb (49 kg)   SpO2 98%   BMI 19.75 kg/m  BP Readings from Last 3 Encounters:  05/24/23 122/68  05/17/23 120/70  04/08/23 114/66   Wt Readings from Last 3 Encounters:  05/24/23 108 lb (49 kg)  05/17/23 110 lb 0.2 oz (49.9 kg)  04/08/23 109 lb 14.4 oz (49.9 kg)      Physical Exam Constitutional:      Appearance: Normal appearance.  HENT:     Head: Normocephalic and atraumatic.  Eyes:     Conjunctiva/sclera: Conjunctivae normal.  Cardiovascular:     Rate and Rhythm: Normal  rate and regular rhythm.  Pulmonary:     Effort: Pulmonary effort is normal.     Breath sounds: Normal breath sounds.  Abdominal:     General: There is no distension.     Tenderness: There is abdominal tenderness. There is no right CVA tenderness, left CVA tenderness, guarding or rebound.     Comments: Hypoactive bowel sounds, hard stool palpated in left lower quadrant, severely tender to palpation  Neurological:     Mental Status: She is alert.     No results found for any visits on 05/24/23.      Assessment & Plan:   1. LLQ pain/ Constipation, unspecified constipation type: Reviewed ER note, labs and imaging from 05/17/2023.  I do not think her left flank/abdominal pain is due to a kidney stone.  There is no kidney stone seen on her CT scan, however I do think her pain is more consistent with constipation.  UA ordered today, however patient was unable to void to recheck urine.  At this point I recommend increasing hydration and fiber slowly as well starting prune juice and MiraLAX to help relieve constipation.  If this is unsuccessful she will let me know and we can prescribe something stronger.  Follow-up if symptoms worsen or fail  to improve.  - POCT Urinalysis Dipstick   Return if symptoms worsen or fail to improve.  Margarita Mail, DO

## 2023-05-25 NOTE — Progress Notes (Deleted)
Patient: Miranda Stafford, Female    DOB: [birth year], 67 y.o.   MRN: [id number]  Visit Date: 05/25/2023  Today's Provider: Ruel Favors, MD   Welcome to Medicare  Subjective:    HPI Miranda Stafford is a 67 y.o. female who presents today for her Subsequent Annual Wellness Visit.  Patient/Caregiver input:    Review of Systems  Constitutional: Negative for fever or weight change.  Respiratory: Negative for cough and shortness of breath.   Cardiovascular: Negative for chest pain or palpitations.  Gastrointestinal: Negative for abdominal pain, no bowel changes.  Musculoskeletal: Negative for gait problem or joint swelling.  Skin: Negative for rash.  Neurological: Negative for dizziness or headache.  No other specific complaints in a complete review of systems (except as listed in HPI above).  Past Medical History:  Diagnosis Date   Allergy    Anemia    Arthritis    Asthma    Complication of anesthesia    slow to wake   Fibroid tumor    Herpes zoster    Hyperlipidemia    Hypothyroidism    Insomnia    Osteopenia    Thyroid disease    Wears dentures    partial upper and lower    Past Surgical History:  Procedure Laterality Date   ABDOMINAL HYSTERECTOMY  2005   fibroid tumor   ADENOIDECTOMY     CESAREAN SECTION     ETHMOIDECTOMY Bilateral 02/28/2020   Procedure: TOTAL ETHMOIDECTOMY;  Surgeon: Geanie Logan, MD;  Location: [facility];  Service: ENT;  Laterality: Bilateral;   FRONTAL SINUS EXPLORATION Bilateral 02/28/2020   Procedure: FRONTAL SINUS EXPLORATION;  Surgeon: Geanie Logan, MD;  Location: [facility];  Service: ENT;  Laterality: Bilateral;   IMAGE GUIDED SINUS SURGERY N/A 02/28/2020   Procedure: IMAGE GUIDED SINUS SURGERY;  Surgeon: Geanie Logan, MD;  Location: [facility];  Service: ENT;  Laterality: N/A;   MAXILLARY ANTROSTOMY Bilateral 02/28/2020   Procedure: MAXILLARY ANTROSTOMY WITH TISSUE REMOVAL;  Surgeon: Geanie Logan, MD;   Location: [facility];  Service: ENT;  Laterality: Bilateral;   SPHENOIDECTOMY Bilateral 02/28/2020   Procedure: Selina Cooley;  Surgeon: Geanie Logan, MD;  Location: [facility];  Service: ENT;  Laterality: Bilateral;   TUBAL LIGATION      Family History  Problem Relation Age of Onset   Heart disease Mother    Hypertension Mother    Hyperlipidemia Mother    SIDS Son    Breast cancer Sister 7   Hypothyroidism Sister    Hypertension Sister     Social History   Socioeconomic History   Marital status: Married    Spouse name: Therapist, nutritional   Number of children: 2   Years of education: Not on file   Highest education level: Professional school degree (e.g., MD, DDS, DVM, JD)  Occupational History   Occupation: Retired Runner, broadcasting/film/video  Tobacco Use   Smoking status: Former    Packs/day: 0.50    Years: 5.00    Additional pack years: 0.00    Total pack years: 2.50    Types: Cigarettes    Start date: 11/16/1979    Quit date: 09/18/1985    Years since quitting: 37.7   Smokeless tobacco: Never  Vaping Use   Vaping Use: Never used  Substance and Sexual Activity   Alcohol use: Yes    Alcohol/week: 0.0 standard drinks of alcohol    Comment: wine/socially/rarely   Drug use: No   Sexual activity: Yes  Partners: Male  Other Topics Concern   Not on file  Social History Narrative   Not on file   Social Determinants of Health   Financial Resource Strain: Low Risk  (12/18/2018)   Overall Financial Resource Strain (CARDIA)    Difficulty of Paying Living Expenses: Not hard at all  Food Insecurity: No Food Insecurity (12/18/2018)   Hunger Vital Sign    Worried About Running Out of Food in the Last Year: Never true    Ran Out of Food in the Last Year: Never true  Transportation Needs: No Transportation Needs (12/18/2018)   PRAPARE - Administrator, Civil Service (Medical): No    Lack of Transportation (Non-Medical): No  Physical Activity: Insufficiently Active (07/18/2019)    Exercise Vital Sign    Days of Exercise per Week: 5 days    Minutes of Exercise per Session: 20 min  Stress: No Stress Concern Present (12/18/2018)   [employer] of Occupational Health - Occupational Stress Questionnaire    Feeling of Stress : Not at all  Social Connections: Moderately Integrated (12/18/2018)   Social Connection and Isolation Panel [NHANES]    Frequency of Communication with Friends and Family: More than three times a week    Frequency of Social Gatherings with Friends and Family: Three times a week    Attends Religious Services: More than 4 times per year    Active Member of Clubs or Organizations: No    Attends Banker Meetings: Never    Marital Status: Married  Catering manager Violence: Not At Risk (12/18/2018)   Humiliation, Afraid, Rape, and Kick questionnaire    Fear of Current or Ex-Partner: No    Emotionally Abused: No    Physically Abused: No    Sexually Abused: No    Outpatient Encounter Medications as of 05/26/2023  Medication Sig   albuterol (VENTOLIN HFA) 108 (90 Base) MCG/ACT inhaler Inhale 2 puffs into the lungs every 4 (four) hours as needed for wheezing or shortness of breath.   Cholecalciferol (VITAMIN D3) 50 MCG (2000 UT) capsule Take 1 capsule (2,000 Units total) by mouth daily.   fluticasone furoate-vilanterol (BREO ELLIPTA) 100-25 MCG/ACT AEPB Inhale 1 puff into the lungs daily.   loratadine (CLARITIN) 10 MG tablet Take 10 mg by mouth daily.   montelukast (SINGULAIR) 10 MG tablet Take 1 tablet (10 mg total) by mouth at bedtime.   SYNTHROID 88 MCG tablet Take 1 tablet (88 mcg total) by mouth daily before breakfast on  M,T,W,T and take half tab (44 mcg) poqam on Friday, no dose on sat/sun   No facility-administered encounter medications on file as of 05/26/2023.    Allergies  Allergen Reactions   Aspirin Nausea And Vomiting   Nsaids     Nausea and vomiting     Care Team Updated in EHR: {Yes/No:30480221}  Last Vision Exam:  *** Wears corrective lenses: {Yes/No:30480221} Last Dental Exam: *** Last Hearing Exam: *** Wears Hearing Aids: {Yes/No:30480221}  Functional Ability / Safety Screening 1.  Was the timed Get Up and Go test shorter than 30 seconds?  {Blank single:19197::"yes","no"} 2.  Does the patient need help with the phone, transportation, shopping,      preparing meals, housework, laundry, medications, or managing money?  {Blank single:19197::"yes","no"} 3.  Is the patient's home free of loose throw rugs in walkways, pet beds, electrical cords, etc?   {Blank single:19197::"yes","no"}      Grab bars in the bathroom? {Blank single:19197::"yes","no"}  Handrails on the stairs?   {Blank single:19197::"yes","no"}      Adequate lighting?   {Blank single:19197::"yes","no"} 4.  Has the patient noticed any hearing difficulties?   {Blank single:19197::"yes","no"}  Diet Recall and Exercise Regimen: ***  Advanced Care Planning: A voluntary discussion about advance care planning including the explanation and discussion of advance directives.  Discussed health care proxy and Living will, and the patient was able to identify a health care proxy as ***.  Patient {DOES_DOES ZOX:09604} have a living will at present time. If patient does have living will, I have requested they bring this to the clinic to be scanned in to their chart. Does patient have a HCPOA?    {Blank single:19197::"yes","no"} If yes, name and contact information:  Does patient have a living will or MOST form?  {Blank single:19197::"yes","no"}  Cancer Screenings: Skin: *** Lung: Low Dose CT Chest recommended if Age 33-80 years, 30 pack-year currently smoking OR have quit w/in 15years. Patient does not qualify. Breast: Up to date on Mammogram? Yes  Up to date of Bone Density/Dexa? Scheduled for 07/07/23 Colon: Colonoscopy 06/25/14  Additional Screenings: Hepatitis B/HIV/Syphillis: 01/10/18 Hepatitis C Screening: 01/18/13 Intimate Partner Violence:  Negative  Objective:   Vitals: There were no vitals taken for this visit. There is no height or weight on file to calculate BMI.  No results found.  Physical Exam Constitutional: Patient appears well-developed and well-nourished. Obese *** No distress.  HEENT: head atraumatic, normocephalic, pupils equal and reactive to light, ears ***, neck supple, throat within normal limits Cardiovascular: Normal rate, regular rhythm and normal heart sounds.  No murmur heard. No BLE edema. Pulmonary/Chest: Effort normal and breath sounds normal. No respiratory distress. Abdominal: Soft.  There is no tenderness. Psychiatric: Patient has a normal mood and affect. behavior is normal. Judgment and thought content normal.  Cognitive Testing - 6-CIT  Correct? Score   What year is it? {YES NO:22349} {Numbers; 0-4:31231} Yes = 0    No = 4  What month is it? {YES NO:22349} {Numbers; 0-4:31231} Yes = 0    No = 3  Remember:     Floyde Parkins, [address], [place]     What time is it? {YES NO:22349} {Numbers; 0-4:31231} Yes = 0    No = 3  Count backwards from 20 to 1 {YES NO:22349} {Numbers; 0-4:31231} Correct = 0    1 error = 2   More than 1 error = 4  Say the months of the year in reverse. {YES NO:22349} {Numbers; 0-4:31231} Correct = 0    1 error = 2   More than 1 error = 4  What address did I ask you to remember? {YES NO:22349} {NUMBERS; 0-10:5044} Correct = 0  1 error = 2    2 error = 4    3 error = 6    4 error = 8    All wrong = 10       TOTAL SCORE  {Numbers; 5-40:98119}/14   Interpretation:  {Desc; normal/abnormal:11317::"Normal"}  Normal (0-7) Abnormal (8-28)   Fall Risk:    05/24/2023    3:34 PM 04/08/2023    2:00 PM 04/01/2023    1:17 PM 12/29/2022    3:14 PM 08/16/2022   10:35 AM  Fall Risk   Falls in the past year? 0 0 0 0 0  Number falls in past yr: 0  0 0 0  Injury with Fall? 0  0 0 0  Risk for fall due to :  No Fall Risks No Fall Risks No Fall Risks No Fall Risks  Follow up  Falls  prevention discussed Falls prevention discussed;Education provided;Falls evaluation completed Falls prevention discussed Falls prevention discussed    Depression Screen    05/24/2023    3:34 PM 04/08/2023    2:00 PM 04/01/2023    1:17 PM 12/29/2022    3:14 PM 08/16/2022   10:35 AM  Depression screen PHQ 2/9  Decreased Interest 0 0 0 0 0  Down, Depressed, Hopeless 0 0 0 0 0  PHQ - 2 Score 0 0 0 0 0  Altered sleeping 0 0 0 1 0  Tired, decreased energy 0 0 0 0 1  Change in appetite 0 0 0 0 0  Feeling bad or failure about yourself  0 0 0 0 0  Trouble concentrating 0 0 0 0 0  Moving slowly or fidgety/restless 0 0 0 0 0  Suicidal thoughts 0 0 0 0 0  PHQ-9 Score 0 0 0 1 1  Difficult doing work/chores Not difficult at all  Not difficult at all      Recent Results (from the past 2160 hour(s))  CBC with Differential/Platelet     Status: Abnormal   Collection Time: 04/01/23  1:54 PM  Result Value Ref Range   WBC 3.9 3.8 - 10.8 Thousand/uL   RBC 4.12 3.80 - 5.10 Million/uL   Hemoglobin 10.6 (L) 11.7 - 15.5 g/dL   HCT 60.4 (L) 54.0 - 98.1 %   MCV 82.3 80.0 - 100.0 fL   MCH 25.7 (L) 27.0 - 33.0 pg   MCHC 31.3 (L) 32.0 - 36.0 g/dL   RDW 19.1 47.8 - 29.5 %   Platelets 199 140 - 400 Thousand/uL   MPV 10.8 7.5 - 12.5 fL   Neutro Abs 1,346 (L) 1,500 - 7,800 cells/uL   Lymphs Abs 2,020 850 - 3,900 cells/uL   Absolute Monocytes 168 (L) 200 - 950 cells/uL   Eosinophils Absolute 347 15 - 500 cells/uL   Basophils Absolute 20 0 - 200 cells/uL   Neutrophils Relative % 34.5 %   Total Lymphocyte 51.8 %   Monocytes Relative 4.3 %   Eosinophils Relative 8.9 %   Basophils Relative 0.5 %  COMPLETE METABOLIC PANEL WITH GFR     Status: None   Collection Time: 04/01/23  1:54 PM  Result Value Ref Range   Glucose, Bld 80 65 - 99 mg/dL    Comment: .            Fasting reference interval .    BUN 10 7 - 25 mg/dL   Creat 6.21 3.08 - 6.57 mg/dL   eGFR 70 > OR = 60 QI/ONG/2.95M8   BUN/Creatinine Ratio  SEE NOTE: 6 - 22 (calc)    Comment:    Not Reported: BUN and Creatinine are within    reference range. .    Sodium 141 135 - 146 mmol/L   Potassium 4.3 3.5 - 5.3 mmol/L   Chloride 107 98 - 110 mmol/L   CO2 26 20 - 32 mmol/L   Calcium 9.1 8.6 - 10.4 mg/dL   Total Protein 6.8 6.1 - 8.1 g/dL   Albumin 4.1 3.6 - 5.1 g/dL   Globulin 2.7 1.9 - 3.7 g/dL (calc)   AG Ratio 1.5 1.0 - 2.5 (calc)   Total Bilirubin 0.2 0.2 - 1.2 mg/dL   Alkaline phosphatase (APISO) 63 37 - 153 U/L   AST 35 10 - 35 U/L  ALT 22 6 - 29 U/L  TSH     Status: Abnormal   Collection Time: 04/01/23  1:54 PM  Result Value Ref Range   TSH 0.14 (L) 0.40 - 4.50 mIU/L  VITAMIN D 25 Hydroxy (Vit-D Deficiency, Fractures)     Status: Abnormal   Collection Time: 04/01/23  1:54 PM  Result Value Ref Range   Vit D, 25-Hydroxy 28 (L) 30 - 100 ng/mL    Comment: Vitamin D Status         25-OH Vitamin D: . Deficiency:                    <20 ng/mL Insufficiency:             20 - 29 ng/mL Optimal:                 > or = 30 ng/mL . For 25-OH Vitamin D testing on patients on  D2-supplementation and patients for whom quantitation  of D2 and D3 fractions is required, the [facility](TM) 25-OH VIT D, (D2,D3), LC/MS/MS is recommended: order  code 57846 (patients >59yrs). . See Note 1 . Note 1 . For additional information, please refer to  http://education.[facility].com/faq/FAQ199  (This link is being provided for informational/ educational purposes only.)   Vitamin B12     Status: None   Collection Time: 04/01/23  1:54 PM  Result Value Ref Range   Vitamin B-12 354 200 - 1,100 pg/mL    Comment: . Please Note: Although the reference range for vitamin B12 is [phone] pg/mL, it has been reported that between 5 and 10% of patients with values between 200 and 400 pg/mL may experience neuropsychiatric and hematologic abnormalities due to occult B12 deficiency; less than 1% of patients with values above 400 pg/mL will  have symptoms. .   Urinalysis, Routine w reflex microscopic -Urine, Clean Catch     Status: Abnormal   Collection Time: 05/17/23  2:02 PM  Result Value Ref Range   Color, Urine YELLOW (A) YELLOW   APPearance CLEAR (A) CLEAR   Specific Gravity, Urine 1.010 1.005 - 1.030   pH 7.0 5.0 - 8.0   Glucose, UA NEGATIVE NEGATIVE mg/dL   Hgb urine dipstick SMALL (A) NEGATIVE   Bilirubin Urine NEGATIVE NEGATIVE   Ketones, ur NEGATIVE NEGATIVE mg/dL   Protein, ur NEGATIVE NEGATIVE mg/dL   Nitrite NEGATIVE NEGATIVE   Leukocytes,Ua NEGATIVE NEGATIVE   RBC / HPF 0-5 0 - 5 RBC/hpf   WBC, UA 0-5 0 - 5 WBC/hpf   Bacteria, UA NONE SEEN NONE SEEN   Squamous Epithelial / HPF 0-5 0 - 5 /HPF   Mucus PRESENT     Comment: Performed at [facility], [address] [place], [place] [address]  CBC     Status: Abnormal   Collection Time: 05/17/23  3:40 PM  Result Value Ref Range   WBC 3.3 (L) 4.0 - 10.5 K/uL   RBC 3.89 3.87 - 5.11 MIL/uL   Hemoglobin 9.9 (L) 12.0 - 15.0 g/dL   HCT 28.4 (L) 13.2 - 44.0 %   MCV 80.2 80.0 - 100.0 fL   MCH 25.4 (L) 26.0 - 34.0 pg   MCHC 31.7 30.0 - 36.0 g/dL   RDW 10.2 72.5 - 36.6 %   Platelets 187 150 - 400 K/uL   nRBC 0.0 0.0 - 0.2 %    Comment: Performed at [facility], [address], [place], [place] [address]  Comprehensive metabolic panel  Status: Abnormal   Collection Time: 05/17/23  3:40 PM  Result Value Ref Range   Sodium 136 135 - 145 mmol/L   Potassium 4.2 3.5 - 5.1 mmol/L   Chloride 105 98 - 111 mmol/L   CO2 23 22 - 32 mmol/L   Glucose, Bld 99 70 - 99 mg/dL    Comment: Glucose reference range applies only to samples taken after fasting for at least 8 hours.   BUN 12 8 - 23 mg/dL   Creatinine, Ser 1.61 0.44 - 1.00 mg/dL   Calcium 8.8 (L) 8.9 - 10.3 mg/dL   Total Protein 6.8 6.5 - 8.1 g/dL   Albumin 3.8 3.5 - 5.0 g/dL   AST 25 15 - 41 U/L   ALT 14 0 - 44 U/L   Alkaline Phosphatase 51 38 - 126 U/L   Total Bilirubin 0.6 0.3 - 1.2  mg/dL   GFR, Estimated >09 >60 mL/min    Comment: (NOTE) Calculated using the CKD-EPI Creatinine Equation (2021)    Anion gap 8 5 - 15    Comment: Performed at [facility], [address] [place], [place] [address]  Lipase, blood     Status: None   Collection Time: 05/17/23  3:40 PM  Result Value Ref Range   Lipase 32 11 - 51 U/L    Comment: Performed at [facility], [address], [place], [place] [address]    Assessment & Plan:    1. Welcome to Medicare preventive visit ***  *** type dotphrase "dot"diagmed to refresh this list  Exercise Activities and Dietary recommendations  Goals   None    - Discussed health benefits of physical activity, and encouraged her to engage in regular exercise appropriate for her age and condition.   Immunization History  Administered Date(s) Administered   PFIZER(Purple Top)SARS-COV-2 Vaccination 01/10/2020, 02/05/2020   Pfizer Covid-19 Vaccine Bivalent Booster 6yrs & up 08/20/2020   Tdap 07/03/2012    Health Maintenance  Topic Date Due   DEXA SCAN  Never done   Zoster Vaccines- Shingrix (1 of 2) 05/25/2023 (Originally 03/28/1975)   COVID-19 Vaccine (4 - 2023-24 season) 06/09/2023 (Originally 07/16/2022)   Pneumonia Vaccine 42+ Years old (1 of 1 - PCV) 04/07/2024 (Originally 03/29/2023)   Medicare Annual Wellness (AWV)  05/25/2024   Colonoscopy  06/25/2024   MAMMOGRAM  11/18/2024   Hepatitis C Screening  Completed   HIV Screening  Completed   HPV VACCINES  Aged Out   DTaP/Tdap/Td  Discontinued   INFLUENZA VACCINE  Discontinued    No orders of the defined types were placed in this encounter.   Current Outpatient Medications:    albuterol (VENTOLIN HFA) 108 (90 Base) MCG/ACT inhaler, Inhale 2 puffs into the lungs every 4 (four) hours as needed for wheezing or shortness of breath., Disp: 18 g, Rfl: 0   Cholecalciferol (VITAMIN D3) 50 MCG (2000 UT) capsule, Take 1 capsule (2,000 Units total) by mouth daily., Disp:  100 capsule, Rfl: 1   fluticasone furoate-vilanterol (BREO ELLIPTA) 100-25 MCG/ACT AEPB, Inhale 1 puff into the lungs daily., Disp: 1 each, Rfl: 5   loratadine (CLARITIN) 10 MG tablet, Take 10 mg by mouth daily., Disp: , Rfl:    montelukast (SINGULAIR) 10 MG tablet, Take 1 tablet (10 mg total) by mouth at bedtime., Disp: 90 tablet, Rfl: 1   SYNTHROID 88 MCG tablet, Take 1 tablet (88 mcg total) by mouth daily before breakfast on  M,T,W,T and take half tab (44 mcg) poqam  on Friday, no dose on sat/sun, Disp: 54 tablet, Rfl: 0 There are no discontinued medications.  I have personally reviewed and addressed the Medicare Annual Wellness health risk assessment questionnaire and have noted the following in the patient's chart:  A.         Medical and social history & family history B.         Use of alcohol, tobacco, and illicit drugs  C.         Current medications and supplements D.         Functional and Cognitive ability and status E.         Nutritional status F.         Physical activity G.        Advance directives H.         List of other physicians I.          Hospitalizations, surgeries, and ER visits in previous 12 months J.         Vitals K.         Screenings such as hearing, vision, cognitive function, and depression L.         Referrals and appointments: ***  In addition, I have reviewed and discussed with patient certain preventive protocols, quality metrics, and best practice recommendations. A written personalized care plan for preventive services as well as general preventive health recommendations were provided to patient.   See attached scanned questionnaire for additional information.   No follow-ups on file. *** refresh to show

## 2023-05-26 ENCOUNTER — Ambulatory Visit: Payer: Self-pay

## 2023-05-26 ENCOUNTER — Encounter: Payer: BC Managed Care – PPO | Admitting: Family Medicine

## 2023-05-26 DIAGNOSIS — Z0000 Encounter for general adult medical examination without abnormal findings: Secondary | ICD-10-CM

## 2023-05-26 NOTE — Telephone Encounter (Signed)
  Chief Complaint: Diarrhea Symptoms:  Frequency:  Pertinent Negatives: Patient denies  Disposition: [] ED /[] Urgent Care (no appt availability in office) / [] Appointment(In office/virtual)/ []  [facility]/ [] Home Care/ [] Refused Recommended Disposition /[] [facility]/ [x]  Follow-up with PCP Additional Notes: Pt was seen for constipation and was told to try miralax and prune juice. Constipation has resolved. Pt is now having diarrhea. Pt took Miralax yesterday with prune juice. Pt has more prune juice this morning. Pt will stop constipation therapy. Pt states that diarrhea was expected, and she is fine. Pt refused triage. Pt also has a runny nose.  Summary: diarrhea   Pt stated she was seen in the office on 07/09. Dr. Caralee Ates suggested she take prune juice, and MiraLAX stated she has diarrhea. Asked that I let the nurse know for a callback. Denied abdominal pain.     Seeking clinical advice.     Reason for Disposition  [1] Other NON-URGENT information for PCP AND [2] does not require PCP response  Answer Assessment - Initial Assessment Questions 1. REASON FOR CALL or QUESTION: "What is your reason for calling today?" or "How can I best help you?" or "What question do you have that I can help answer?"     Pt called to reschedule OV. 2. CALLER: Document the source of call. (e.g., laboratory, patient).     Pt  Protocols used: PCP Call - No Triage-A-AH

## 2023-05-27 NOTE — Progress Notes (Signed)
Patient: Miranda Stafford, Female    DOB: [birth year], 67 y.o.   MRN: [id number]  Visit Date: 05/30/2023  Today's Provider: Ruel Favors, MD   Welcome to Medicare  Subjective:    HPI Iantha Titsworth is a 67 y.o. female who presents today for her Subsequent Annual Wellness Visit.  Patient/Caregiver input:    Review of Systems  Constitutional: Negative for fever or weight change.  Respiratory: Negative for cough and shortness of breath.   Cardiovascular: Negative for chest pain or palpitations.  Gastrointestinal: Negative for abdominal pain, no bowel changes.  Musculoskeletal: Negative for gait problem or joint swelling.  Skin: Negative for rash.  Neurological: Negative for dizziness or headache.  No other specific complaints in a complete review of systems (except as listed in HPI above).  Past Medical History:  Diagnosis Date   Allergy    Anemia    Arthritis    Asthma    Complication of anesthesia    slow to wake   Fibroid tumor    Herpes zoster    Hyperlipidemia    Hypothyroidism    Insomnia    Osteopenia    Thyroid disease    Wears dentures    partial upper and lower    Past Surgical History:  Procedure Laterality Date   ABDOMINAL HYSTERECTOMY  2005   fibroid tumor   ADENOIDECTOMY     CESAREAN SECTION     ETHMOIDECTOMY Bilateral 02/28/2020   Procedure: TOTAL ETHMOIDECTOMY;  Surgeon: Geanie Logan, MD;  Location: [facility];  Service: ENT;  Laterality: Bilateral;   FRONTAL SINUS EXPLORATION Bilateral 02/28/2020   Procedure: FRONTAL SINUS EXPLORATION;  Surgeon: Geanie Logan, MD;  Location: [facility];  Service: ENT;  Laterality: Bilateral;   IMAGE GUIDED SINUS SURGERY N/A 02/28/2020   Procedure: IMAGE GUIDED SINUS SURGERY;  Surgeon: Geanie Logan, MD;  Location: [facility];  Service: ENT;  Laterality: N/A;   MAXILLARY ANTROSTOMY Bilateral 02/28/2020   Procedure: MAXILLARY ANTROSTOMY WITH TISSUE REMOVAL;  Surgeon: Geanie Logan, MD;   Location: [facility];  Service: ENT;  Laterality: Bilateral;   SPHENOIDECTOMY Bilateral 02/28/2020   Procedure: Selina Cooley;  Surgeon: Geanie Logan, MD;  Location: [facility];  Service: ENT;  Laterality: Bilateral;   TUBAL LIGATION      Family History  Problem Relation Age of Onset   Heart disease Mother    Hypertension Mother    Hyperlipidemia Mother    SIDS Son    Breast cancer Sister 51   Hypothyroidism Sister    Hypertension Sister     Social History   Socioeconomic History   Marital status: Married    Spouse name: Therapist, nutritional   Number of children: 2   Years of education: Not on file   Highest education level: Professional school degree (e.g., MD, DDS, DVM, JD)  Occupational History   Occupation: Retired Runner, broadcasting/film/video  Tobacco Use   Smoking status: Former    Current packs/day: 0.00    Average packs/day: 0.5 packs/day for 5.8 years (2.9 ttl pk-yrs)    Types: Cigarettes    Start date: 11/16/1979    Quit date: 09/18/1985    Years since quitting: 37.7   Smokeless tobacco: Never  Vaping Use   Vaping status: Never Used  Substance and Sexual Activity   Alcohol use: Yes    Alcohol/week: 0.0 standard drinks of alcohol    Comment: wine/socially/rarely   Drug use: No   Sexual activity: Yes    Partners: Male  Other Topics  Concern   Not on file  Social History Narrative   Not on file   Social Determinants of Health   Financial Resource Strain: Low Risk  (05/30/2023)   Overall Financial Resource Strain (CARDIA)    Difficulty of Paying Living Expenses: Not hard at all  Food Insecurity: No Food Insecurity (05/30/2023)   Hunger Vital Sign    Worried About Running Out of Food in the Last Year: Never true    Ran Out of Food in the Last Year: Never true  Transportation Needs: No Transportation Needs (05/30/2023)   PRAPARE - Administrator, Civil Service (Medical): No    Lack of Transportation (Non-Medical): No  Physical Activity: Sufficiently Active  (05/30/2023)   Exercise Vital Sign    Days of Exercise per Week: 7 days    Minutes of Exercise per Session: 30 min  Stress: No Stress Concern Present (05/30/2023)   [employer] of Occupational Health - Occupational Stress Questionnaire    Feeling of Stress : Not at all  Social Connections: Socially Integrated (05/30/2023)   Social Connection and Isolation Panel [NHANES]    Frequency of Communication with Friends and Family: Three times a week    Frequency of Social Gatherings with Friends and Family: More than three times a week    Attends Religious Services: More than 4 times per year    Active Member of [employer] or Organizations: Yes    Attends Engineer, structural: More than 4 times per year    Marital Status: Married  Catering manager Violence: Not At Risk (05/30/2023)   Humiliation, Afraid, Rape, and Kick questionnaire    Fear of Current or Ex-Partner: No    Emotionally Abused: No    Physically Abused: No    Sexually Abused: No    Outpatient Encounter Medications as of 05/30/2023  Medication Sig   albuterol (VENTOLIN HFA) 108 (90 Base) MCG/ACT inhaler Inhale 2 puffs into the lungs every 4 (four) hours as needed for wheezing or shortness of breath.   Cholecalciferol (VITAMIN D3) 50 MCG (2000 UT) capsule Take 1 capsule (2,000 Units total) by mouth daily.   fluticasone furoate-vilanterol (BREO ELLIPTA) 100-25 MCG/ACT AEPB Inhale 1 puff into the lungs daily.   loratadine (CLARITIN) 10 MG tablet Take 10 mg by mouth daily.   montelukast (SINGULAIR) 10 MG tablet Take 1 tablet (10 mg total) by mouth at bedtime.   SYNTHROID 88 MCG tablet Take 1 tablet (88 mcg total) by mouth daily before breakfast on  M,T,W,T and take half tab (44 mcg) poqam on Friday, no dose on sat/sun   No facility-administered encounter medications on file as of 05/30/2023.    Allergies  Allergen Reactions   Aspirin Nausea And Vomiting   Nsaids     Nausea and vomiting     Care Team Updated in EHR:  Yes  Last Vision Exam: 2021 Wears corrective lenses: Yes advised her to go back  Last Dental Exam: Last week Last Hearing Exam: Unsure Wears Hearing Aids: No  Functional Ability / Safety Screening 1.  Was the timed Get Up and Go test shorter than 30 seconds?  yes 2.  Does the patient need help with the phone, transportation, shopping,      preparing meals, housework, laundry, medications, or managing money?  no 3.  Is the patient's home free of loose throw rugs in walkways, pet beds, electrical cords, etc?   yes      Grab bars in the bathroom? no  Handrails on the stairs?   yes      Adequate lighting?   yes 4.  Has the patient noticed any hearing difficulties?   no  Diet Recall and Exercise Regimen: she has been walking since the beginning of the Summer and eliminated sugar from her diet. She has added protein shakes to her diet since she does not eat red meat  Advanced Care Planning: A voluntary discussion about advance care planning including the explanation and discussion of advance directives.  Discussed health care proxy and Living will, and the patient was able to identify a health care proxy as husband.  Patient does have a living will at present time. If patient does have living will, I have requested they bring this to the clinic to be scanned in to their chart. Does patient have a HCPOA?    no If yes, name and contact information: N/A Does patient have a living will or MOST form?  yes  Cancer Screenings: Skin: no problems  Lung: Low Dose CT Chest recommended if Age 67-80 years, 30 pack-year currently smoking OR have quit w/in 15years. Patient does not qualify. Breast: Up to date on Mammogram? Yes  Up to date of Bone Density/Dexa? Scheduled for 07/07/23 Colon: Colonoscopy 06/25/14 - referral placed   Additional Screenings: Hepatitis B/HIV/Syphillis: 01/10/18 Hepatitis C Screening: 01/18/13 Intimate Partner Violence: Negative  Objective:   Vitals: BP 114/66   Pulse 75    Resp 16   Ht 5\' 2"  (1.575 m)   Wt 108 lb (49 kg)   BMI 19.75 kg/m  Body mass index is 19.75 kg/m.  No results found.  Physical Exam Constitutional: Patient appears well-developed and well-nourished.  No distress.  HEENT: head atraumatic, normocephalic, pupils equal and reactive to light, neck supple, throat within normal limits Cardiovascular: Normal rate, regular rhythm and normal heart sounds.  No murmur heard. No BLE edema. Pulmonary/Chest: Effort normal and breath sounds normal. No respiratory distress. Abdominal: Soft.  There is no tenderness. Psychiatric: Patient has a normal mood and affect. behavior is normal. Judgment and thought content normal.  Cognitive Testing - 6-CIT  Correct? Score   What year is it? yes 0 Yes = 0    No = 4  What month is it? yes 0 Yes = 0    No = 3  Remember:     Floyde Parkins, [address], [place]     What time is it? yes 0 Yes = 0    No = 3  Count backwards from 20 to 1 yes 0 Correct = 0    1 error = 2   More than 1 error = 4  Say the months of the year in reverse. yes 0 Correct = 0    1 error = 2   More than 1 error = 4  What address did I ask you to remember? yes 0 Correct = 0  1 error = 2    2 error = 4    3 error = 6    4 error = 8    All wrong = 10       TOTAL SCORE  0/28   Interpretation:  Normal  Normal (0-7) Abnormal (8-28)   Fall Risk:    05/30/2023    3:22 PM 05/24/2023    3:34 PM 04/08/2023    2:00 PM 04/01/2023    1:17 PM 12/29/2022    3:14 PM  Fall Risk   Falls in the past year?  0 0 0 0 0  Number falls in past yr: 0 0  0 0  Injury with Fall? 0 0  0 0  Risk for fall due to : No Fall Risks  No Fall Risks No Fall Risks No Fall Risks  Follow up Falls prevention discussed  Falls prevention discussed Falls prevention discussed;Education provided;Falls evaluation completed Falls prevention discussed    Depression Screen    05/30/2023    3:22 PM 05/24/2023    3:34 PM 04/08/2023    2:00 PM 04/01/2023    1:17 PM 12/29/2022    3:14 PM   Depression screen PHQ 2/9  Decreased Interest 0 0 0 0 0  Down, Depressed, Hopeless 0 0 0 0 0  PHQ - 2 Score 0 0 0 0 0  Altered sleeping 0 0 0 0 1  Tired, decreased energy 0 0 0 0 0  Change in appetite 0 0 0 0 0  Feeling bad or failure about yourself  0 0 0 0 0  Trouble concentrating 0 0 0 0 0  Moving slowly or fidgety/restless 0 0 0 0 0  Suicidal thoughts 0 0 0 0 0  PHQ-9 Score 0 0 0 0 1  Difficult doing work/chores  Not difficult at all  Not difficult at all      Assessment & Plan:    1. Welcome to Medicare preventive visit    type dotphrase "dot"diagmed to refresh this list  Exercise Activities and Dietary recommendations  She will continue regular physical activity and changed her diet to be healthy   - Discussed health benefits of physical activity, and encouraged her to engage in regular exercise appropriate for her age and condition.   Immunization History  Administered Date(s) Administered   PFIZER(Purple Top)SARS-COV-2 Vaccination 01/10/2020, 02/05/2020   Pfizer Covid-19 Vaccine Bivalent Booster 17yrs & up 08/20/2020   Tdap 07/03/2012    Health Maintenance  Topic Date Due   DEXA SCAN  Never done   COVID-19 Vaccine (4 - 2023-24 season) 06/09/2023 (Originally 07/16/2022)   Zoster Vaccines- Shingrix (1 of 2) 08/27/2023 (Originally [birth year]/1977)   Pneumonia Vaccine 55+ Years old (1 of 1 - PCV) 04/07/2024 (Originally 03/29/2023)   Medicare Annual Wellness (AWV)  05/29/2024   Colonoscopy  06/25/2024   MAMMOGRAM  11/18/2024   Hepatitis C Screening  Completed   HIV Screening  Completed   HPV VACCINES  Aged Out   DTaP/Tdap/Td  Discontinued   INFLUENZA VACCINE  Discontinued    No orders of the defined types were placed in this encounter.   Current Outpatient Medications:    albuterol (VENTOLIN HFA) 108 (90 Base) MCG/ACT inhaler, Inhale 2 puffs into the lungs every 4 (four) hours as needed for wheezing or shortness of breath., Disp: 18 g, Rfl: 0   Cholecalciferol  (VITAMIN D3) 50 MCG (2000 UT) capsule, Take 1 capsule (2,000 Units total) by mouth daily., Disp: 100 capsule, Rfl: 1   fluticasone furoate-vilanterol (BREO ELLIPTA) 100-25 MCG/ACT AEPB, Inhale 1 puff into the lungs daily., Disp: 1 each, Rfl: 5   loratadine (CLARITIN) 10 MG tablet, Take 10 mg by mouth daily., Disp: , Rfl:    montelukast (SINGULAIR) 10 MG tablet, Take 1 tablet (10 mg total) by mouth at bedtime., Disp: 90 tablet, Rfl: 1   SYNTHROID 88 MCG tablet, Take 1 tablet (88 mcg total) by mouth daily before breakfast on  M,T,W,T and take half tab (44 mcg) poqam on Friday, no dose on sat/sun, Disp: 54 tablet, Rfl: 0  There are no discontinued medications.  I have personally reviewed and addressed the Medicare Annual Wellness health risk assessment questionnaire and have noted the following in the patient's chart:  A.         Medical and social history & family history B.         Use of alcohol, tobacco, and illicit drugs  C.         Current medications and supplements D.         Functional and Cognitive ability and status E.         Nutritional status F.         Physical activity G.        Advance directives H.         List of other physicians I.          Hospitalizations, surgeries, and ER visits in previous 12 months J.         Vitals K.         Screenings such as hearing, vision, cognitive function, and depression L.         Referrals and appointments:   In addition, I have reviewed and discussed with patient certain preventive protocols, quality metrics, and best practice recommendations. A written personalized care plan for preventive services as well as general preventive health recommendations were provided to patient.   See attached scanned questionnaire for additional information.   No follow-ups on file.

## 2023-05-30 ENCOUNTER — Encounter: Payer: Self-pay | Admitting: Family Medicine

## 2023-05-30 ENCOUNTER — Ambulatory Visit (INDEPENDENT_AMBULATORY_CARE_PROVIDER_SITE_OTHER): Payer: Medicare HMO | Admitting: Family Medicine

## 2023-05-30 VITALS — BP 114/66 | HR 75 | Resp 16 | Ht 62.0 in | Wt 108.0 lb

## 2023-05-30 DIAGNOSIS — E538 Deficiency of other specified B group vitamins: Secondary | ICD-10-CM | POA: Diagnosis not present

## 2023-05-30 DIAGNOSIS — E039 Hypothyroidism, unspecified: Secondary | ICD-10-CM

## 2023-05-30 DIAGNOSIS — Z0000 Encounter for general adult medical examination without abnormal findings: Secondary | ICD-10-CM | POA: Diagnosis not present

## 2023-05-30 DIAGNOSIS — R739 Hyperglycemia, unspecified: Secondary | ICD-10-CM

## 2023-05-30 DIAGNOSIS — D708 Other neutropenia: Secondary | ICD-10-CM | POA: Diagnosis not present

## 2023-05-30 DIAGNOSIS — Z1211 Encounter for screening for malignant neoplasm of colon: Secondary | ICD-10-CM

## 2023-06-02 ENCOUNTER — Encounter: Payer: Self-pay | Admitting: Oncology

## 2023-06-02 ENCOUNTER — Telehealth: Payer: Self-pay | Admitting: *Deleted

## 2023-06-02 ENCOUNTER — Other Ambulatory Visit: Payer: Self-pay | Admitting: *Deleted

## 2023-06-02 DIAGNOSIS — Z1211 Encounter for screening for malignant neoplasm of colon: Secondary | ICD-10-CM

## 2023-06-02 MED ORDER — NA SULFATE-K SULFATE-MG SULF 17.5-3.13-1.6 GM/177ML PO SOLN: 1.0000 | Freq: Once | ORAL | 0 refills | Status: AC

## 2023-06-02 NOTE — Telephone Encounter (Signed)
Gastroenterology Pre-Procedure Review  Request Date: 08/30/2023 Requesting Physician: Dr. Allegra Lai  PATIENT REVIEW QUESTIONS: The patient responded to the following health history questions as indicated:    1. Are you having any GI issues? no 2. Do you have a personal history of Polyps? no 3. Do you have a family history of Colon Cancer or Polyps? no 4. Diabetes Mellitus? no 5. Joint replacements in the past 12 months?no 6. Major health problems in the past 3 months?no 7. Any artificial heart valves, MVP, or defibrillator?no    MEDICATIONS & ALLERGIES:    Patient reports the following regarding taking any anticoagulation/antiplatelet therapy:   Plavix, Coumadin, Eliquis, Xarelto, Lovenox, Pradaxa, Brilinta, or Effient? no Aspirin? no  Patient confirms/reports the following medications:  Current Outpatient Medications  Medication Sig Dispense Refill   albuterol (VENTOLIN HFA) 108 (90 Base) MCG/ACT inhaler Inhale 2 puffs into the lungs every 4 (four) hours as needed for wheezing or shortness of breath. 18 g 0   Cholecalciferol (VITAMIN D3) 50 MCG (2000 UT) capsule Take 1 capsule (2,000 Units total) by mouth daily. 100 capsule 1   fluticasone furoate-vilanterol (BREO ELLIPTA) 100-25 MCG/ACT AEPB Inhale 1 puff into the lungs daily. 1 each 5   loratadine (CLARITIN) 10 MG tablet Take 10 mg by mouth daily.     SYNTHROID 88 MCG tablet Take 1 tablet (88 mcg total) by mouth daily before breakfast on  M,T,W,T and take half tab (44 mcg) poqam on Friday, no dose on sat/sun 54 tablet 0   No current facility-administered medications for this visit.    Patient confirms/reports the following allergies:  Allergies  Allergen Reactions   Aspirin Nausea And Vomiting   Nsaids     Nausea and vomiting     No orders of the defined types were placed in this encounter.   AUTHORIZATION INFORMATION Primary Insurance: 1D#: Group #:  Secondary Insurance: 1D#: Group #:  SCHEDULE INFORMATION: Date:  08/30/2023 Time: Location:  [facility]

## 2023-07-07 ENCOUNTER — Other Ambulatory Visit: Payer: BC Managed Care – PPO

## 2023-07-19 ENCOUNTER — Other Ambulatory Visit: Payer: Self-pay | Admitting: Family Medicine

## 2023-07-19 DIAGNOSIS — E039 Hypothyroidism, unspecified: Secondary | ICD-10-CM

## 2023-07-20 ENCOUNTER — Other Ambulatory Visit: Payer: Self-pay | Admitting: Family Medicine

## 2023-07-20 DIAGNOSIS — E039 Hypothyroidism, unspecified: Secondary | ICD-10-CM

## 2023-07-20 MED ORDER — SYNTHROID 88 MCG PO TABS: ORAL_TABLET | ORAL | 0 refills | Status: DC

## 2023-08-10 DIAGNOSIS — D649 Anemia, unspecified: Secondary | ICD-10-CM | POA: Diagnosis not present

## 2023-08-10 DIAGNOSIS — E538 Deficiency of other specified B group vitamins: Secondary | ICD-10-CM | POA: Diagnosis not present

## 2023-08-10 DIAGNOSIS — E039 Hypothyroidism, unspecified: Secondary | ICD-10-CM | POA: Diagnosis not present

## 2023-08-10 DIAGNOSIS — R739 Hyperglycemia, unspecified: Secondary | ICD-10-CM | POA: Diagnosis not present

## 2023-08-10 DIAGNOSIS — D708 Other neutropenia: Secondary | ICD-10-CM | POA: Diagnosis not present

## 2023-08-11 ENCOUNTER — Other Ambulatory Visit: Payer: Self-pay | Admitting: Family Medicine

## 2023-08-11 DIAGNOSIS — E039 Hypothyroidism, unspecified: Secondary | ICD-10-CM

## 2023-08-11 LAB — CBC WITH DIFFERENTIAL/PLATELET
Absolute Monocytes: 170 cells/uL — ABNORMAL LOW (ref 200–950)
Basophils Absolute: 31 cells/uL (ref 0–200)
Basophils Relative: 0.9 %
Eosinophils Absolute: 238 cells/uL (ref 15–500)
Eosinophils Relative: 7 %
HCT: 31.8 % — ABNORMAL LOW (ref 35.0–45.0)
Hemoglobin: 9.9 g/dL — ABNORMAL LOW (ref 11.7–15.5)
Lymphs Abs: 1846 cells/uL (ref 850–3900)
MCH: 25.8 pg — ABNORMAL LOW (ref 27.0–33.0)
MCHC: 31.1 g/dL — ABNORMAL LOW (ref 32.0–36.0)
MCV: 83 fL (ref 80.0–100.0)
MPV: 11.2 fL (ref 7.5–12.5)
Monocytes Relative: 5 %
Neutro Abs: 1115 cells/uL — ABNORMAL LOW (ref 1500–7800)
Neutrophils Relative %: 32.8 %
Platelets: 183 10*3/uL (ref 140–400)
RBC: 3.83 10*6/uL (ref 3.80–5.10)
RDW: 13.6 % (ref 11.0–15.0)
Total Lymphocyte: 54.3 %
WBC: 3.4 10*3/uL — ABNORMAL LOW (ref 3.8–10.8)

## 2023-08-11 LAB — IRON,TIBC AND FERRITIN PANEL
%SAT: 24 % (calc) (ref 16–45)
Ferritin: 158 ng/mL (ref 16–288)
Iron: 65 ug/dL (ref 45–160)
TIBC: 276 mcg/dL (calc) (ref 250–450)

## 2023-08-11 LAB — B12 AND FOLATE PANEL
Folate: 13.8 ng/mL
Vitamin B-12: 863 pg/mL (ref 200–1100)

## 2023-08-11 LAB — HEMOGLOBIN A1C
Hgb A1c MFr Bld: 6.1 % of total Hgb — ABNORMAL HIGH (ref <5.7)
Mean Plasma Glucose: 128 mg/dL
eAG (mmol/L): 7.1 mmol/L

## 2023-08-11 LAB — TSH: TSH: 0.95 mIU/L (ref 0.40–4.50)

## 2023-08-23 ENCOUNTER — Telehealth: Payer: Self-pay

## 2023-08-23 NOTE — Telephone Encounter (Signed)
Patient states she has a funeral to attend on 08/30/2023 and needs to reschedule colonoscopy. We reschedule to 09/15/2023. Called trish and she will get patient

## 2023-09-14 ENCOUNTER — Encounter: Payer: Self-pay | Admitting: Gastroenterology

## 2023-09-14 ENCOUNTER — Telehealth: Payer: Self-pay

## 2023-09-14 NOTE — Telephone Encounter (Signed)
Please cancel procedure pt didn't pick up  prep nor did she start liquid diet.Pt state she was unaware of this that she was supposed to.  Pt also didn't know to call between 1-3 to get procedure time.

## 2023-09-15 ENCOUNTER — Other Ambulatory Visit: Payer: Self-pay | Admitting: *Deleted

## 2023-09-15 ENCOUNTER — Encounter: Admission: RE | Payer: Self-pay | Source: Home / Self Care

## 2023-09-15 ENCOUNTER — Ambulatory Visit
Admission: RE | Admit: 2023-09-15 | Payer: BC Managed Care – PPO | Source: Home / Self Care | Admitting: Gastroenterology

## 2023-09-15 DIAGNOSIS — Z1211 Encounter for screening for malignant neoplasm of colon: Secondary | ICD-10-CM

## 2023-09-15 SURGERY — COLONOSCOPY WITH PROPOFOL
Anesthesia: General

## 2023-09-15 NOTE — Telephone Encounter (Signed)
Spoken to patient and went over the colonoscopy instructions.  Requesting to reschedule on Thursday, 10/20/2023. Will have reminder to call patient on Monday, 10/17/2023.  New instructions will be sent.

## 2023-09-21 ENCOUNTER — Other Ambulatory Visit: Payer: BC Managed Care – PPO

## 2023-10-04 NOTE — Progress Notes (Unsigned)
Name: Miranda Stafford   MRN: [id number]    DOB: May 25, 1956   Date:10/05/2023       Progress Note  Subjective  Chief Complaint  Follow Up  HPI  Hypothyroidism: Last TSH is at goal, taking levothyroxine 88 mcg M-T and half on Fridays . Fatigue has improved, denies hair loss or dysphagia   Atherosclerosis of aorta: she stopped taking Atorvastatin,, she is aware of increase ASCVD risk but not interested on medication   Connective tissue diease : she was evaluated by Dr. Kathi Ludwig and diagnosed in 2020.  Raynaud's symptoms are worse again, pain increased due to cold weather    MDD: it was bad in 2015, she had to take a leave of absence but did not take medications. At the time her husband was sick, mother was diagnosed with dementia, she was on a custody battle to keep her grandson. She retired in 2019 but back work - part time at [employer] since Summer 2024  B12 deficiency: she is off B12 shots, but has been sublingual B12 and we will recheck yearly    Leucopenia and anemia: seen by Dr. Smith Robert in 2018, had bone marrow biopsy and was given reassurance. We will continue to monitor   Nasal polyps/seasonal allergies: had sinus surgery, she stopped singulair due to nightmares.  She is only taking Zyrtec D , discussed regular zyrtec    Asthma moderate persistent: She has Breo at home but has not been using it on a regular basis.  She denies cough and SOB with activity. No wheezing.   Patient Active Problem List   Diagnosis Date Noted   Atherosclerosis of aorta (HCC) 06/22/2022   Skin sensation disturbance 05/05/2021   Connective tissue disease (HCC) 12/19/2019   Other neutropenia (HCC) 01/10/2018   Allergic rhinitis due to allergen 08/03/2017   B12 deficiency 02/25/2017   Family history of breast cancer in sister 05/24/2016   Hypothyroidism 09/19/2015   Asthma, moderate persistent, poorly-controlled 09/19/2015   Perennial allergic rhinitis with seasonal variation 09/19/2015    Past Surgical History:   Procedure Laterality Date   ABDOMINAL HYSTERECTOMY  2005   fibroid tumor   ADENOIDECTOMY     CESAREAN SECTION     ETHMOIDECTOMY Bilateral 02/28/2020   Procedure: TOTAL ETHMOIDECTOMY;  Surgeon: Geanie Logan, MD;  Location: [facility];  Service: ENT;  Laterality: Bilateral;   FRONTAL SINUS EXPLORATION Bilateral 02/28/2020   Procedure: FRONTAL SINUS EXPLORATION;  Surgeon: Geanie Logan, MD;  Location: [facility];  Service: ENT;  Laterality: Bilateral;   IMAGE GUIDED SINUS SURGERY N/A 02/28/2020   Procedure: IMAGE GUIDED SINUS SURGERY;  Surgeon: Geanie Logan, MD;  Location: [facility];  Service: ENT;  Laterality: N/A;   MAXILLARY ANTROSTOMY Bilateral 02/28/2020   Procedure: MAXILLARY ANTROSTOMY WITH TISSUE REMOVAL;  Surgeon: Geanie Logan, MD;  Location: [facility];  Service: ENT;  Laterality: Bilateral;   SPHENOIDECTOMY Bilateral 02/28/2020   Procedure: Selina Cooley;  Surgeon: Geanie Logan, MD;  Location: [facility];  Service: ENT;  Laterality: Bilateral;   TUBAL LIGATION      Family History  Problem Relation Age of Onset   Heart disease Mother    Hypertension Mother    Hyperlipidemia Mother    SIDS Son    Breast cancer Sister 24   Hypothyroidism Sister    Hypertension Sister     Social History   Tobacco Use   Smoking status: Former    Current packs/day: 0.00    Average packs/day: 0.5 packs/day for  5.8 years (2.9 ttl pk-yrs)    Types: Cigarettes    Start date: 11/16/1979    Quit date: 09/18/1985    Years since quitting: 38.0   Smokeless tobacco: Never  Substance Use Topics   Alcohol use: Yes    Alcohol/week: 0.0 standard drinks of alcohol    Comment: wine/socially/rarely     Current Outpatient Medications:    albuterol (VENTOLIN HFA) 108 (90 Base) MCG/ACT inhaler, Inhale 2 puffs into the lungs every 4 (four) hours as needed for wheezing or shortness of breath., Disp: 18 g, Rfl: 0   Cholecalciferol (VITAMIN D3) 50 MCG (2000 UT)  capsule, Take 1 capsule (2,000 Units total) by mouth daily., Disp: 100 capsule, Rfl: 1   fluticasone furoate-vilanterol (BREO ELLIPTA) 100-25 MCG/ACT AEPB, Inhale 1 puff into the lungs daily., Disp: 1 each, Rfl: 5   loratadine (CLARITIN) 10 MG tablet, Take 10 mg by mouth daily., Disp: , Rfl:    SYNTHROID 88 MCG tablet, TAKE 1 TABLET BY MOUTH DAILY ON MONDAY-THURSDAY, 1/2 TABLET ON FRIDAY, NO DOSE ON SAT/SUN., Disp: 90 tablet, Rfl: 0  Allergies  Allergen Reactions   Aspirin Nausea And Vomiting   Nsaids     Nausea and vomiting     I personally reviewed active problem list, medication list, allergies, family history, social history, health maintenance with the patient/caregiver today.   ROS  Ten systems reviewed and is negative except as mentioned in HPI    Objective  Vitals:   10/05/23 1438  BP: 118/68  Pulse: 81  Resp: 16  SpO2: 97%  Weight: 108 lb (49 kg)  Height: 5\' 2"  (1.575 m)    Body mass index is 19.75 kg/m.  Physical Exam  Constitutional: Patient appears well-developed and well-nourished.  No distress.  HEENT: head atraumatic, normocephalic, pupils equal and reactive to light, , neck supple, throat within normal limits, no thyromegaly  Cardiovascular: Normal rate, regular rhythm and normal heart sounds.  No murmur heard. No BLE edema. Pulmonary/Chest: Effort normal and breath sounds normal. No respiratory distress. Abdominal: Soft.  There is no tenderness. Psychiatric: Patient has a normal mood and affect. behavior is normal. Judgment and thought content normal.    PHQ2/9:    10/05/2023    2:38 PM 05/30/2023    3:22 PM 05/24/2023    3:34 PM 04/08/2023    2:00 PM 04/01/2023    1:17 PM  Depression screen PHQ 2/9  Decreased Interest 0 0 0 0 0  Down, Depressed, Hopeless 0 0 0 0 0  PHQ - 2 Score 0 0 0 0 0  Altered sleeping 0 0 0 0 0  Tired, decreased energy 0 0 0 0 0  Change in appetite 0 0 0 0 0  Feeling bad or failure about yourself  0 0 0 0 0  Trouble  concentrating 0 0 0 0 0  Moving slowly or fidgety/restless 0 0 0 0 0  Suicidal thoughts 0 0 0 0 0  PHQ-9 Score 0 0 0 0 0  Difficult doing work/chores   Not difficult at all  Not difficult at all    phq 9 is negative   Fall Risk:    10/05/2023    2:38 PM 05/30/2023    3:22 PM 05/24/2023    3:34 PM 04/08/2023    2:00 PM 04/01/2023    1:17 PM  Fall Risk   Falls in the past year? 0 0 0 0 0  Number falls in past yr: 0 0 0  0  Injury with Fall? 0 0 0  0  Risk for fall due to : No Fall Risks No Fall Risks  No Fall Risks No Fall Risks  Follow up Falls prevention discussed Falls prevention discussed  Falls prevention discussed Falls prevention discussed;Education provided;Falls evaluation completed      Functional Status Survey: Is the patient deaf or have difficulty hearing?: No Does the patient have difficulty seeing, even when wearing glasses/contacts?: No Does the patient have difficulty concentrating, remembering, or making decisions?: No Does the patient have difficulty walking or climbing stairs?: No Does the patient have difficulty dressing or bathing?: No Does the patient have difficulty doing errands alone such as visiting a doctor's office or shopping?: No    Assessment & Plan  1. Atherosclerosis of aorta (HCC)  Refuses statin therapy   2. Other neutropenia (HCC)  Seen by Dr. Smith Robert in the past, levels stable  3. Connective tissue disease (HCC)  Having more pain from Raynaud's   4. B12 deficiency  Continue supplementation  5. Moderate persistent asthma without complication  - fluticasone furoate-vilanterol (BREO ELLIPTA) 100-25 MCG/ACT AEPB; Inhale 1 puff into the lungs daily.  Dispense: 1 each; Refill: 5  6. Hypothyroidism, adult  - SYNTHROID 88 MCG tablet; TAKE 1 TABLET BY MOUTH DAILY ON MONDAY-THURSDAY, 1/2 TABLET ON FRIDAY, NO DOSE ON SAT/SUN.  Dispense: 90 tablet; Refill: 1  7. Breast cancer screening by mammogram  - MM 3D SCREENING MAMMOGRAM BILATERAL  BREAST; Future  8. Non-seasonal allergic rhinitis due to other allergic trigger  - Azelastine-Fluticasone 137-50 MCG/ACT SUSP; Place 1 spray into the nose every 12 (twelve) hours.  Dispense: 23 g; Refill: 5

## 2023-10-05 ENCOUNTER — Ambulatory Visit (INDEPENDENT_AMBULATORY_CARE_PROVIDER_SITE_OTHER): Payer: Medicare HMO | Admitting: Family Medicine

## 2023-10-05 ENCOUNTER — Encounter: Payer: Self-pay | Admitting: Family Medicine

## 2023-10-05 VITALS — BP 118/68 | HR 81 | Resp 16 | Ht 62.0 in | Wt 108.0 lb

## 2023-10-05 DIAGNOSIS — I700 Atherosclerosis of aorta: Secondary | ICD-10-CM

## 2023-10-05 DIAGNOSIS — Z1231 Encounter for screening mammogram for malignant neoplasm of breast: Secondary | ICD-10-CM

## 2023-10-05 DIAGNOSIS — E538 Deficiency of other specified B group vitamins: Secondary | ICD-10-CM | POA: Diagnosis not present

## 2023-10-05 DIAGNOSIS — M359 Systemic involvement of connective tissue, unspecified: Secondary | ICD-10-CM | POA: Diagnosis not present

## 2023-10-05 DIAGNOSIS — E039 Hypothyroidism, unspecified: Secondary | ICD-10-CM

## 2023-10-05 DIAGNOSIS — J4540 Moderate persistent asthma, uncomplicated: Secondary | ICD-10-CM

## 2023-10-05 DIAGNOSIS — D708 Other neutropenia: Secondary | ICD-10-CM

## 2023-10-05 DIAGNOSIS — J3089 Other allergic rhinitis: Secondary | ICD-10-CM | POA: Diagnosis not present

## 2023-10-05 MED ORDER — AZELASTINE-FLUTICASONE 137-50 MCG/ACT NA SUSP: 1.0000 | Freq: Two times a day (BID) | NASAL | 5 refills | Status: DC

## 2023-10-05 MED ORDER — FLUTICASONE FUROATE-VILANTEROL 100-25 MCG/ACT IN AEPB: 1.0000 | INHALATION_SPRAY | Freq: Every day | RESPIRATORY_TRACT | 5 refills | Status: DC

## 2023-10-05 MED ORDER — SYNTHROID 88 MCG PO TABS: ORAL_TABLET | ORAL | 1 refills | Status: DC

## 2023-10-10 ENCOUNTER — Ambulatory Visit: Payer: BC Managed Care – PPO | Admitting: Family Medicine

## 2023-10-19 ENCOUNTER — Telehealth: Payer: Self-pay | Admitting: *Deleted

## 2023-10-19 NOTE — Telephone Encounter (Signed)
Patient called office stating the Suprep was not sent to [facility].  I had to call [facility] and request for them to fill the Rx for Suprep that was sent back on 78/2024. I was told they will have it ready within an hour.  I have called patient back to let her know.

## 2023-10-20 ENCOUNTER — Encounter: Payer: Self-pay | Admitting: Gastroenterology

## 2023-10-20 ENCOUNTER — Ambulatory Visit
Admission: RE | Admit: 2023-10-20 | Discharge: 2023-10-20 | Disposition: A | Discharge status: Home / Self Care | Payer: Medicare HMO | Attending: Gastroenterology | Admitting: Gastroenterology

## 2023-10-20 ENCOUNTER — Ambulatory Visit: Payer: Medicare HMO | Admitting: Anesthesiology

## 2023-10-20 ENCOUNTER — Encounter
Admission: RE | Disposition: A | Discharge status: Home / Self Care | Payer: Self-pay | Source: Home / Self Care | Attending: Gastroenterology

## 2023-10-20 DIAGNOSIS — Z87891 Personal history of nicotine dependence: Secondary | ICD-10-CM | POA: Diagnosis not present

## 2023-10-20 DIAGNOSIS — Z1211 Encounter for screening for malignant neoplasm of colon: Secondary | ICD-10-CM | POA: Diagnosis not present

## 2023-10-20 DIAGNOSIS — E039 Hypothyroidism, unspecified: Secondary | ICD-10-CM | POA: Insufficient documentation

## 2023-10-20 DIAGNOSIS — J45909 Unspecified asthma, uncomplicated: Secondary | ICD-10-CM | POA: Insufficient documentation

## 2023-10-20 HISTORY — PX: COLONOSCOPY WITH PROPOFOL: SHX5780

## 2023-10-20 LAB — GLUCOSE, CAPILLARY
Glucose-Capillary: 176 mg/dL — ABNORMAL HIGH (ref 70–99)
Glucose-Capillary: 49 mg/dL — ABNORMAL LOW (ref 70–99)
Glucose-Capillary: 212 mg/dL — ABNORMAL HIGH (ref 70–99)

## 2023-10-20 SURGERY — COLONOSCOPY WITH PROPOFOL
Anesthesia: General

## 2023-10-20 MED ORDER — ONDANSETRON HCL 4 MG/2ML IJ SOLN: INTRAMUSCULAR | Status: DC | PRN

## 2023-10-20 MED ORDER — PROPOFOL 500 MG/50ML IV EMUL
INTRAVENOUS | Status: DC | PRN
  Administered 2023-10-20: 120 ug/kg/min via INTRAVENOUS

## 2023-10-20 MED ORDER — DEXTROSE 50 % IV SOLN
INTRAVENOUS | Status: AC
  Filled 2023-10-20: qty 50

## 2023-10-20 MED ORDER — PROPOFOL 1000 MG/100ML IV EMUL
INTRAVENOUS | Status: AC
Start: 2023-10-20 — End: ?
  Filled 2023-10-20: qty 100

## 2023-10-20 MED ORDER — SODIUM CHLORIDE 0.9 % IV SOLN: INTRAVENOUS | Status: DC

## 2023-10-20 MED ORDER — DEXTROSE 50 % IV SOLN
1.0000 | Freq: Once | INTRAVENOUS | Status: AC
  Administered 2023-10-20: 50 mL via INTRAVENOUS

## 2023-10-20 MED ORDER — PROPOFOL 10 MG/ML IV BOLUS
INTRAVENOUS | Status: DC | PRN
Start: 2023-10-20 — End: 2023-10-20
  Administered 2023-10-20: 50 mg via INTRAVENOUS

## 2023-10-20 NOTE — H&P (Signed)
Arlyss Repress, MD [address]  [address]  [place], [place] [address]  Main: [phone]  Fax: [phone] Pager: [phone]  Primary Care Physician:  Alba Cory, MD Primary Gastroenterologist:  Dr. Arlyss Repress  Pre-Procedure History & Physical: HPI:  Meoshia Hawken is a 67 y.o. female is here for an colonoscopy.   Past Medical History:  Diagnosis Date   Allergy    Anemia    Arthritis    Asthma    Complication of anesthesia    slow to wake   Fibroid tumor    Herpes zoster    Hyperlipidemia    Hypothyroidism    Insomnia    Osteopenia    Thyroid disease    Wears dentures    partial upper and lower    Past Surgical History:  Procedure Laterality Date   ABDOMINAL HYSTERECTOMY  2005   fibroid tumor   ADENOIDECTOMY     CESAREAN SECTION     ETHMOIDECTOMY Bilateral 02/28/2020   Procedure: TOTAL ETHMOIDECTOMY;  Surgeon: Geanie Logan, MD;  Location: [facility];  Service: ENT;  Laterality: Bilateral;   FRONTAL SINUS EXPLORATION Bilateral 02/28/2020   Procedure: FRONTAL SINUS EXPLORATION;  Surgeon: Geanie Logan, MD;  Location: [facility];  Service: ENT;  Laterality: Bilateral;   IMAGE GUIDED SINUS SURGERY N/A 02/28/2020   Procedure: IMAGE GUIDED SINUS SURGERY;  Surgeon: Geanie Logan, MD;  Location: [facility];  Service: ENT;  Laterality: N/A;   MAXILLARY ANTROSTOMY Bilateral 02/28/2020   Procedure: MAXILLARY ANTROSTOMY WITH TISSUE REMOVAL;  Surgeon: Geanie Logan, MD;  Location: [facility];  Service: ENT;  Laterality: Bilateral;   SPHENOIDECTOMY Bilateral 02/28/2020   Procedure: Selina Cooley;  Surgeon: Geanie Logan, MD;  Location: [facility];  Service: ENT;  Laterality: Bilateral;   TUBAL LIGATION      Prior to Admission medications   Medication Sig Start Date End Date Taking? Authorizing Provider  Cholecalciferol (VITAMIN D3) 50 MCG (2000 UT) capsule Take 1 capsule (2,000 Units total) by mouth daily. 01/01/22   Yes Sowles, Danna Hefty, MD  loratadine (CLARITIN) 10 MG tablet Take 10 mg by mouth daily.   Yes [provider]  SYNTHROID 88 MCG tablet TAKE 1 TABLET BY MOUTH DAILY ON MONDAY-THURSDAY, 1/2 TABLET ON FRIDAY, NO DOSE ON SAT/SUN. 10/05/23  Yes Sowles, Danna Hefty, MD  albuterol (VENTOLIN HFA) 108 (90 Base) MCG/ACT inhaler Inhale 2 puffs into the lungs every 4 (four) hours as needed for wheezing or shortness of breath. 08/16/22   Alba Cory, MD  Azelastine-Fluticasone 137-50 MCG/ACT SUSP Place 1 spray into the nose every 12 (twelve) hours. 10/05/23   Alba Cory, MD  fluticasone furoate-vilanterol (BREO ELLIPTA) 100-25 MCG/ACT AEPB Inhale 1 puff into the lungs daily. 10/05/23   Alba Cory, MD    Allergies as of 09/15/2023 - Review Complete 09/14/2023  Allergen Reaction Noted   Aspirin Nausea And Vomiting 02/20/2020   Nsaids  11/28/2019    Family History  Problem Relation Age of Onset   Heart disease Mother    Hypertension Mother    Hyperlipidemia Mother    SIDS Son    Breast cancer Sister 57   Hypothyroidism Sister    Hypertension Sister     Social History   Socioeconomic History   Marital status: Married    Spouse name: Genevie Cheshire   Number of children: 2   Years of education: Not on file   Highest education level: Professional school degree (e.g., MD, DDS, DVM, JD)  Occupational History  Occupation: Retired Runner, broadcasting/film/video  Tobacco Use   Smoking status: Former    Current packs/day: 0.00    Average packs/day: 0.5 packs/day for 5.8 years (2.9 ttl pk-yrs)    Types: Cigarettes    Start date: 11/16/1979    Quit date: 09/18/1985    Years since quitting: 38.1   Smokeless tobacco: Never  Vaping Use   Vaping status: Never Used  Substance and Sexual Activity   Alcohol use: Yes    Alcohol/week: 0.0 standard drinks of alcohol    Comment: wine/socially/rarely   Drug use: No   Sexual activity: Yes    Partners: Male  Other Topics Concern   Not on file  Social History Narrative    Not on file   Social Determinants of Health   Financial Resource Strain: Low Risk  (05/30/2023)   Overall Financial Resource Strain (CARDIA)    Difficulty of Paying Living Expenses: Not hard at all  Food Insecurity: No Food Insecurity (05/30/2023)   Hunger Vital Sign    Worried About Running Out of Food in the Last Year: Never true    Ran Out of Food in the Last Year: Never true  Transportation Needs: No Transportation Needs (05/30/2023)   PRAPARE - Administrator, Civil Service (Medical): No    Lack of Transportation (Non-Medical): No  Physical Activity: Sufficiently Active (05/30/2023)   Exercise Vital Sign    Days of Exercise per Week: 7 days    Minutes of Exercise per Session: 30 min  Stress: No Stress Concern Present (05/30/2023)   [employer] of Occupational Health - Occupational Stress Questionnaire    Feeling of Stress : Not at all  Social Connections: Socially Integrated (05/30/2023)   Social Connection and Isolation Panel [NHANES]    Frequency of Communication with Friends and Family: Three times a week    Frequency of Social Gatherings with Friends and Family: More than three times a week    Attends Religious Services: More than 4 times per year    Active Member of [employer] or Organizations: Yes    Attends Engineer, structural: More than 4 times per year    Marital Status: Married  Catering manager Violence: Not At Risk (05/30/2023)   Humiliation, Afraid, Rape, and Kick questionnaire    Fear of Current or Ex-Partner: No    Emotionally Abused: No    Physically Abused: No    Sexually Abused: No    Review of Systems: See HPI, otherwise negative ROS  Physical Exam: BP 106/69   Pulse (!) 52   Temp (!) 97.2 F (36.2 C) (Temporal)   Resp 16   Ht 5\' 2"  (1.575 m)   Wt 48.5 kg   SpO2 99%   BMI 19.57 kg/m  General:   Alert,  pleasant and cooperative in NAD Head:  Normocephalic and atraumatic. Neck:  Supple; no masses or thyromegaly. Lungs:   Clear throughout to auscultation.    Heart:  Regular rate and rhythm. Abdomen:  Soft, nontender and nondistended. Normal bowel sounds, without guarding, and without rebound.   Neurologic:  Alert and  oriented x4;  grossly normal neurologically.  Impression/Plan: Miranda Stafford is here for an colonoscopy to be performed for colon cancer screening  Risks, benefits, limitations, and alternatives regarding  colonoscopy have been reviewed with the patient.  Questions have been answered.  All parties agreeable.   Lannette Donath, MD  10/20/2023, 8:16 AM

## 2023-10-20 NOTE — Anesthesia Preprocedure Evaluation (Signed)
Anesthesia Evaluation  Patient identified by MRN, date of birth, ID band Patient awake    Reviewed: Allergy & Precautions, NPO status , Patient's Chart, lab work & pertinent test results  History of Anesthesia Complications (+) PROLONGED EMERGENCE and history of anesthetic complications  Airway Mallampati: III  TM Distance: >3 FB Neck ROM: full    Dental  (+) Dental Advidsory Given   Pulmonary asthma , former smoker   Pulmonary exam normal        Cardiovascular negative cardio ROS Normal cardiovascular exam     Neuro/Psych negative neurological ROS  negative psych ROS   GI/Hepatic negative GI ROS, Neg liver ROS,,,  Endo/Other  Hypothyroidism    Renal/GU negative Renal ROS  negative genitourinary   Musculoskeletal   Abdominal   Peds  Hematology negative hematology ROS (+)   Anesthesia Other Findings Patient states she feels very weak this morning. Blood sugar taken and it was 49. Will treat before procedure. Patient denies any other symptoms.   Past Medical History: No date: Allergy No date: Anemia No date: Arthritis No date: Asthma No date: Complication of anesthesia     Comment:  slow to wake No date: Fibroid tumor No date: Herpes zoster No date: Hyperlipidemia No date: Hypothyroidism No date: Insomnia No date: Osteopenia No date: Thyroid disease No date: Wears dentures     Comment:  partial upper and lower  Past Surgical History: 2005: ABDOMINAL HYSTERECTOMY     Comment:  fibroid tumor No date: ADENOIDECTOMY No date: CESAREAN SECTION 02/28/2020: ETHMOIDECTOMY; Bilateral     Comment:  Procedure: TOTAL ETHMOIDECTOMY;  Surgeon: Geanie Logan,              MD;  Location: [facility];  Service: ENT;                Laterality: Bilateral; 02/28/2020: FRONTAL SINUS EXPLORATION; Bilateral     Comment:  Procedure: FRONTAL SINUS EXPLORATION;  Surgeon: Geanie Logan, MD;  Location:  [facility];  Service: ENT;               Laterality: Bilateral; 02/28/2020: IMAGE GUIDED SINUS SURGERY; N/A     Comment:  Procedure: IMAGE GUIDED SINUS SURGERY;  Surgeon:               Geanie Logan, MD;  Location: [facility];                Service: ENT;  Laterality: N/A; 02/28/2020: MAXILLARY ANTROSTOMY; Bilateral     Comment:  Procedure: MAXILLARY ANTROSTOMY WITH TISSUE REMOVAL;                Surgeon: Geanie Logan, MD;  Location: [facility]               [facility];  Service: ENT;  Laterality: Bilateral; 02/28/2020: SPHENOIDECTOMY; Bilateral     Comment:  Procedure: SPHENOIDECTOMY;  Surgeon: Geanie Logan, MD;               Location: [facility];  Service: ENT;                Laterality: Bilateral; No date: TUBAL LIGATION  BMI    Body Mass Index: 19.57 kg/m      Reproductive/Obstetrics negative OB ROS  Anesthesia Physical Anesthesia Plan  ASA: 2  Anesthesia Plan: General   Post-op Pain Management: Minimal or no pain anticipated   Induction: Intravenous  PONV Risk Score and Plan: 3 and Propofol infusion, TIVA and Ondansetron  Airway Management Planned: Nasal Cannula  Additional Equipment: None  Intra-op Plan:   Post-operative Plan:   Informed Consent: I have reviewed the patients History and Physical, chart, labs and discussed the procedure including the risks, benefits and alternatives for the proposed anesthesia with the patient or authorized representative who has indicated his/her understanding and acceptance.     Dental advisory given  Plan Discussed with: CRNA and Surgeon  Anesthesia Plan Comments: (Discussed risks of anesthesia with patient, including possibility of difficulty with spontaneous ventilation under anesthesia necessitating airway intervention, PONV, and rare risks such as cardiac or respiratory or neurological events, and allergic reactions. Discussed the role of CRNA  in patient's perioperative care. Patient understands.)       Anesthesia Quick Evaluation

## 2023-10-20 NOTE — Transfer of Care (Signed)
Immediate Anesthesia Transfer of Care Note  Patient: Miranda Stafford  Procedure(s) Performed: COLONOSCOPY WITH PROPOFOL  Patient Location: PACU  Anesthesia Type:General  Level of Consciousness: awake and sedated  Airway & Oxygen Therapy: Patient Spontanous Breathing and Patient connected to nasal cannula oxygen  Post-op Assessment: Report given to RN and Post -op Vital signs reviewed and stable  Post vital signs: Reviewed and stable  Last Vitals:  Vitals Value Taken Time  BP    Temp    Pulse    Resp    SpO2      Last Pain:  Vitals:   10/20/23 0759  TempSrc:   PainSc: 0-No pain         Complications: There were no known notable events for this encounter.

## 2023-10-20 NOTE — Anesthesia Postprocedure Evaluation (Signed)
Anesthesia Post Note  Patient: Miranda Stafford  Procedure(s) Performed: COLONOSCOPY WITH PROPOFOL  Patient location during evaluation: Endoscopy Anesthesia Type: General Level of consciousness: awake and alert Pain management: pain level controlled Vital Signs Assessment: post-procedure vital signs reviewed and stable Respiratory status: spontaneous breathing, nonlabored ventilation, respiratory function stable and patient connected to nasal cannula oxygen Cardiovascular status: blood pressure returned to baseline and stable Postop Assessment: no apparent nausea or vomiting Anesthetic complications: no  There were no known notable events for this encounter.   Last Vitals:  Vitals:   10/20/23 0903 10/20/23 0909  BP: (!) 93/51 102/71  Pulse:  (!) 53  Resp:    Temp:    SpO2: 100% 100%    Last Pain:  Vitals:   10/20/23 0909  TempSrc:   PainSc: 0-No pain                 Stephanie Coup

## 2023-10-20 NOTE — Op Note (Signed)
[facility] Patient Name: Miranda Stafford Procedure Date: 10/20/2023 8:10 AM MRN: [id number] Account #: [id number] Date of Birth: 07-09-1956 Admit Type: Outpatient Age: 67 Room: [facility] ENDO ROOM 3 Gender: Female Note Status: Finalized Instrument Name: Peds Colonoscope [id number] Procedure:             Colonoscopy Indications:           Screening for colorectal malignant neoplasm Providers:             Toney Reil MD, MD Referring MD:          Onnie Boer. Sowles, MD (Referring MD) Medicines:             General Anesthesia Complications:         No immediate complications. Estimated blood loss: None. Procedure:             Pre-Anesthesia Assessment:                        - Prior to the procedure, a History and Physical was                         performed, and patient medications and allergies were                         reviewed. The patient is competent. The risks and                         benefits of the procedure and the sedation options and                         risks were discussed with the patient. All questions                         were answered and informed consent was obtained.                         Patient identification and proposed procedure were                         verified by the physician, the nurse, the                         anesthesiologist, the anesthetist and the technician                         in the pre-procedure area in the procedure room in the                         endoscopy suite. Mental Status Examination: alert and                         oriented. Airway Examination: normal oropharyngeal                         airway and neck mobility. Respiratory Examination:                         clear to auscultation. CV Examination: normal.  Prophylactic Antibiotics: The patient does not require                         prophylactic antibiotics. Prior Anticoagulants: The                          patient has taken no anticoagulant or antiplatelet                         agents. ASA Grade Assessment: II - A patient with mild                         systemic disease. After reviewing the risks and                         benefits, the patient was deemed in satisfactory                         condition to undergo the procedure. The anesthesia                         plan was to use general anesthesia. Immediately prior                         to administration of medications, the patient was                         re-assessed for adequacy to receive sedatives. The                         heart rate, respiratory rate, oxygen saturations,                         blood pressure, adequacy of pulmonary ventilation, and                         response to care were monitored throughout the                         procedure. The physical status of the patient was                         re-assessed after the procedure.                        After obtaining informed consent, the colonoscope was                         passed under direct vision. Throughout the procedure,                         the patient's blood pressure, pulse, and oxygen                         saturations were monitored continuously. The                         Colonoscope was introduced through the anus and  advanced to the the cecum, identified by appendiceal                         orifice and ileocecal valve. The colonoscopy was                         performed without difficulty. The patient tolerated                         the procedure well. The quality of the bowel                         preparation was evaluated using the BBPS [facility] Bowel                         Preparation Scale) with scores of: Right Colon = 3,                         Transverse Colon = 3 and Left Colon = 3 (entire mucosa                         seen well with no residual staining, small fragments                          of stool or opaque liquid). The total BBPS score                         equals 9. The ileocecal valve, appendiceal orifice,                         and rectum were photographed. Findings:      The perianal and digital rectal examinations were normal. Pertinent       negatives include normal sphincter tone and no palpable rectal lesions.      The entire examined colon appeared normal.      The retroflexed view of the distal rectum and anal verge was normal and       showed no anal or rectal abnormalities. Impression:            - The entire examined colon is normal.                        - The distal rectum and anal verge are normal on                         retroflexion view.                        - No specimens collected. Recommendation:        - Discharge patient to home (with escort).                        - Resume previous diet today.                        - Continue present medications.                        - Repeat colonoscopy in  10 years for screening                         purposes. Procedure Code(s):     --- Professional ---                        W0981, Colorectal cancer screening; colonoscopy on                         individual not meeting criteria for high risk Diagnosis Code(s):     --- Professional ---                        Z12.11, Encounter for screening for malignant neoplasm                         of colon CPT copyright 2022 American Medical Association. All rights reserved. The codes documented in this report are preliminary and upon coder review may  be revised to meet current compliance requirements. Dr. Libby Maw Toney Reil MD, MD 10/20/2023 8:48:18 AM This report has been signed electronically. Number of Addenda: 0 Note Initiated On: 10/20/2023 8:10 AM Scope Withdrawal Time: 0 hours 4 minutes 51 seconds  Total Procedure Duration: 0 hours 6 minutes 41 seconds  Estimated Blood Loss:  Estimated blood loss: none.      [facility]

## 2023-10-21 ENCOUNTER — Encounter: Payer: Self-pay | Admitting: Gastroenterology

## 2023-11-14 ENCOUNTER — Encounter: Payer: Self-pay | Admitting: Oncology

## 2024-01-20 ENCOUNTER — Ambulatory Visit
Admission: RE | Admit: 2024-01-20 | Discharge: 2024-01-20 | Disposition: A | Discharge status: Home / Self Care | Payer: Medicare HMO | Source: Ambulatory Visit | Attending: Family Medicine | Admitting: Family Medicine

## 2024-01-20 ENCOUNTER — Encounter: Payer: Self-pay | Admitting: Oncology

## 2024-01-20 ENCOUNTER — Encounter: Payer: Self-pay | Admitting: Family Medicine

## 2024-01-20 DIAGNOSIS — Z90710 Acquired absence of both cervix and uterus: Secondary | ICD-10-CM | POA: Diagnosis not present

## 2024-01-20 DIAGNOSIS — M8589 Other specified disorders of bone density and structure, multiple sites: Secondary | ICD-10-CM | POA: Diagnosis not present

## 2024-01-20 DIAGNOSIS — Z79890 Hormone replacement therapy (postmenopausal): Secondary | ICD-10-CM | POA: Diagnosis not present

## 2024-01-20 DIAGNOSIS — Z13820 Encounter for screening for osteoporosis: Secondary | ICD-10-CM | POA: Insufficient documentation

## 2024-01-20 DIAGNOSIS — Z780 Asymptomatic menopausal state: Secondary | ICD-10-CM | POA: Diagnosis present

## 2024-01-20 DIAGNOSIS — Z1231 Encounter for screening mammogram for malignant neoplasm of breast: Secondary | ICD-10-CM

## 2024-01-20 DIAGNOSIS — E039 Hypothyroidism, unspecified: Secondary | ICD-10-CM | POA: Insufficient documentation

## 2024-02-22 ENCOUNTER — Telehealth: Payer: Self-pay | Admitting: Family Medicine

## 2024-02-22 ENCOUNTER — Encounter: Payer: Self-pay | Admitting: Family Medicine

## 2024-02-22 ENCOUNTER — Ambulatory Visit: Admitting: Family Medicine

## 2024-02-22 VITALS — BP 102/64 | HR 84 | Resp 16 | Ht 62.0 in | Wt 113.2 lb

## 2024-02-22 DIAGNOSIS — R5383 Other fatigue: Secondary | ICD-10-CM

## 2024-02-22 DIAGNOSIS — E538 Deficiency of other specified B group vitamins: Secondary | ICD-10-CM

## 2024-02-22 DIAGNOSIS — R739 Hyperglycemia, unspecified: Secondary | ICD-10-CM

## 2024-02-22 DIAGNOSIS — E039 Hypothyroidism, unspecified: Secondary | ICD-10-CM

## 2024-02-22 DIAGNOSIS — D638 Anemia in other chronic diseases classified elsewhere: Secondary | ICD-10-CM | POA: Insufficient documentation

## 2024-02-22 DIAGNOSIS — M359 Systemic involvement of connective tissue, unspecified: Secondary | ICD-10-CM

## 2024-02-22 DIAGNOSIS — F439 Reaction to severe stress, unspecified: Secondary | ICD-10-CM

## 2024-02-22 DIAGNOSIS — J4540 Moderate persistent asthma, uncomplicated: Secondary | ICD-10-CM

## 2024-02-22 DIAGNOSIS — D708 Other neutropenia: Secondary | ICD-10-CM

## 2024-02-22 DIAGNOSIS — E559 Vitamin D deficiency, unspecified: Secondary | ICD-10-CM

## 2024-02-22 DIAGNOSIS — Z23 Encounter for immunization: Secondary | ICD-10-CM

## 2024-02-22 DIAGNOSIS — I700 Atherosclerosis of aorta: Secondary | ICD-10-CM | POA: Diagnosis not present

## 2024-02-22 DIAGNOSIS — J3089 Other allergic rhinitis: Secondary | ICD-10-CM

## 2024-02-22 MED ORDER — TRELEGY ELLIPTA 100-62.5-25 MCG/ACT IN AEPB: 1.0000 | INHALATION_SPRAY | Freq: Every day | RESPIRATORY_TRACT | 5 refills | Status: DC

## 2024-02-22 MED ORDER — SYNTHROID 88 MCG PO TABS: ORAL_TABLET | ORAL | 1 refills | Status: DC

## 2024-02-22 MED ORDER — AZELASTINE-FLUTICASONE 137-50 MCG/ACT NA SUSP: 1.0000 | Freq: Two times a day (BID) | NASAL | 5 refills | Status: DC

## 2024-02-22 NOTE — Telephone Encounter (Signed)
 Prior auth from [place]  Azelastine-Fluticasone 137-50 MCG/ACT SUSP   Key: Rosiland Oz

## 2024-02-22 NOTE — Telephone Encounter (Signed)
 PA done waiting on insurance to deternine

## 2024-02-22 NOTE — Progress Notes (Signed)
 Name: Miranda Stafford   MRN: [id number]    DOB: [birth year]   Date:02/22/2024       Progress Note  Subjective  Chief Complaint  Chief Complaint  Patient presents with   Fatigue    X3 weeks   Discussed the use of AI scribe software for clinical note transcription with the patient, who gave verbal consent to proceed.  History of Present Illness Miranda Stafford "Erie Noe" is a 68 year old female with anemia, hypothyroidism, connective tissue disease, and asthma who presents with fatigue and stress following her husband's recent stroke.  She has been experiencing significant fatigue over the past three weeks, which she attributes to stress related to her husband's recent stroke. She describes feeling 'tired' and 'wonky', with light headaches and difficulty sleeping due to bilateral leg pain. Her fatigue may also be related to her anemia and hypothyroidism, although her last thyroid test was normal. She takes levothyroxine 88 mcg Monday through Thursday and half a dose on Friday, with no medication on weekends.  She has a history of anemia and chronic low white blood cell count, which was previously evaluated by a hematologist in 2018 with a bone marrow biopsy.  Hypothyroidism, managed with levothyroxine, and reports no hair loss, swallowing difficulties, or changes in bowel movements.  She has a history of connective tissue disease, diagnosed in 2020, and has previously used prednisone for flares. She experiences rRaynaud's symptoms that worsen in cold weather but reports no current issues.  She has asthma with nasal polyps and seasonal allergies, previously managed with Breo, which she reports is no longer effective and causes headaches. She uses a nasal spray and takes over-the-counter loratadine. She experiences shortness of breath with exertion, such as climbing stairs, and notes that pollen exacerbates her symptoms.  She has a history of major depressive disorder from 2015, which was associated  with significant life stressors, including her husband's illness and custody of her grandson. She acknowledges that her current fatigue could be an early sign of depression, as she feels 'tired' and 'achy' without sadness or crying spells.  She has a history of hypoglycemia, which was noted during a recent colonoscopy when her blood sugar dropped significantly. She attributes this to the preparatory medication and fasting required for the procedure.   Atherosclerosis and dyslipidemia, currently only on lifestyle modifications  Patient Active Problem List   Diagnosis Date Noted   Colon cancer screening 10/20/2023   Atherosclerosis of aorta (HCC) 06/22/2022   Skin sensation disturbance 05/05/2021   Connective tissue disease (HCC) 12/19/2019   Other neutropenia (HCC) 01/10/2018   Allergic rhinitis due to allergen 08/03/2017   B12 deficiency 02/25/2017   Family history of breast cancer in sister 05/24/2016   Hypothyroidism 09/19/2015   Asthma, moderate persistent, poorly-controlled 09/19/2015   Perennial allergic rhinitis with seasonal variation 09/19/2015    Past Surgical History:  Procedure Laterality Date   ABDOMINAL HYSTERECTOMY  2005   fibroid tumor   ADENOIDECTOMY     CESAREAN SECTION     COLONOSCOPY WITH PROPOFOL N/A 10/20/2023   Procedure: COLONOSCOPY WITH PROPOFOL;  Surgeon: Toney Reil, MD;  Location: [facility] ENDOSCOPY;  Service: Gastroenterology;  Laterality: N/A;   ETHMOIDECTOMY Bilateral 02/28/2020   Procedure: TOTAL ETHMOIDECTOMY;  Surgeon: Geanie Logan, MD;  Location: [facility];  Service: ENT;  Laterality: Bilateral;   FRONTAL SINUS EXPLORATION Bilateral 02/28/2020   Procedure: FRONTAL SINUS EXPLORATION;  Surgeon: Geanie Logan, MD;  Location: [facility];  Service: ENT;  Laterality: Bilateral;  IMAGE GUIDED SINUS SURGERY N/A 02/28/2020   Procedure: IMAGE GUIDED SINUS SURGERY;  Surgeon: Geanie Logan, MD;  Location: [facility];  Service: ENT;   Laterality: N/A;   MAXILLARY ANTROSTOMY Bilateral 02/28/2020   Procedure: MAXILLARY ANTROSTOMY WITH TISSUE REMOVAL;  Surgeon: Geanie Logan, MD;  Location: [facility];  Service: ENT;  Laterality: Bilateral;   SPHENOIDECTOMY Bilateral 02/28/2020   Procedure: Selina Cooley;  Surgeon: Geanie Logan, MD;  Location: [facility];  Service: ENT;  Laterality: Bilateral;   TUBAL LIGATION      Family History  Problem Relation Age of Onset   Heart disease Mother    Hypertension Mother    Hyperlipidemia Mother    SIDS Son    Breast cancer Sister 67   Hypothyroidism Sister    Hypertension Sister     Social History   Tobacco Use   Smoking status: Former    Current packs/day: 0.00    Average packs/day: 0.5 packs/day for 5.8 years (2.9 ttl pk-yrs)    Types: Cigarettes    Start date: 11/16/1979    Quit date: 09/18/1985    Years since quitting: 38.4   Smokeless tobacco: Never  Substance Use Topics   Alcohol use: Yes    Alcohol/week: 0.0 standard drinks of alcohol    Comment: wine/socially/rarely     Current Outpatient Medications:    albuterol (VENTOLIN HFA) 108 (90 Base) MCG/ACT inhaler, Inhale 2 puffs into the lungs every 4 (four) hours as needed for wheezing or shortness of breath., Disp: 18 g, Rfl: 0   Azelastine-Fluticasone 137-50 MCG/ACT SUSP, Place 1 spray into the nose every 12 (twelve) hours., Disp: 23 g, Rfl: 5   Cholecalciferol (VITAMIN D3) 50 MCG (2000 UT) capsule, Take 1 capsule (2,000 Units total) by mouth daily., Disp: 100 capsule, Rfl: 1   fluticasone furoate-vilanterol (BREO ELLIPTA) 100-25 MCG/ACT AEPB, Inhale 1 puff into the lungs daily., Disp: 1 each, Rfl: 5   loratadine (CLARITIN) 10 MG tablet, Take 10 mg by mouth daily., Disp: , Rfl:    SYNTHROID 88 MCG tablet, TAKE 1 TABLET BY MOUTH DAILY ON MONDAY-THURSDAY, 1/2 TABLET ON FRIDAY, NO DOSE ON SAT/SUN., Disp: 90 tablet, Rfl: 1  Allergies  Allergen Reactions   Aspirin Nausea And Vomiting   Nsaids      Nausea and vomiting     I personally reviewed active problem list, medication list, allergies with the patient/caregiver today.   ROS  Ten systems reviewed and is negative except as mentioned in HPI    Objective Physical Exam CONSTITUTIONAL: Patient appears well-developed and well-nourished. No distress. HEENT: Head atraumatic, normocephalic, neck supple. Throat normal. CARDIOVASCULAR: Normal rate, regular rhythm and normal heart sounds. No murmur heard. No BLE edema. PULMONARY: Effort normal and breath sounds normal. No respiratory distress. ABDOMINAL: There is no tenderness or distention. MUSCULOSKELETAL: Normal gait. Without gross motor or sensory deficit. PSYCHIATRIC: Patient has a normal mood and affect. Behavior is normal. Judgment and thought content normal.  Vitals:   02/22/24 0820  BP: 102/64  Pulse: 84  Resp: 16  Weight: 113 lb 3.2 oz (51.3 kg)  Height: 5\' 2"  (1.575 m)    Body mass index is 20.7 kg/m.    PHQ2/9:    02/22/2024    8:10 AM 10/05/2023    2:38 PM 05/30/2023    3:22 PM 05/24/2023    3:34 PM 04/08/2023    2:00 PM  Depression screen PHQ 2/9  Decreased Interest 0 0 0 0 0  Down, Depressed, Hopeless  0 0 0 0 0  PHQ - 2 Score 0 0 0 0 0  Altered sleeping 0 0 0 0 0  Tired, decreased energy 0 0 0 0 0  Change in appetite 0 0 0 0 0  Feeling bad or failure about yourself  0 0 0 0 0  Trouble concentrating 0 0 0 0 0  Moving slowly or fidgety/restless 0 0 0 0 0  Suicidal thoughts 0 0 0 0 0  PHQ-9 Score 0 0 0 0 0  Difficult doing work/chores Not difficult at all   Not difficult at all     phq 9 is negative  Fall Risk:    02/22/2024    8:10 AM 10/05/2023    2:38 PM 05/30/2023    3:22 PM 05/24/2023    3:34 PM 04/08/2023    2:00 PM  Fall Risk   Falls in the past year? 0 0 0 0 0  Number falls in past yr: 0 0 0 0   Injury with Fall? 0 0 0 0   Risk for fall due to : No Fall Risks No Fall Risks No Fall Risks  No Fall Risks  Follow up Falls prevention  discussed;Education provided;Falls evaluation completed Falls prevention discussed Falls prevention discussed  Falls prevention discussed     Assessment & Plan Fatigue Fatigue likely multifactorial: stress, potential hypothyroidism, anemia, allergies, possible early depression. - Order blood tests: sed rate, C-reactive protein, vitamin D, kidney and liver function, anemia panel, thyroid panel, cholesterol panel. - Encourage self-care and stress management.  Asthma Moderate persistent asthma with nasal polyps and seasonal allergies. Current treatment ineffective. Discussed switching to Trelegy for better control. - Prescribe Trelegy. - Prescribe azelastine plus fluticasone nasal spray. - Continue loratadine.  Hypothyroidism Managed with levothyroxine. Last thyroid function test normal. - Continue levothyroxine regimen. - Monitor thyroid function with blood tests.  Anemia Anemia likely related to connective tissue disease or chronic disease. Iron studies normal. - Order blood tests to monitor anemia.  Connective Tissue Disease Generic connective tissue disease. Symptoms worsen in winter. Last rheumatologist visit in 2020. - Order inflammatory markers if symptoms worsen. - Consider prednisone taper if symptoms worsen.  Atherosclerosis of the Aorta Atherosclerosis with history of elevated LDL. Discussed importance of managing cholesterol. - Order lipid panel.  Hypoglycemia Hypoglycemia noted during colonoscopy. Discussed potential causes. - Monitor blood sugar levels.  General Health Maintenance Discussed importance of vaccinations: Tdap, pneumococcal, shingles. Explained benefits of PCV20 and Tdap. - Advise obtaining Tdap, PCV20, and shingles vaccines at local pharmacy.

## 2024-02-22 NOTE — Telephone Encounter (Signed)
errenous °

## 2024-02-23 ENCOUNTER — Encounter: Payer: Self-pay | Admitting: Family Medicine

## 2024-02-23 ENCOUNTER — Other Ambulatory Visit: Payer: Self-pay | Admitting: Family Medicine

## 2024-02-23 DIAGNOSIS — E039 Hypothyroidism, unspecified: Secondary | ICD-10-CM

## 2024-02-23 LAB — CBC WITH DIFFERENTIAL/PLATELET
Absolute Lymphocytes: 1791 {cells}/uL (ref 850–3900)
Absolute Monocytes: 270 {cells}/uL (ref 200–950)
Basophils Absolute: 19 {cells}/uL (ref 0–200)
Basophils Relative: 0.5 %
Eosinophils Absolute: 311 {cells}/uL (ref 15–500)
Eosinophils Relative: 8.4 %
HCT: 32.7 % — ABNORMAL LOW (ref 35.0–45.0)
Hemoglobin: 10.3 g/dL — ABNORMAL LOW (ref 11.7–15.5)
MCH: 26.2 pg — ABNORMAL LOW (ref 27.0–33.0)
MCHC: 31.5 g/dL — ABNORMAL LOW (ref 32.0–36.0)
MCV: 83.2 fL (ref 80.0–100.0)
MPV: 11.3 fL (ref 7.5–12.5)
Monocytes Relative: 7.3 %
Neutro Abs: 1310 {cells}/uL — ABNORMAL LOW (ref 1500–7800)
Neutrophils Relative %: 35.4 %
Platelets: 208 10*3/uL (ref 140–400)
RBC: 3.93 10*6/uL (ref 3.80–5.10)
RDW: 13.1 % (ref 11.0–15.0)
Total Lymphocyte: 48.4 %
WBC: 3.7 10*3/uL — ABNORMAL LOW (ref 3.8–10.8)

## 2024-02-23 LAB — COMPREHENSIVE METABOLIC PANEL WITH GFR
AG Ratio: 1.5 (calc) (ref 1.0–2.5)
ALT: 18 U/L (ref 6–29)
AST: 27 U/L (ref 10–35)
Albumin: 4 g/dL (ref 3.6–5.1)
Alkaline phosphatase (APISO): 49 U/L (ref 37–153)
BUN: 12 mg/dL (ref 7–25)
CO2: 29 mmol/L (ref 20–32)
Calcium: 9 mg/dL (ref 8.6–10.4)
Chloride: 105 mmol/L (ref 98–110)
Creat: 0.75 mg/dL (ref 0.50–1.05)
Globulin: 2.6 g/dL (ref 1.9–3.7)
Glucose, Bld: 77 mg/dL (ref 65–99)
Potassium: 3.8 mmol/L (ref 3.5–5.3)
Sodium: 142 mmol/L (ref 135–146)
Total Bilirubin: 0.5 mg/dL (ref 0.2–1.2)
Total Protein: 6.6 g/dL (ref 6.1–8.1)
eGFR: 88 mL/min/{1.73_m2} (ref >=60)

## 2024-02-23 LAB — LIPID PANEL
Cholesterol: 225 mg/dL — ABNORMAL HIGH (ref <200)
HDL: 59 mg/dL (ref >=50)
LDL Cholesterol (Calc): 138 mg/dL — ABNORMAL HIGH
Non-HDL Cholesterol (Calc): 166 mg/dL — ABNORMAL HIGH (ref <130)
Total CHOL/HDL Ratio: 3.8 (calc) (ref <5.0)
Triglycerides: 149 mg/dL (ref <150)

## 2024-02-23 LAB — TSH: TSH: 0.31 m[IU]/L — ABNORMAL LOW (ref 0.40–4.50)

## 2024-02-23 LAB — VITAMIN D 25 HYDROXY (VIT D DEFICIENCY, FRACTURES): Vit D, 25-Hydroxy: 50 ng/mL (ref 30–100)

## 2024-02-23 LAB — C-REACTIVE PROTEIN: CRP: 7.8 mg/L (ref <8.0)

## 2024-02-23 LAB — SEDIMENTATION RATE: Sed Rate: 51 mm/h — ABNORMAL HIGH (ref 0–30)

## 2024-05-14 ENCOUNTER — Ambulatory Visit: Payer: Self-pay

## 2024-05-14 NOTE — Telephone Encounter (Signed)
 FYI Only or Action Required?: FYI only for provider.  Patient was last seen in primary care on 02/22/2024 by Glenard Mire, MD. Called Nurse Triage reporting Otalgia and Headache. Symptoms began several days ago. Interventions attempted: OTC medications: tylenol . Symptoms are: unchanged.  Triage Disposition: See Physician Within 24 Hours  Patient/caregiver understands and will follow disposition?: Yes, will follow disposition  Copied from CRM [id number]. Topic: Clinical - Red Word Triage >> May 14, 2024 11:57 AM Marissa P wrote: Red Word that prompted transfer to Nurse Triage: Patient been having a lot of ear pain, headaches that go down to ear,and jaw hurts. Been going  on for about 3 days now. Reason for Disposition  [1] MODERATE headache (e.g., interferes with normal activities) AND [2] present > 24 hours AND [3] unexplained  (Exceptions: analgesics not tried, typical migraine, or headache part of viral illness)  Answer Assessment - Initial Assessment Questions 1. LOCATION: Where does it hurt?      L side of head 2. ONSET: When did the headache start? (Minutes, hours or days)      3 days 3. PATTERN: Does the pain come and go, or has it been constant since it started?     intermittent 4. SEVERITY: How bad is the pain? and What does it keep you from doing?  (e.g., Scale 1-10; mild, moderate, or severe)   - MILD (1-3): doesn't interfere with normal activities    - MODERATE (4-7): interferes with normal activities or awakens from sleep    - SEVERE (8-10): excruciating pain, unable to do any normal activities        7 5. RECURRENT SYMPTOM: Have you ever had headaches before? If Yes, ask: When was the last time? and What happened that time?      Pt states that she has hx of ear problems, states she has used OTC meds but they are not working 6. CAUSE: What do you think is causing the headache?     unsure 7. MIGRAINE: Have you been diagnosed with migraine headaches? If Yes,  ask: Is this headache similar?      denies 8. HEAD INJURY: Has there been any recent injury to the head?      denies 9. OTHER SYMPTOMS: Do you have any other symptoms? (fever, stiff neck, eye pain, sore throat, cold symptoms)     denies  Protocols used: Headache-A-AH

## 2024-05-15 ENCOUNTER — Encounter: Payer: Self-pay | Admitting: Internal Medicine

## 2024-05-15 ENCOUNTER — Other Ambulatory Visit: Payer: Self-pay

## 2024-05-15 ENCOUNTER — Ambulatory Visit (INDEPENDENT_AMBULATORY_CARE_PROVIDER_SITE_OTHER): Admitting: Internal Medicine

## 2024-05-15 VITALS — BP 110/82 | HR 98 | Temp 97.9°F | Resp 16 | Ht 62.0 in | Wt 110.1 lb

## 2024-05-15 DIAGNOSIS — H6592 Unspecified nonsuppurative otitis media, left ear: Secondary | ICD-10-CM

## 2024-05-15 MED ORDER — AMOXICILLIN-POT CLAVULANATE 875-125 MG PO TABS: 1.0000 | ORAL_TABLET | Freq: Two times a day (BID) | ORAL | 0 refills | Status: AC

## 2024-05-15 MED ORDER — METHYLPREDNISOLONE 4 MG PO TBPK: ORAL_TABLET | ORAL | 0 refills | Status: DC

## 2024-05-15 NOTE — Progress Notes (Signed)
 Acute Office Visit  Subjective:     Patient ID: Miranda Stafford, female    DOB: 12/26/1955, 68 y.o.   MRN: [id number]  Chief Complaint  Patient presents with   Otalgia    For 3 days    Otalgia  Associated symptoms include headaches. Pertinent negatives include no coughing, ear discharge, hearing loss or sore throat.   Patient is in today for ear pain. She is a Dr. Glenard patient and is new to me.   Discussed the use of AI scribe software for clinical note transcription with the patient, who gave verbal consent to proceed.  History of Present Illness  Miranda Stafford is a 68 year old female with chronic sinus issues who presents with headaches and ear symptoms.  She experiences headaches originating from her head and extending to her shoulder, though the shoulder pain has resolved. She feels a sensation of movement in her ear without changes in hearing or tinnitus. She denies fever, acute respiratory symptoms, or dyspnea but feels tired and dizzy, leading her to stay in bed.  She has chronic sinus congestion and has undergone two surgeries for nasal polyps. She uses Zastalin nasal spray for sinus management and takes Tylenol  for her symptoms, though it is ineffective. She applies a heating pad to her ear for relief.  Her medications include inhalers, Claritin , and thyroid  medication. She has not used Breo recently due to cost and found Trelegy too expensive. She is allergic to aspirin and NSAIDs but has no known antibiotic allergies.    Review of Systems  Constitutional:  Positive for malaise/fatigue. Negative for chills and fever.  HENT:  Positive for congestion and ear pain. Negative for ear discharge, hearing loss, sinus pain, sore throat and tinnitus.   Respiratory:  Negative for cough and shortness of breath.   Neurological:  Positive for dizziness and headaches.        Objective:    BP 110/82 (Cuff Size: Small)   Pulse 98   Temp 97.9 F (36.6 C) (Oral)    Resp 16   Ht 5' 2 (1.575 m)   Wt 110 lb 1.6 oz (49.9 kg)   SpO2 98%   BMI 20.14 kg/m    Physical Exam Constitutional:      Appearance: Normal appearance.  HENT:     Head: Normocephalic and atraumatic.     Right Ear: Ear canal and external ear normal.     Left Ear: Ear canal and external ear normal. A middle ear effusion is present. Tympanic membrane is erythematous.     Ears:     Comments: Right mild eustachian tube dysfunction left with OM with effusion    Nose: Congestion present.   Eyes:     Conjunctiva/sclera: Conjunctivae normal.    Cardiovascular:     Rate and Rhythm: Normal rate and regular rhythm.  Pulmonary:     Effort: Pulmonary effort is normal.     Breath sounds: Normal breath sounds.   Skin:    General: Skin is warm and dry.   Neurological:     General: No focal deficit present.     Mental Status: She is alert. Mental status is at baseline.   Psychiatric:        Mood and Affect: Mood normal.        Behavior: Behavior normal.     No results found for any visits on 05/15/24.      Assessment & Plan:   Assessment & Plan  Otitis Media Acute  otitis media with early infection signs, likely related to chronic sinus issues. Discussed potential diarrhea from antibiotics and suggested probiotics or yogurt to mitigate. - Prescribe antibiotics for 5 days. - Prescribe Medrol Dosepak for 6 days. - Advise against inserting anything into the ear. - Instruct to monitor symptoms and follow up with ENT if no improvement.  Chronic Sinusitis with Nasal Polyps Chronic sinusitis with nasal polyps, status post two surgeries. No acute infection, but chronic congestion and sinus pressure persist. Further ENT evaluation may be necessary if symptoms persist. - Continue using Zastalin nasal spray. - Consider follow-up with ENT for further evaluation if symptoms persist.  - methylPREDNISolone (MEDROL DOSEPAK) 4 MG TBPK tablet; Use as directed.  Dispense: 21 each; Refill:  0 - amoxicillin-clavulanate (AUGMENTIN) 875-125 MG tablet; Take 1 tablet by mouth 2 (two) times daily for 5 days.  Dispense: 10 tablet; Refill: 0   Return if symptoms worsen or fail to improve.  Sharyle Fischer, DO

## 2024-08-14 DIAGNOSIS — J301 Allergic rhinitis due to pollen: Secondary | ICD-10-CM | POA: Diagnosis not present

## 2024-08-14 DIAGNOSIS — J329 Chronic sinusitis, unspecified: Secondary | ICD-10-CM | POA: Diagnosis not present

## 2024-08-14 DIAGNOSIS — J330 Polyp of nasal cavity: Secondary | ICD-10-CM | POA: Diagnosis not present

## 2024-08-25 ENCOUNTER — Other Ambulatory Visit: Payer: Self-pay | Admitting: Nurse Practitioner

## 2024-08-25 DIAGNOSIS — E039 Hypothyroidism, unspecified: Secondary | ICD-10-CM

## 2024-08-28 NOTE — Telephone Encounter (Signed)
 Requested medications are due for refill today.  unsure  Requested medications are on the active medications list.  yes  Last refill. 02/22/2024 #90 1 rf  Future visit scheduled.   no  Notes to clinic.  Abnormal labs    Requested Prescriptions  Pending Prescriptions Disp Refills   SYNTHROID  88 MCG tablet [Pharmacy Med Name: SYNTHROID  0.088MG  (88MCG)TABLETS] 90 tablet 1    Sig: TAKE 1 TABLET BY MOUTH DAILY MONDAY THROUGH THURSDAY, AND 1/2 TABLET ON FRIDAY, NO DOSE ON SATURDAY AND SUNDAY     Endocrinology:  Hypothyroid Agents Failed - 08/28/2024 12:20 PM      Failed - TSH in normal range and within 360 days    TSH  Date Value Ref Range Status  02/22/2024 0.31 (L) 0.40 - 4.50 mIU/L Final         Passed - Valid encounter within last 12 months    Recent Outpatient Visits           3 months ago Left otitis media with effusion   [facility] Bernardo Fend, DO   6 months ago Atherosclerosis of aorta   [facility] Glenard Mire, MD

## 2024-09-10 DIAGNOSIS — J330 Polyp of nasal cavity: Secondary | ICD-10-CM | POA: Diagnosis not present

## 2024-09-10 DIAGNOSIS — J301 Allergic rhinitis due to pollen: Secondary | ICD-10-CM | POA: Diagnosis not present

## 2024-10-10 ENCOUNTER — Other Ambulatory Visit: Payer: Self-pay | Admitting: Family Medicine

## 2024-10-10 DIAGNOSIS — E039 Hypothyroidism, unspecified: Secondary | ICD-10-CM

## 2024-10-15 ENCOUNTER — Other Ambulatory Visit: Payer: Self-pay

## 2024-10-15 DIAGNOSIS — E039 Hypothyroidism, unspecified: Secondary | ICD-10-CM

## 2024-10-15 NOTE — Telephone Encounter (Signed)
 Please put in order for the labs and then I will contact patient

## 2024-10-15 NOTE — Telephone Encounter (Signed)
 Pt needs to recheck TSH lab before any more refills, according to labs.

## 2024-10-15 NOTE — Telephone Encounter (Signed)
 3rd request for synthoid

## 2024-10-15 NOTE — Telephone Encounter (Signed)
 done

## 2024-10-23 ENCOUNTER — Ambulatory Visit

## 2024-10-23 ENCOUNTER — Other Ambulatory Visit: Payer: Self-pay | Admitting: Family Medicine

## 2024-10-23 ENCOUNTER — Ambulatory Visit: Payer: Self-pay | Admitting: Family Medicine

## 2024-10-23 VITALS — Ht 62.0 in | Wt 110.0 lb

## 2024-10-23 DIAGNOSIS — Z0000 Encounter for general adult medical examination without abnormal findings: Secondary | ICD-10-CM

## 2024-10-23 DIAGNOSIS — E039 Hypothyroidism, unspecified: Secondary | ICD-10-CM

## 2024-10-23 LAB — TSH: TSH: 1.58 m[IU]/L (ref 0.40–4.50)

## 2024-10-23 MED ORDER — LEVOTHYROXINE SODIUM 75 MCG PO TABS: 75.0000 ug | ORAL_TABLET | Freq: Every day | ORAL | 0 refills | Status: DC

## 2024-10-23 NOTE — Progress Notes (Signed)
 Chief Complaint  Patient presents with   Medicare Wellness     Subjective:   Miranda Stafford is a 68 y.o. female who presents for a Medicare Annual Wellness Visit.  Visit info / Clinical Intake: Medicare Wellness Visit Type:: Initial Annual Wellness Visit Persons participating in visit and providing information:: patient Medicare Wellness Visit Mode:: Video Since this visit was completed virtually, some vitals may be partially provided or unavailable. Missing vitals are due to the limitations of the virtual format.: Unable to obtain vitals - no equipment If Telephone or Video please confirm:: I connected with patient using audio/video enable telemedicine. I verified patient identity with two identifiers, discussed telehealth limitations, and patient agreed to proceed. Patient Location:: Home Provider Location:: Home Interpreter Needed?: No Pre-visit prep was completed: no AWV questionnaire completed by patient prior to visit?: no Living arrangements:: lives with spouse/significant other Patient's Overall Health Status Rating: good Typical amount of pain: some (Sti on left eye) Does pain affect daily life?: no Are you currently prescribed opioids?: no  Dietary Habits and Nutritional Risks How many meals a day?: 2 Eats fruit and vegetables daily?: yes Most meals are obtained by: preparing own meals In the last 2 weeks, have you had any of the following?: none Diabetic:: no  Functional Status Activities of Daily Living (to include ambulation/medication): Independent Ambulation: Independent with device- listed below Home Assistive Devices/Equipment: Eyeglasses Medication Administration: Independent Home Management (perform basic housework or laundry): Independent Manage your own finances?: yes Primary transportation is: driving Concerns about vision?: no *vision screening is required for WTM* Concerns about hearing?: no  Fall Screening Falls in the past year?: 0 Number of  falls in past year: 0 Was there an injury with Fall?: 0 Fall Risk Category Calculator: 0 Patient Fall Risk Level: Low Fall Risk  Fall Risk Patient at Risk for Falls Due to: No Fall Risks Fall risk Follow up: Falls evaluation completed; Falls prevention discussed  Home and Transportation Safety: All rugs have non-skid backing?: N/A, no rugs All stairs or steps have railings?: yes Grab bars in the bathtub or shower?: yes Have non-skid surface in bathtub or shower?: yes Good home lighting?: yes Regular seat belt use?: yes Hospital stays in the last year:: no  Cognitive Assessment Difficulty concentrating, remembering, or making decisions? : no Will 6CIT or Mini Cog be Completed: no 6CIT or Mini Cog Declined: patient alert, oriented, able to answer questions appropriately and recall recent events  Advance Directives (For Healthcare) Does Patient Have a Medical Advance Directive?: No  Reviewed/Updated  Reviewed/Updated: Reviewed All (Medical, Surgical, Family, Medications, Allergies, Care Teams, Patient Goals)    Allergies (verified) Aspirin and Nsaids   Current Medications (verified) Outpatient Encounter Medications as of 10/23/2024  Medication Sig   albuterol  (VENTOLIN  HFA) 108 (90 Base) MCG/ACT inhaler Inhale 2 puffs into the lungs every 4 (four) hours as needed for wheezing or shortness of breath.   Cholecalciferol (VITAMIN D3) 50 MCG (2000 UT) capsule Take 1 capsule (2,000 Units total) by mouth daily.   levothyroxine  (SYNTHROID ) 75 MCG tablet Take 1 tablet (75 mcg total) by mouth daily. Monday  through Friday   loratadine  (CLARITIN ) 10 MG tablet Take 10 mg by mouth daily.   Azelastine -Fluticasone  137-50 MCG/ACT SUSP Place 1 spray into the nose every 12 (twelve) hours. (Patient not taking: Reported on 10/23/2024)   Fluticasone -Umeclidin-Vilant (TRELEGY ELLIPTA ) 100-62.5-25 MCG/ACT AEPB Inhale 1 puff into the lungs daily.   No facility-administered encounter medications on  file as of 10/23/2024.  History: Past Medical History:  Diagnosis Date   Allergy    Anemia    Arthritis    Asthma    Complication of anesthesia    slow to wake   Fibroid tumor    Herpes zoster    Hyperlipidemia    Hypothyroidism    Insomnia    Osteopenia    Thyroid  disease    Wears dentures    partial upper and lower   Past Surgical History:  Procedure Laterality Date   ABDOMINAL HYSTERECTOMY  2005   fibroid tumor   ADENOIDECTOMY     CESAREAN SECTION     COLONOSCOPY WITH PROPOFOL  N/A 10/20/2023   Procedure: COLONOSCOPY WITH PROPOFOL ;  Surgeon: Unk Corinn Skiff, MD;  Location: [facility];  Service: Gastroenterology;  Laterality: N/A;   ETHMOIDECTOMY Bilateral 02/28/2020   Procedure: TOTAL ETHMOIDECTOMY;  Surgeon: Blair Mt, MD;  Location: [facility];  Service: ENT;  Laterality: Bilateral;   FRONTAL SINUS EXPLORATION Bilateral 02/28/2020   Procedure: FRONTAL SINUS EXPLORATION;  Surgeon: Blair Mt, MD;  Location: [facility];  Service: ENT;  Laterality: Bilateral;   IMAGE GUIDED SINUS SURGERY N/A 02/28/2020   Procedure: IMAGE GUIDED SINUS SURGERY;  Surgeon: Blair Mt, MD;  Location: [facility];  Service: ENT;  Laterality: N/A;   MAXILLARY ANTROSTOMY Bilateral 02/28/2020   Procedure: MAXILLARY ANTROSTOMY WITH TISSUE REMOVAL;  Surgeon: Blair Mt, MD;  Location: [facility];  Service: ENT;  Laterality: Bilateral;   SPHENOIDECTOMY Bilateral 02/28/2020   Procedure: CLEONE;  Surgeon: Blair Mt, MD;  Location: [facility];  Service: ENT;  Laterality: Bilateral;   TUBAL LIGATION     Family History  Problem Relation Age of Onset   Heart disease Mother    Hypertension Mother    Hyperlipidemia Mother    SIDS Son    Breast cancer Sister 46   Hypothyroidism Sister    Hypertension Sister    Social History   Occupational History   Occupation: Retired Runner, Broadcasting/film/video   Occupation: [employer] RETIRED  Tobacco Use    Smoking status: Former    Current packs/day: 0.00    Average packs/day: 0.5 packs/day for 5.8 years (2.9 ttl pk-yrs)    Types: Cigarettes    Start date: 11/16/1979    Quit date: 09/18/1985    Years since quitting: 39.1   Smokeless tobacco: Never  Vaping Use   Vaping status: Never Used  Substance and Sexual Activity   Alcohol use: Yes    Alcohol/week: 0.0 standard drinks of alcohol    Comment: wine/socially/rarely   Drug use: No   Sexual activity: Yes    Partners: Male   Tobacco Counseling Counseling given: Not Answered  SDOH Screenings   Food Insecurity: No Food Insecurity (10/23/2024)  Housing: Unknown (10/23/2024)  Transportation Needs: No Transportation Needs (10/23/2024)  Utilities: Not At Risk (10/23/2024)  Alcohol Screen: Low Risk  (10/27/2021)  Depression (PHQ2-9): Low Risk  (10/23/2024)  Financial Resource Strain: Low Risk  (05/30/2023)  Physical Activity: Inactive (10/23/2024)  Social Connections: Socially Integrated (10/23/2024)  Stress: No Stress Concern Present (10/23/2024)  Tobacco Use: Medium Risk (10/23/2024)  Health Literacy: Adequate Health Literacy (10/23/2024)   See flowsheets for full screening details  Depression Screen PHQ 2 & 9 Depression Scale- Over the past 2 weeks, how often have you been bothered by any of the following problems? Little interest or pleasure in doing things: 0 Feeling down, depressed, or hopeless (PHQ Adolescent also includes...irritable): 0 PHQ-2 Total Score: 0 Trouble falling or  staying asleep, or sleeping too much: 0 Feeling tired or having little energy: 0 Poor appetite or overeating (PHQ Adolescent also includes...weight loss): 0 Feeling bad about yourself - or that you are a failure or have let yourself or your family down: 0 Trouble concentrating on things, such as reading the newspaper or watching television (PHQ Adolescent also includes...like school work): 0 Moving or speaking so slowly that other people could have noticed. Or the  opposite - being so fidgety or restless that you have been moving around a lot more than usual: 0 Thoughts that you would be better off dead, or of hurting yourself in some way: 0 PHQ-9 Total Score: 0 If you checked off any problems, how difficult have these problems made it for you to do your work, take care of things at home, or get along with other people?: Not difficult at all  Depression Treatment Depression Interventions/Treatment : EYV7-0 Score <4 Follow-up Not Indicated     Goals Addressed               This Visit's Progress     Patient Stated (pt-stated)        Keep thyroid  in a good range/2025             Objective:    Today's Vitals   10/23/24 1124  Weight: 110 lb (49.9 kg)  Height: 5' 2 (1.575 m)   Body mass index is 20.12 kg/m.  Hearing/Vision screen Hearing Screening - Comments:: Denies hearing difficulties   Vision Screening - Comments:: Wears eyeglasses/Dr. Bell/not UTD Immunizations and Health Maintenance Health Maintenance  Topic Date Due   Pneumococcal Vaccine: 50+ Years (1 of 2 - PCV) Never done   Zoster Vaccines- Shingrix (1 of 2) Never done   DTaP/Tdap/Td (2 - Td or Tdap) 07/03/2022   COVID-19 Vaccine (4 - 2025-26 season) 07/16/2024   Medicare Annual Wellness (AWV)  10/23/2025   Mammogram  01/19/2026   Colonoscopy  10/19/2033   Bone Density Scan  Completed   Hepatitis C Screening  Completed   Meningococcal B Vaccine  Aged Out   Influenza Vaccine  Discontinued        Assessment/Plan:  This is a routine wellness examination for Miranda Stafford.  Patient Care Team: Sowles, Krichna, MD as PCP - General (Family Medicine) Melanee Annah BROCKS, MD as Consulting Physician (Oncology) [facility] Sheppard Lynwood SAUNDERS, DMD (Dentistry) Unk Corinn Skiff, MD as Consulting Physician (Gastroenterology) Blair Mt, MD as Referring Physician (Otolaryngology)  I have personally reviewed and noted the following in the patient's chart:    Medical and social history Use of alcohol, tobacco or illicit drugs  Current medications and supplements including opioid prescriptions. Functional ability and status Nutritional status Physical activity Advanced directives List of other physicians Hospitalizations, surgeries, and ER visits in previous 12 months Vitals Screenings to include cognitive, depression, and falls Referrals and appointments  No orders of the defined types were placed in this encounter.  In addition, I have reviewed and discussed with patient certain preventive protocols, quality metrics, and best practice recommendations. A written personalized care plan for preventive services as well as general preventive health recommendations were provided to patient.   Miranda Stafford, CMA   10/23/2024   Return in 1 year (on 10/23/2025).  After Visit Summary: (MyChart) Due to this being a telephonic visit, the after visit summary with patients personalized plan was offered to patient via MyChart   Nurse Notes: Patient is due for a Tdap and  declines all other due vaccines.  Patient is up to date on all other health maintenance with no concerns to address today.

## 2024-10-23 NOTE — Patient Instructions (Addendum)
 Ms. Heino,  Thank you for taking the time for your Medicare Wellness Visit. I appreciate your continued commitment to your health goals. Please review the care plan we discussed, and feel free to reach out if I can assist you further.  Please note that Annual Wellness Visits do not include a physical exam. Some assessments may be limited, especially if the visit was conducted virtually. If needed, we may recommend an in-person follow-up with your provider.  Ongoing Care Seeing your primary care provider every 3 to 6 months helps us  monitor your health and provide consistent, personalized care. Last office visit on 05/15/2024.  You are due for a tetanus vaccine and can get that at your local pharmacy. Aim for 30 minutes of exercise or brisk walking, 6-8 glasses of water, and 5 servings of fruits and vegetables each day.   Referrals If a referral was made during today's visit and you haven't received any updates within two weeks, please contact the referred provider directly to check on the status.  Recommended Screenings:  Health Maintenance  Topic Date Due   Pneumococcal Vaccine for age over 67 (1 of 2 - PCV) Never done   Zoster (Shingles) Vaccine (1 of 2) Never done   DTaP/Tdap/Td vaccine (2 - Td or Tdap) 07/03/2022   Medicare Annual Wellness Visit  05/29/2024   COVID-19 Vaccine (4 - 2025-26 season) 07/16/2024   Breast Cancer Screening  01/19/2026   Colon Cancer Screening  10/19/2033   Osteoporosis screening with Bone Density Scan  Completed   Hepatitis C Screening  Completed   Meningitis B Vaccine  Aged Out   Flu Shot  Discontinued       10/23/2024   11:29 AM  Advanced Directives  Does Patient Have a Medical Advance Directive? No    Vision: Annual vision screenings are recommended for early detection of glaucoma, cataracts, and diabetic retinopathy. These exams can also reveal signs of chronic conditions such as diabetes and high blood pressure.  Dental: Annual dental  screenings help detect early signs of oral cancer, gum disease, and other conditions linked to overall health, including heart disease and diabetes.  Please see the attached documents for additional preventive care recommendations.

## 2024-11-05 ENCOUNTER — Other Ambulatory Visit: Payer: Self-pay | Admitting: Family Medicine

## 2024-11-05 DIAGNOSIS — E039 Hypothyroidism, unspecified: Secondary | ICD-10-CM

## 2024-11-05 MED ORDER — LEVOTHYROXINE SODIUM 75 MCG PO TABS: 75.0000 ug | ORAL_TABLET | Freq: Every day | ORAL | 0 refills | Status: DC

## 2024-11-07 ENCOUNTER — Other Ambulatory Visit: Payer: Self-pay

## 2024-11-07 DIAGNOSIS — E039 Hypothyroidism, unspecified: Secondary | ICD-10-CM

## 2024-11-07 MED ORDER — LEVOTHYROXINE SODIUM 75 MCG PO TABS: 75.0000 ug | ORAL_TABLET | Freq: Every day | ORAL | 0 refills | Status: AC

## 2024-12-18 ENCOUNTER — Ambulatory Visit: Payer: Self-pay | Admitting: Family Medicine

## 2024-12-18 ENCOUNTER — Other Ambulatory Visit: Payer: Self-pay | Admitting: Family Medicine

## 2024-12-18 DIAGNOSIS — J4540 Moderate persistent asthma, uncomplicated: Secondary | ICD-10-CM

## 2024-12-18 DIAGNOSIS — J4520 Mild intermittent asthma, uncomplicated: Secondary | ICD-10-CM

## 2024-12-18 NOTE — Telephone Encounter (Signed)
 Discontinued by provider on 02/22/24, refusing due to this.     Requested Prescriptions  Pending Prescriptions Disp Refills   BREO ELLIPTA  100-25 MCG/ACT AEPB [Pharmacy Med Name: BREO ELLIPTA  100-25MCG ORAL INH(30)] 60 each     Sig: INHALE 1 PUFF INTO THE LUNGS DAILY     Pulmonology:  Combination Products Passed - 12/18/2024  3:53 PM      Passed - Valid encounter within last 12 months    Recent Outpatient Visits           7 months ago Left otitis media with effusion   [facility] Bernardo Fend, DO   10 months ago Atherosclerosis of aorta   [facility] Glenard Mire, MD

## 2024-12-18 NOTE — Telephone Encounter (Signed)
 FYI Only or Action Required?: Action required by provider: update on patient condition and asking for refills of both albuterol  and Breo inhalers.  Patient was last seen in primary care on 05/15/2024 by Bernardo Fend, DO.  Called Nurse Triage reporting Shortness of Breath.  Symptoms began several days ago.  Interventions attempted: Rest, hydration, or home remedies.  Symptoms are: gradually worsening.  Triage Disposition: No disposition on file.  Patient/caregiver understands and will follow disposition?:   Message from Pauline F sent at 12/18/2024  3:21 PM EST  Reason for Triage: Patient is having difficulty breathing, patient says it feels like she is going to have an asthma attack   Answer Assessment - Initial Assessment Questions Patient having increase SOB with activity for the last 2-3days. Today she was at the store and started to feel light headed and overheated. She had to sit down to rest and recovered in a few minutes. Dizzy resolved.  Does not have access to pulse ox monitor States she feels this way when she is going to have an asthma attack soon. Understands ED/UC if unable to get inhaler. Advised EMS for assessment with any worsening.  Patient needs a refill on her Albuterol  and her Breo inhalers- used the last of the breo and out of albuterol . Pharmacy confirmed.   1. RESPIRATORY STATUS: Describe your breathing? (e.g., wheezing, shortness of breath, unable to speak, severe coughing)      Sob with activity 2. ONSET: When did this breathing problem begin?      Couple days but now worse 3. PATTERN Does the difficult breathing come and go, or has it been constant since it started?      Worsened with activity 4. SEVERITY: How bad is your breathing? (e.g., mild, moderate, severe)      Moderate 5. RECURRENT SYMPTOM: Have you had difficulty breathing before? If Yes, ask: When was the last time? and What happened that time?      asthma 6. CARDIAC HISTORY:  Do you have any history of heart disease? (e.g., heart attack, angina, bypass surgery, angioplasty)      Aortic athero 7. LUNG HISTORY: Do you have any history of lung disease?  (e.g., pulmonary embolus, asthma, emphysema)     Asthma and allergies  8. CAUSE: What do you think is causing the breathing problem?      Asthma trigger 9. OTHER SYMPTOMS: Do you have any other symptoms? (e.g., chest pain, cough, dizziness, fever, runny nose)     dizziness 10. O2 SATURATION MONITOR:  Do you use an oxygen saturation monitor (pulse oximeter) at home? If Yes, ask: What is your reading (oxygen level) today? What is your usual oxygen saturation reading? (e.g., 95%)       Doesn't have a monitor  Protocols used: Breathing Difficulty-A-AH

## 2024-12-18 NOTE — Telephone Encounter (Signed)
 Lft vm to schedule an appointment

## 2024-12-19 ENCOUNTER — Ambulatory Visit: Admitting: Family Medicine

## 2024-12-19 ENCOUNTER — Encounter: Payer: Self-pay | Admitting: Family Medicine

## 2024-12-19 VITALS — BP 110/70 | HR 68 | Resp 16 | Ht 62.0 in | Wt 112.6 lb

## 2024-12-19 DIAGNOSIS — D708 Other neutropenia: Secondary | ICD-10-CM | POA: Diagnosis not present

## 2024-12-19 DIAGNOSIS — J3489 Other specified disorders of nose and nasal sinuses: Secondary | ICD-10-CM

## 2024-12-19 DIAGNOSIS — J4541 Moderate persistent asthma with (acute) exacerbation: Secondary | ICD-10-CM

## 2024-12-19 DIAGNOSIS — D638 Anemia in other chronic diseases classified elsewhere: Secondary | ICD-10-CM

## 2024-12-19 DIAGNOSIS — M359 Systemic involvement of connective tissue, unspecified: Secondary | ICD-10-CM | POA: Diagnosis not present

## 2024-12-19 DIAGNOSIS — I700 Atherosclerosis of aorta: Secondary | ICD-10-CM | POA: Diagnosis not present

## 2024-12-19 DIAGNOSIS — E7800 Pure hypercholesterolemia, unspecified: Secondary | ICD-10-CM | POA: Insufficient documentation

## 2024-12-19 DIAGNOSIS — E538 Deficiency of other specified B group vitamins: Secondary | ICD-10-CM | POA: Diagnosis not present

## 2024-12-19 DIAGNOSIS — E039 Hypothyroidism, unspecified: Secondary | ICD-10-CM

## 2024-12-19 DIAGNOSIS — J339 Nasal polyp, unspecified: Secondary | ICD-10-CM | POA: Diagnosis not present

## 2024-12-19 DIAGNOSIS — E559 Vitamin D deficiency, unspecified: Secondary | ICD-10-CM | POA: Diagnosis not present

## 2024-12-19 MED ORDER — BUDESONIDE-FORMOTEROL FUMARATE 160-4.5 MCG/ACT IN AERO: 2.0000 | INHALATION_SPRAY | Freq: Two times a day (BID) | RESPIRATORY_TRACT | 3 refills | Status: AC

## 2024-12-19 MED ORDER — ALBUTEROL SULFATE HFA 108 (90 BASE) MCG/ACT IN AERS: 2.0000 | INHALATION_SPRAY | RESPIRATORY_TRACT | 0 refills | Status: AC | PRN

## 2024-12-19 MED ORDER — FLUTICASONE PROPIONATE 50 MCG/ACT NA SUSP: 2.0000 | Freq: Every day | NASAL | 1 refills | Status: AC

## 2024-12-19 MED ORDER — PREDNISONE 10 MG PO TABS: 10.0000 mg | ORAL_TABLET | Freq: Two times a day (BID) | ORAL | 0 refills | Status: AC

## 2024-12-19 MED ORDER — AZELASTINE HCL 0.1 % NA SOLN: 2.0000 | Freq: Two times a day (BID) | NASAL | 1 refills | Status: AC

## 2024-12-19 NOTE — Progress Notes (Signed)
 Name: Miranda Stafford   MRN: [id number]    DOB: 22-Aug-1956   Date:12/19/2024       Progress Note  Subjective  Chief Complaint  Chief Complaint  Patient presents with   Shortness of Breath    A few days, feel out of breath for small movements   Discussed the use of AI scribe software for clinical note transcription with the patient, who gave verbal consent to proceed.  History of Present Illness Miranda Stafford is a 69 year old female with asthma and nasal polyps who presents with shortness of breath.  She has been experiencing shortness of breath, which began a few days ago and has worsened with activities such as climbing stairs and taking a shower. She had an asthma attack yesterday and used an old albuterol  inhaler with only two puffs remaining to manage the symptoms. There was no preceding cold, but she has ongoing nasal and throat polyps causing congestion. She feels 'really tired' and experiences dizziness, which she associates with her asthma.  She has a history of asthma and uses albuterol  as needed. Financial constraints affect her ability to purchase certain medications, such as Nutrilyte, which she finds expensive.  She has a history of hypothyroidism and is currently taking a generic thyroid  medication, 75 micrograms, Monday through Friday. Her TSH was last checked in December and was within normal range. No issues with the medication or symptoms like hair loss or constipation.  She has a history of anemia and leukopenia, with previous evaluations by a hematologist indicating the need for monitoring. She is concerned about her current health status and requests a check-up.  She has a history of connective tissue disease and Raynaud's phenomenon, with previous normal inflammatory markers. She uses gloves to manage Raynaud's symptoms and takes vitamin D  and K supplements. She is not currently taking medication for her atherosclerosis of the aorta or hypercholesterolemia.  She  has nasal polyps and previously used azelastine  and fluticasone  nasal sprays, but stopped due to prescription issues. She experiences ongoing nasal congestion and difficulty breathing through her nose.    Patient Active Problem List   Diagnosis Date Noted   Anemia of chronic disease 02/22/2024   Vitamin D  deficiency 02/22/2024   Colon cancer screening 10/20/2023   Atherosclerosis of aorta 06/22/2022   Skin sensation disturbance 05/05/2021   Connective tissue disease 12/19/2019   Other neutropenia 01/10/2018   Allergic rhinitis due to allergen 08/03/2017   Low serum vitamin B12 02/25/2017   Family history of breast cancer in sister 05/24/2016   Hypothyroidism, adult 09/19/2015   Moderate persistent asthma without complication 09/19/2015   Perennial allergic rhinitis with seasonal variation 09/19/2015    Past Surgical History:  Procedure Laterality Date   ABDOMINAL HYSTERECTOMY  2005   fibroid tumor   ADENOIDECTOMY     CESAREAN SECTION     COLONOSCOPY WITH PROPOFOL  N/A 10/20/2023   Procedure: COLONOSCOPY WITH PROPOFOL ;  Surgeon: Unk Corinn Skiff, MD;  Location: [facility];  Service: Gastroenterology;  Laterality: N/A;   ETHMOIDECTOMY Bilateral 02/28/2020   Procedure: TOTAL ETHMOIDECTOMY;  Surgeon: Blair Mt, MD;  Location: [facility];  Service: ENT;  Laterality: Bilateral;   FRONTAL SINUS EXPLORATION Bilateral 02/28/2020   Procedure: FRONTAL SINUS EXPLORATION;  Surgeon: Blair Mt, MD;  Location: [facility];  Service: ENT;  Laterality: Bilateral;   IMAGE GUIDED SINUS SURGERY N/A 02/28/2020   Procedure: IMAGE GUIDED SINUS SURGERY;  Surgeon: Blair Mt, MD;  Location: [facility];  Service: ENT;  Laterality: N/A;   MAXILLARY ANTROSTOMY Bilateral 02/28/2020   Procedure: MAXILLARY ANTROSTOMY WITH TISSUE REMOVAL;  Surgeon: Blair Mt, MD;  Location: [facility];  Service: ENT;  Laterality: Bilateral;   SPHENOIDECTOMY Bilateral 02/28/2020    Procedure: CLEONE;  Surgeon: Blair Mt, MD;  Location: [facility];  Service: ENT;  Laterality: Bilateral;   TUBAL LIGATION      Family History  Problem Relation Age of Onset   Heart disease Mother    Hypertension Mother    Hyperlipidemia Mother    SIDS Son    Breast cancer Sister 21   Hypothyroidism Sister    Hypertension Sister     Social History   Tobacco Use   Smoking status: Former    Current packs/day: 0.00    Average packs/day: 0.5 packs/day for 5.8 years (2.9 ttl pk-yrs)    Types: Cigarettes    Start date: 11/16/1979    Quit date: 09/18/1985    Years since quitting: 39.2   Smokeless tobacco: Never  Substance Use Topics   Alcohol use: Yes    Alcohol/week: 0.0 standard drinks of alcohol    Comment: wine/socially/rarely    Current Medications[1]  Allergies[2]  I personally reviewed active problem list, medication list, allergies, family history with the patient/caregiver today.   ROS  Ten systems reviewed and is negative except as mentioned in HPI    Objective Physical Exam CONSTITUTIONAL: Patient appears well-developed and well-nourished. No distress. HEENT: Head atraumatic, normocephalic, neck supple. Pharynx normal. Nasal polyps present. CARDIOVASCULAR: Normal rate, regular rhythm and normal heart sounds. No murmur heard. No BLE edema. PULMONARY: Effort normal and breath sounds normal. No respiratory distress. Lungs clear to auscultation, no wheezing, crackles, or rhonchi. ABDOMINAL: There is no tenderness or distention. MUSCULOSKELETAL: Normal gait. Without gross motor or sensory deficit. PSYCHIATRIC: Patient has a normal mood and affect. Behavior is normal. Judgment and thought content normal.  Vitals:   12/19/24 1028  BP: 110/70  Pulse: 68  Resp: 16  Weight: 112 lb 9.6 oz (51.1 kg)  Height: 5' 2 (1.575 m)    Body mass index is 20.59 kg/m.  Recent Results (from the past 2160 hours)  TSH     Status: None   Collection Time:  10/22/24  9:49 AM  Result Value Ref Range   TSH 1.58 0.40 - 4.50 mIU/L    PHQ2/9:    12/19/2024   10:28 AM 10/23/2024   11:35 AM 02/22/2024    8:10 AM 10/05/2023    2:38 PM 05/30/2023    3:22 PM  Depression screen PHQ 2/9  Decreased Interest 0 0 0 0 0  Down, Depressed, Hopeless 0 0 0 0 0  PHQ - 2 Score 0 0 0 0 0  Altered sleeping 0 0 0 0 0  Tired, decreased energy 0 0 0 0 0  Change in appetite 0 0 0 0 0  Feeling bad or failure about yourself  0 0 0 0 0  Trouble concentrating 0 0 0 0 0  Moving slowly or fidgety/restless 0 0 0 0 0  Suicidal thoughts 0 0 0 0 0  PHQ-9 Score 0 0 0  0  0   Difficult doing work/chores Not difficult at all Not difficult at all Not difficult at all       Data saved with a previous flowsheet row definition    phq 9 is negative  Fall Risk:    12/19/2024   10:28 AM 10/23/2024   11:29 AM 02/22/2024  8:10 AM 10/05/2023    2:38 PM 05/30/2023    3:22 PM  Fall Risk   Falls in the past year? 0 0 0 0 0  Number falls in past yr: 0 0 0 0 0  Injury with Fall? 0 0 0  0  0   Risk for fall due to : No Fall Risks No Fall Risks No Fall Risks No Fall Risks No Fall Risks  Follow up Falls evaluation completed Falls evaluation completed;Falls prevention discussed Falls prevention discussed;Education provided;Falls evaluation completed Falls prevention discussed Falls prevention discussed     Data saved with a previous flowsheet row definition     Assessment & Plan Moderate persistent asthma with acute exacerbation Albuterol  provides temporary relief but does not address inflammation. - Prescribed Symbicort  (budesonide /formoterol ) four times a day as needed. - Prescribed prednisone  twice a day for five days, with food, not before bedtime. - Prescribed albuterol  inhaler for immediate relief.  Nasal polyps with chronic nasal congestion Chronic nasal congestion due to nasal polyps, causing difficulty breathing through the nose. - Prescribed Flonase  (fluticasone ) nasal  spray, one squirt in each nostril morning and night. - Prescribed Astelin  (azelastine ) nasal spray, one squirt in each nostril after Flonase .  Adult hypothyroidism Recent TSH level 1.58 indicates good control. - Rechecked TSH level today.  Anemia of chronic disease and neutropenia Long-standing anemia and leukopenia, previously evaluated by hematology with no significant findings. - Ordered complete blood count to monitor anemia and leukopenia.  Connective tissue disease with Raynaud's phenomenon - Continue vitamin D  and K supplementation.  Atherosclerosis of aorta Atherosclerosis of the aorta. No current treatment for hypercholesterolemia due to patient preference.  Hypercholesterolemia Elevated LDL cholesterol, normal HDL and triglycerides. No current medication for cholesterol management. - Ordered lipid panel to monitor cholesterol levels.  Vitamin D  and B12 deficiency Previously low vitamin D  and B12 levels, managed with supplementation. - Ordered vitamin D  and B12 levels to monitor supplementation effectiveness.        [1]  Current Outpatient Medications:    albuterol  (VENTOLIN  HFA) 108 (90 Base) MCG/ACT inhaler, Inhale 2 puffs into the lungs every 4 (four) hours as needed for wheezing or shortness of breath., Disp: 18 g, Rfl: 0   Cholecalciferol (VITAMIN D3) 50 MCG (2000 UT) capsule, Take 1 capsule (2,000 Units total) by mouth daily., Disp: 100 capsule, Rfl: 1   Fluticasone -Umeclidin-Vilant (TRELEGY ELLIPTA ) 100-62.5-25 MCG/ACT AEPB, Inhale 1 puff into the lungs daily., Disp: 1 each, Rfl: 5   levothyroxine  (SYNTHROID ) 75 MCG tablet, Take 1 tablet (75 mcg total) by mouth daily. Monday  through Friday, Disp: 60 tablet, Rfl: 0   loratadine  (CLARITIN ) 10 MG tablet, Take 10 mg by mouth daily., Disp: , Rfl:    Azelastine -Fluticasone  137-50 MCG/ACT SUSP, Place 1 spray into the nose every 12 (twelve) hours. (Patient not taking: Reported on 12/19/2024), Disp: 23 g, Rfl: 5 [2]   Allergies Allergen Reactions   Aspirin Nausea And Vomiting   Nsaids     Nausea and vomiting

## 2024-12-20 ENCOUNTER — Ambulatory Visit: Payer: Self-pay | Admitting: Family Medicine

## 2024-12-20 LAB — COMPREHENSIVE METABOLIC PANEL WITH GFR
AG Ratio: 1.8 (calc) (ref 1.0–2.5)
ALT: 9 U/L (ref 6–29)
AST: 22 U/L (ref 10–35)
Albumin: 4.4 g/dL (ref 3.6–5.1)
Alkaline phosphatase (APISO): 61 U/L (ref 37–153)
BUN: 12 mg/dL (ref 7–25)
CO2: 27 mmol/L (ref 20–32)
Calcium: 9.1 mg/dL (ref 8.6–10.4)
Chloride: 104 mmol/L (ref 98–110)
Creat: 0.8 mg/dL (ref 0.50–1.05)
Globulin: 2.5 g/dL (ref 1.9–3.7)
Glucose, Bld: 87 mg/dL (ref 65–99)
Potassium: 4 mmol/L (ref 3.5–5.3)
Sodium: 138 mmol/L (ref 135–146)
Total Bilirubin: 0.5 mg/dL (ref 0.2–1.2)
Total Protein: 6.9 g/dL (ref 6.1–8.1)
eGFR: 81 mL/min/{1.73_m2}

## 2024-12-20 LAB — CBC WITH DIFFERENTIAL/PLATELET
Absolute Lymphocytes: 1776 {cells}/uL (ref 850–3900)
Absolute Monocytes: 200 {cells}/uL (ref 200–950)
Basophils Absolute: 30 {cells}/uL (ref 0–200)
Basophils Relative: 0.8 %
Eosinophils Absolute: 340 {cells}/uL (ref 15–500)
Eosinophils Relative: 9.2 %
HCT: 32.6 % — ABNORMAL LOW (ref 35.9–46.0)
Hemoglobin: 10.2 g/dL — ABNORMAL LOW (ref 11.7–15.5)
MCH: 25.9 pg — ABNORMAL LOW (ref 27.0–33.0)
MCHC: 31.3 g/dL — ABNORMAL LOW (ref 31.6–35.4)
MCV: 82.7 fL (ref 81.4–101.7)
MPV: 11 fL (ref 7.5–12.5)
Monocytes Relative: 5.4 %
Neutro Abs: 1354 {cells}/uL — ABNORMAL LOW (ref 1500–7800)
Neutrophils Relative %: 36.6 %
Platelets: 218 10*3/uL (ref 140–400)
RBC: 3.94 Million/uL (ref 3.80–5.10)
RDW: 13.2 % (ref 11.0–15.0)
Total Lymphocyte: 48 %
WBC: 3.7 10*3/uL — ABNORMAL LOW (ref 3.8–10.8)

## 2024-12-20 LAB — TSH: TSH: 0.65 m[IU]/L (ref 0.40–4.50)

## 2024-12-20 LAB — LIPID PANEL
Cholesterol: 215 mg/dL — ABNORMAL HIGH
HDL: 65 mg/dL
LDL Cholesterol (Calc): 130 mg/dL — ABNORMAL HIGH
Non-HDL Cholesterol (Calc): 150 mg/dL — ABNORMAL HIGH
Total CHOL/HDL Ratio: 3.3 (calc)
Triglycerides: 96 mg/dL

## 2024-12-20 LAB — VITAMIN D 25 HYDROXY (VIT D DEFICIENCY, FRACTURES): Vit D, 25-Hydroxy: 40 ng/mL (ref 30–100)

## 2024-12-20 LAB — B12 AND FOLATE PANEL
Folate: 12.8 ng/mL
Vitamin B-12: 462 pg/mL (ref 200–1100)
